# Patient Record
Sex: Female | Born: 1970 | Race: Black or African American | Hispanic: No | Marital: Single | Smoking: Former smoker
Health system: Southern US, Community
[De-identification: ages and names within clinical notes are randomized; demographics above are authoritative.]

## PROBLEM LIST (undated history)

## (undated) ENCOUNTER — Ambulatory Visit (HOSPITAL_COMMUNITY): Admission: EM | Payer: Medicaid Other | Source: Home / Self Care

## (undated) ENCOUNTER — Ambulatory Visit (HOSPITAL_COMMUNITY): Payer: Self-pay

## (undated) DIAGNOSIS — F32A Depression, unspecified: Secondary | ICD-10-CM

## (undated) DIAGNOSIS — F209 Schizophrenia, unspecified: Secondary | ICD-10-CM

## (undated) DIAGNOSIS — F419 Anxiety disorder, unspecified: Secondary | ICD-10-CM

## (undated) DIAGNOSIS — E119 Type 2 diabetes mellitus without complications: Secondary | ICD-10-CM

## (undated) DIAGNOSIS — J45909 Unspecified asthma, uncomplicated: Secondary | ICD-10-CM

## (undated) DIAGNOSIS — F329 Major depressive disorder, single episode, unspecified: Secondary | ICD-10-CM

## (undated) DIAGNOSIS — E7800 Pure hypercholesterolemia, unspecified: Secondary | ICD-10-CM

## (undated) DIAGNOSIS — K469 Unspecified abdominal hernia without obstruction or gangrene: Secondary | ICD-10-CM

---

## 2000-03-21 HISTORY — PX: TUBAL LIGATION: SHX77

## 2008-03-21 HISTORY — PX: FINGER FRACTURE SURGERY: SHX638

## 2013-03-18 ENCOUNTER — Encounter (HOSPITAL_COMMUNITY): Payer: Self-pay | Admitting: Emergency Medicine

## 2013-03-18 ENCOUNTER — Emergency Department (HOSPITAL_COMMUNITY): Payer: Medicaid Other

## 2013-03-18 ENCOUNTER — Emergency Department (HOSPITAL_COMMUNITY)
Admission: EM | Admit: 2013-03-18 | Discharge: 2013-03-18 | Disposition: A | Discharge status: Home / Self Care | Payer: Medicaid Other | Attending: Emergency Medicine | Admitting: Emergency Medicine

## 2013-03-18 DIAGNOSIS — J45901 Unspecified asthma with (acute) exacerbation: Secondary | ICD-10-CM | POA: Insufficient documentation

## 2013-03-18 DIAGNOSIS — IMO0001 Reserved for inherently not codable concepts without codable children: Secondary | ICD-10-CM | POA: Insufficient documentation

## 2013-03-18 DIAGNOSIS — Z8719 Personal history of other diseases of the digestive system: Secondary | ICD-10-CM | POA: Insufficient documentation

## 2013-03-18 DIAGNOSIS — J111 Influenza due to unidentified influenza virus with other respiratory manifestations: Secondary | ICD-10-CM | POA: Insufficient documentation

## 2013-03-18 DIAGNOSIS — F17200 Nicotine dependence, unspecified, uncomplicated: Secondary | ICD-10-CM | POA: Insufficient documentation

## 2013-03-18 DIAGNOSIS — R197 Diarrhea, unspecified: Secondary | ICD-10-CM | POA: Insufficient documentation

## 2013-03-18 DIAGNOSIS — Z79899 Other long term (current) drug therapy: Secondary | ICD-10-CM | POA: Insufficient documentation

## 2013-03-18 DIAGNOSIS — Z880 Allergy status to penicillin: Secondary | ICD-10-CM | POA: Insufficient documentation

## 2013-03-18 HISTORY — DX: Unspecified asthma, uncomplicated: J45.909

## 2013-03-18 HISTORY — DX: Unspecified abdominal hernia without obstruction or gangrene: K46.9

## 2013-03-18 MED ORDER — ALBUTEROL SULFATE (5 MG/ML) 0.5% IN NEBU
5.0000 mg | INHALATION_SOLUTION | Freq: Once | RESPIRATORY_TRACT | Status: AC
  Administered 2013-03-18: 5 mg via RESPIRATORY_TRACT
  Filled 2013-03-18: qty 1

## 2013-03-18 MED ORDER — KETOROLAC TROMETHAMINE 60 MG/2ML IM SOLN
60.0000 mg | Freq: Once | INTRAMUSCULAR | Status: AC
  Administered 2013-03-18: 60 mg via INTRAMUSCULAR
  Filled 2013-03-18: qty 2

## 2013-03-18 MED ORDER — ONDANSETRON 4 MG PO TBDP
8.0000 mg | ORAL_TABLET | Freq: Once | ORAL | Status: AC
  Administered 2013-03-18: 8 mg via ORAL
  Filled 2013-03-18: qty 2

## 2013-03-18 MED ORDER — IPRATROPIUM BROMIDE 0.02 % IN SOLN
0.5000 mg | Freq: Once | RESPIRATORY_TRACT | Status: AC
  Administered 2013-03-18: 0.5 mg via RESPIRATORY_TRACT
  Filled 2013-03-18: qty 2.5

## 2013-03-18 MED ORDER — NAPROXEN 500 MG PO TABS: 500.0000 mg | ORAL_TABLET | Freq: Two times a day (BID) | ORAL | Status: DC

## 2013-03-18 MED ORDER — ALBUTEROL SULFATE HFA 108 (90 BASE) MCG/ACT IN AERS
2.0000 | INHALATION_SPRAY | RESPIRATORY_TRACT | Status: DC | PRN
  Administered 2013-03-18: 2 via RESPIRATORY_TRACT
  Filled 2013-03-18: qty 6.7

## 2013-03-18 MED ORDER — BENZONATATE 100 MG PO CAPS: 100.0000 mg | ORAL_CAPSULE | Freq: Three times a day (TID) | ORAL | Status: DC

## 2013-03-18 NOTE — ED Notes (Signed)
Pt. Stated, I've had a fever with diarrhea since Christmas day

## 2013-03-18 NOTE — ED Provider Notes (Signed)
CSN: [id number]     Arrival date & time 03/18/13  1028 History   First MD Initiated Contact with Patient 03/18/13 1102     Chief Complaint  Patient presents with  . Abdominal Pain  . Diarrhea   (Consider location/radiation/quality/duration/timing/severity/associated sxs/prior Treatment) HPI Comments: Patient with history of asthma -- presents with complaints of myalgia, malaise, runny nose, cough, intermittent fevers starting 5 days ago. She continues to feel poorly and does not have any energy. She has been using her albuterol inhaler more frequently and is currently out. She's been using other over-the-counter cold and flu medication without much improvement. Patient states that she feels nauseous and vomits every time she eats something. She denies abdominal pain and diarrhea to me (conflicts with triage note). No urinary changes or dysuria. She has not had a flu shot this year. The onset of this condition was acute. The course is constant. Aggravating factors: none. Alleviating factors: none.    Patient is a 42 y.o. female presenting with abdominal pain and diarrhea. The history is provided by the patient.  Abdominal Pain Associated symptoms: chills, cough, fatigue, fever, nausea and vomiting   Associated symptoms: no diarrhea, no dysuria and no sore throat   Diarrhea Associated symptoms: chills, fever, myalgias and vomiting   Associated symptoms: no abdominal pain and no headaches     Past Medical History  Diagnosis Date  . Asthma   . Hernia    History reviewed. No pertinent past surgical history. No family history on file. History  Substance Use Topics  . Smoking status: Current Some Day Smoker  . Smokeless tobacco: Not on file  . Alcohol Use: Yes   OB History   Grav Para Term Preterm Abortions TAB SAB Ect Mult Living                 Review of Systems  Constitutional: Positive for fever, chills and fatigue.  HENT: Positive for congestion and rhinorrhea. Negative for  ear pain, sinus pressure and sore throat.   Eyes: Negative for redness.  Respiratory: Positive for cough and wheezing.   Cardiovascular: Negative for leg swelling.  Gastrointestinal: Positive for nausea and vomiting. Negative for abdominal pain and diarrhea.  Genitourinary: Negative for dysuria and decreased urine volume.  Musculoskeletal: Positive for myalgias. Negative for neck stiffness.  Skin: Negative for rash.  Neurological: Negative for headaches.  Hematological: Negative for adenopathy.    Allergies  Penicillins  Home Medications   Current Outpatient Rx  Name  Route  Sig  Dispense  Refill  . Phenylephrine-DM-GG-APAP (TYLENOL COLD/FLU SEVERE) 5-10-200-325 MG TABS   Oral   Take 2 tablets by mouth every 6 (six) hours as needed (for cold symptoms).         . QUEtiapine (SEROQUEL) 300 MG tablet   Oral   Take 300 mg by mouth 2 (two) times daily.          BP 105/62  Pulse 89  Temp(Src) 98.2 F (36.8 C) (Oral)  Resp 16  Wt 154 lb 3.2 oz (69.945 kg)  SpO2 98%  LMP 03/01/2013 Physical Exam  Nursing note and vitals reviewed. Constitutional: She appears well-developed and well-nourished.  HENT:  Head: Normocephalic and atraumatic.  Right Ear: Tympanic membrane, external ear and ear canal normal.  Left Ear: Tympanic membrane, external ear and ear canal normal.  Nose: Mucosal edema present. No rhinorrhea.  Mouth/Throat: Uvula is midline, oropharynx is clear and moist and mucous membranes are normal. Mucous membranes are not dry. No  oral lesions. No trismus in the jaw. No uvula swelling. No oropharyngeal exudate, posterior oropharyngeal edema, posterior oropharyngeal erythema or tonsillar abscesses.  Eyes: Conjunctivae are normal. Right eye exhibits no discharge. Left eye exhibits no discharge.  Neck: Normal range of motion. Neck supple.  Cardiovascular: Normal rate, regular rhythm and normal heart sounds.   Pulmonary/Chest: Effort normal and breath sounds normal. No  respiratory distress. She has no wheezes. She has no rales.  Abdominal: Soft. There is no tenderness.  Lymphadenopathy:    She has no cervical adenopathy.  Neurological: She is alert.  Skin: Skin is warm and dry.  Psychiatric: She has a normal mood and affect.    ED Course  Procedures (including critical care time) Labs Review Labs Reviewed - No data to display Imaging Review Dg Chest 2 View  03/18/2013   CLINICAL DATA:  Cough, fever, history of asthma  EXAM: CHEST  2 VIEW  COMPARISON:  None.  FINDINGS: Cardiomediastinal silhouette is unremarkable. No acute infiltrate or pleural effusion. No pulmonary edema. Mild degenerative changes thoracic spine.  IMPRESSION: No active cardiopulmonary disease.   Electronically Signed   By: Natasha Mead M.D.   On: 03/18/2013 12:08    EKG Interpretation   None      11:36 AM Patient seen and examined. Work-up initiated. Medications ordered. She is well appearing. Will check CXR given history of asthma. Will give breathing treatment and oral fluids. Pt d/w Dr. Fayrene Fearing.   Vital signs reviewed and are as follows: Filed Vitals:   03/18/13 1051  BP: 105/62  Pulse: 89  Temp: 98.2 F (36.8 C)  Resp: 16   1:06 PM Chest x-ray reviewed. Patient reexamined. Unchanged, lungs clear. Patient informed of all results.   Will d/c with albuterol inhaler, tessalon, naproxen. Patient counseled on use of albuterol HFA.  Told to use 1-2 puffs q 4 hours as needed for SOB.  Patient discharged to home. Encouraged to rest and drink plenty of fluids.  Patient told to return to ED or see their primary doctor if their symptoms worsen, high fever not controlled with tylenol, persistent vomiting, they feel they are dehydrated, or if they have any other concerns.  Patient verbalized understanding and agreed with plan.     MDM   1. Influenza-like illness    Patient with symptoms consistent with influenza. CXR neg, no PNA. Vitals are stable. Pt well-appearing, non-toxic. No  signs of dehydration, tolerating PO's.  Lungs are clear.  Supportive therapy indicated with return if symptoms worsen.  Patient counseled.     Renne Crigler, PA-C 03/18/13 1309

## 2013-03-20 NOTE — ED Provider Notes (Signed)
Medical screening examination/treatment/procedure(s) were performed by non-physician practitioner and as supervising physician I was immediately available for consultation/collaboration.  EKG Interpretation   None         Rolland Porter, MD 03/20/13 [phone]

## 2013-03-28 ENCOUNTER — Other Ambulatory Visit: Payer: Self-pay | Admitting: Nurse Practitioner

## 2013-03-28 DIAGNOSIS — Z1231 Encounter for screening mammogram for malignant neoplasm of breast: Secondary | ICD-10-CM

## 2013-04-02 ENCOUNTER — Ambulatory Visit: Payer: Medicaid Other

## 2013-04-03 ENCOUNTER — Ambulatory Visit
Admission: RE | Admit: 2013-04-03 | Discharge: 2013-04-03 | Disposition: A | Discharge status: Home / Self Care | Payer: Medicaid Other | Source: Ambulatory Visit | Attending: Nurse Practitioner | Admitting: Nurse Practitioner

## 2013-04-03 DIAGNOSIS — Z1231 Encounter for screening mammogram for malignant neoplasm of breast: Secondary | ICD-10-CM

## 2013-06-19 ENCOUNTER — Encounter (HOSPITAL_COMMUNITY): Payer: Self-pay | Admitting: Emergency Medicine

## 2013-06-19 ENCOUNTER — Inpatient Hospital Stay (HOSPITAL_COMMUNITY)
Admission: EM | Admit: 2013-06-19 | Discharge: 2013-06-22 | DRG: 638 | Disposition: A | Discharge status: Home / Self Care | Payer: Medicaid Other | Attending: Internal Medicine | Admitting: Internal Medicine

## 2013-06-19 DIAGNOSIS — F17200 Nicotine dependence, unspecified, uncomplicated: Secondary | ICD-10-CM | POA: Diagnosis present

## 2013-06-19 DIAGNOSIS — E119 Type 2 diabetes mellitus without complications: Secondary | ICD-10-CM | POA: Diagnosis present

## 2013-06-19 DIAGNOSIS — N390 Urinary tract infection, site not specified: Secondary | ICD-10-CM | POA: Diagnosis present

## 2013-06-19 DIAGNOSIS — E876 Hypokalemia: Secondary | ICD-10-CM | POA: Diagnosis present

## 2013-06-19 DIAGNOSIS — E1110 Type 2 diabetes mellitus with ketoacidosis without coma: Secondary | ICD-10-CM | POA: Diagnosis present

## 2013-06-19 DIAGNOSIS — E1010 Type 1 diabetes mellitus with ketoacidosis without coma: Principal | ICD-10-CM | POA: Diagnosis present

## 2013-06-19 DIAGNOSIS — F209 Schizophrenia, unspecified: Secondary | ICD-10-CM | POA: Diagnosis present

## 2013-06-19 DIAGNOSIS — R634 Abnormal weight loss: Secondary | ICD-10-CM | POA: Diagnosis present

## 2013-06-19 DIAGNOSIS — Z833 Family history of diabetes mellitus: Secondary | ICD-10-CM

## 2013-06-19 DIAGNOSIS — E785 Hyperlipidemia, unspecified: Secondary | ICD-10-CM | POA: Diagnosis present

## 2013-06-19 HISTORY — DX: Depression, unspecified: F32.A

## 2013-06-19 HISTORY — DX: Schizophrenia, unspecified: F20.9

## 2013-06-19 HISTORY — DX: Major depressive disorder, single episode, unspecified: F32.9

## 2013-06-19 HISTORY — DX: Pure hypercholesterolemia, unspecified: E78.00

## 2013-06-19 HISTORY — DX: Anxiety disorder, unspecified: F41.9

## 2013-06-19 HISTORY — DX: Type 2 diabetes mellitus without complications: E11.9

## 2013-06-19 LAB — CBC WITH DIFFERENTIAL/PLATELET
Basophils Absolute: 0 10*3/uL (ref 0.0–0.1)
Basophils Relative: 0 % (ref 0–1)
Eosinophils Absolute: 0.1 10*3/uL (ref 0.0–0.7)
Eosinophils Relative: 2 % (ref 0–5)
HEMATOCRIT: 37.6 % (ref 36.0–46.0)
Hemoglobin: 13.2 g/dL (ref 12.0–15.0)
LYMPHS ABS: 2.9 10*3/uL (ref 0.7–4.0)
Lymphocytes Relative: 48 % — ABNORMAL HIGH (ref 12–46)
MCH: 31.3 pg (ref 26.0–34.0)
MCHC: 35.1 g/dL (ref 30.0–36.0)
MCV: 89.1 fL (ref 78.0–100.0)
Monocytes Absolute: 0.3 10*3/uL (ref 0.1–1.0)
Monocytes Relative: 6 % (ref 3–12)
NEUTROS PCT: 44 % (ref 43–77)
Neutro Abs: 2.7 10*3/uL (ref 1.7–7.7)
Platelets: 221 10*3/uL (ref 150–400)
RBC: 4.22 MIL/uL (ref 3.87–5.11)
RDW: 12.4 % (ref 11.5–15.5)
WBC: 6.1 10*3/uL (ref 4.0–10.5)

## 2013-06-19 LAB — COMPREHENSIVE METABOLIC PANEL
ALK PHOS: 102 U/L (ref 39–117)
ALT: 12 U/L (ref 0–35)
AST: 21 U/L (ref 0–37)
Albumin: 4.2 g/dL (ref 3.5–5.2)
BILIRUBIN TOTAL: 0.4 mg/dL (ref 0.3–1.2)
BUN: 16 mg/dL (ref 6–23)
CO2: 20 mEq/L (ref 19–32)
Calcium: 9.8 mg/dL (ref 8.4–10.5)
Chloride: 87 mEq/L — ABNORMAL LOW (ref 96–112)
Creatinine, Ser: 0.88 mg/dL (ref 0.50–1.10)
GFR calc non Af Amer: 81 mL/min — ABNORMAL LOW (ref >90)
GLUCOSE: 695 mg/dL — AB (ref 70–99)
POTASSIUM: 5 meq/L (ref 3.7–5.3)
Sodium: 126 mEq/L — ABNORMAL LOW (ref 137–147)
TOTAL PROTEIN: 7.6 g/dL (ref 6.0–8.3)

## 2013-06-19 LAB — BASIC METABOLIC PANEL
BUN: 11 mg/dL (ref 6–23)
CALCIUM: 8.5 mg/dL (ref 8.4–10.5)
CO2: 21 meq/L (ref 19–32)
CREATININE: 0.75 mg/dL (ref 0.50–1.10)
Chloride: 106 mEq/L (ref 96–112)
GFR calc Af Amer: >90 mL/min (ref >90)
GFR calc non Af Amer: >90 mL/min (ref >90)
Glucose, Bld: 157 mg/dL — ABNORMAL HIGH (ref 70–99)
Potassium: 3.9 mEq/L (ref 3.7–5.3)
Sodium: 142 mEq/L (ref 137–147)

## 2013-06-19 LAB — URINALYSIS, ROUTINE W REFLEX MICROSCOPIC
Bilirubin Urine: NEGATIVE
Glucose, UA: >1000 mg/dL — AB
KETONES UR: 40 mg/dL — AB
Leukocytes, UA: NEGATIVE
NITRITE: NEGATIVE
Protein, ur: NEGATIVE mg/dL
SPECIFIC GRAVITY, URINE: 1.034 — AB (ref 1.005–1.030)
UROBILINOGEN UA: 0.2 mg/dL (ref 0.0–1.0)
pH: 5 (ref 5.0–8.0)

## 2013-06-19 LAB — URINE MICROSCOPIC-ADD ON

## 2013-06-19 LAB — CBG MONITORING, ED
GLUCOSE-CAPILLARY: 345 mg/dL — AB (ref 70–99)
Glucose-Capillary: >600 mg/dL (ref 70–99)
Glucose-Capillary: 530 mg/dL — ABNORMAL HIGH (ref 70–99)
Glucose-Capillary: 400 mg/dL — ABNORMAL HIGH (ref 70–99)

## 2013-06-19 LAB — GLUCOSE, CAPILLARY
Glucose-Capillary: 227 mg/dL — ABNORMAL HIGH (ref 70–99)
Glucose-Capillary: 145 mg/dL — ABNORMAL HIGH (ref 70–99)

## 2013-06-19 MED ORDER — POTASSIUM CHLORIDE 10 MEQ/100ML IV SOLN
10.0000 meq | INTRAVENOUS | Status: AC
  Administered 2013-06-19 – 2013-06-20 (×2): 10 meq via INTRAVENOUS
  Filled 2013-06-19 (×2): qty 100

## 2013-06-19 MED ORDER — QUETIAPINE FUMARATE 400 MG PO TABS
400.0000 mg | ORAL_TABLET | Freq: Two times a day (BID) | ORAL | Status: DC
  Administered 2013-06-19 – 2013-06-22 (×6): 400 mg via ORAL
  Filled 2013-06-19 (×7): qty 1

## 2013-06-19 MED ORDER — SODIUM CHLORIDE 0.9 % IV SOLN
INTRAVENOUS | Status: DC
  Administered 2013-06-19: 4.7 [IU]/h via INTRAVENOUS
  Filled 2013-06-19: qty 1

## 2013-06-19 MED ORDER — ATORVASTATIN CALCIUM 20 MG PO TABS
20.0000 mg | ORAL_TABLET | Freq: Every day | ORAL | Status: DC
  Administered 2013-06-19 – 2013-06-22 (×4): 20 mg via ORAL
  Filled 2013-06-19 (×4): qty 1

## 2013-06-19 MED ORDER — DEXTROSE-NACL 5-0.45 % IV SOLN
INTRAVENOUS | Status: DC
  Administered 2013-06-19: 22:00:00 via INTRAVENOUS

## 2013-06-19 MED ORDER — SODIUM CHLORIDE 0.9 % IV SOLN
INTRAVENOUS | Status: DC
  Administered 2013-06-19: 1.7 [IU]/h via INTRAVENOUS
  Filled 2013-06-19: qty 1

## 2013-06-19 MED ORDER — SODIUM CHLORIDE 0.9 % IV SOLN
1000.0000 mL | Freq: Once | INTRAVENOUS | Status: AC
  Administered 2013-06-19: 1000 mL via INTRAVENOUS

## 2013-06-19 MED ORDER — DEXTROSE 50 % IV SOLN: 25.0000 mL | INTRAVENOUS | Status: DC | PRN

## 2013-06-19 MED ORDER — CIPROFLOXACIN IN D5W 400 MG/200ML IV SOLN
400.0000 mg | Freq: Two times a day (BID) | INTRAVENOUS | Status: DC
  Administered 2013-06-20 – 2013-06-21 (×4): 400 mg via INTRAVENOUS
  Filled 2013-06-19 (×5): qty 200

## 2013-06-19 MED ORDER — ENOXAPARIN SODIUM 40 MG/0.4ML ~~LOC~~ SOLN
40.0000 mg | SUBCUTANEOUS | Status: DC
  Administered 2013-06-19 – 2013-06-21 (×3): 40 mg via SUBCUTANEOUS
  Filled 2013-06-19 (×4): qty 0.4

## 2013-06-19 MED ORDER — SODIUM CHLORIDE 0.9 % IV SOLN: 1000.0000 mL | Freq: Once | INTRAVENOUS | Status: DC

## 2013-06-19 MED ORDER — SODIUM CHLORIDE 0.9 % IV SOLN: 1000.0000 mL | INTRAVENOUS | Status: DC

## 2013-06-19 MED ORDER — SODIUM CHLORIDE 0.9 % IV SOLN: INTRAVENOUS | Status: DC

## 2013-06-19 NOTE — ED Notes (Addendum)
Pt in via EMS to triage from her PCP office, went there due to frequent urination and excessive thirst, states they found ketones in her urine and her CBG was 538 in the office, pt with no known history of diabetes, sent here for further evaluation , pt alert and oriented, ambulatory without distress

## 2013-06-19 NOTE — ED Provider Notes (Signed)
CSN: [id number]     Arrival date & time 06/19/13  1746 History   First MD Initiated Contact with Patient 06/19/13 1841     Chief Complaint  Patient presents with  . Hyperglycemia     (Consider location/radiation/quality/duration/timing/severity/associated sxs/prior Treatment) HPI  Patient reports for the past 3 weeks she has had polyuria, polydipsia, with dryness of her skin and dry tongue. She states she has body aches. She denies chest pain, shortness of breath, nausea, vomiting, or diarrhea. She's had some mild diffuse abdominal pain. She states she's having nocturia x7 night. She went to her doctor's office today for urinary complaints and that showed she had large glucose in her urine. She had also lost weight from 167 pounds to 159 pounds today at her doctors office. She was sent to the ED for further treatment. Patient denies having diabetes during pregnancy or in the past. She states her mother died from diabetes, and her father, aunt, and 2 cousins also have diabetes.  [facility]  Past Medical History  Diagnosis Date  . Asthma   . Hernia    History reviewed. No pertinent past surgical history. History reviewed. No pertinent family history. History  Substance Use Topics  . Smoking status: Current Some Day Smoker  . Smokeless tobacco: Not on file  . Alcohol Use: Yes   Patient smokes 5 cigarettes a month Patient states she has stopped drinking Patient on disability for schizophrenia  OB History   Grav Para Term Preterm Abortions TAB SAB Ect Mult Living                 Review of Systems  All other systems reviewed and are negative.      Allergies  Penicillins  Home Medications   Current Outpatient Rx  Name  Route  Sig  Dispense  Refill  . atorvastatin (LIPITOR) 20 MG tablet   Oral   Take 20 mg by mouth daily.         . QUEtiapine (SEROQUEL) 400 MG tablet   Oral   Take 400 mg by mouth 2 (two) times daily.          BP 104/71  Pulse 77   Temp(Src) 97.8 F (36.6 C) (Oral)  Resp 18  Ht 5\' 6"  (1.676 m)  Wt 159 lb (72.122 kg)  BMI 25.68 kg/m2  SpO2 100% Physical Exam  Nursing note and vitals reviewed. Constitutional: She is oriented to person, place, and time. She appears well-developed and well-nourished.  Non-toxic appearance. She does not appear ill. No distress.  HENT:  Head: Normocephalic and atraumatic.  Right Ear: External ear normal.  Left Ear: External ear normal.  Nose: Nose normal. No mucosal edema or rhinorrhea.  Mouth/Throat: Mucous membranes are normal. No dental abscesses or uvula swelling.  Dry tongue  Eyes: Conjunctivae and EOM are normal. Pupils are equal, round, and reactive to light.  Neck: Normal range of motion and full passive range of motion without pain. Neck supple.  Cardiovascular: Normal rate, regular rhythm and normal heart sounds.  Exam reveals no gallop and no friction rub.   No murmur heard. Pulmonary/Chest: Effort normal and breath sounds normal. No respiratory distress. She has no wheezes. She has no rhonchi. She has no rales. She exhibits no tenderness and no crepitus.  Abdominal: Soft. Normal appearance and bowel sounds are normal. She exhibits no distension. There is no tenderness. There is no rebound and no guarding.  Musculoskeletal: Normal range of motion. She exhibits no  edema and no tenderness.  Moves all extremities well.   Neurological: She is alert and oriented to person, place, and time. She has normal strength. No cranial nerve deficit.  Skin: Skin is warm, dry and intact. No rash noted. No erythema. No pallor.  Psychiatric: She has a normal mood and affect. Her speech is normal and behavior is normal. Her mood appears not anxious.    ED Course  Procedures (including critical care time)  Medications  0.9 %  sodium chloride infusion (not administered)    Followed by  0.9 %  sodium chloride infusion (1,000 mLs Intravenous New Bag/Given 06/19/13 1911)    Followed by  0.9 %   sodium chloride infusion (not administered)  insulin regular (NOVOLIN R,HUMULIN R) 1 Units/mL in sodium chloride 0.9 % 100 mL infusion (4.7 Units/hr Intravenous New Bag/Given 06/19/13 1925)   Pt started on an insulin drip.  She is agreeable to be admitted  19:55 Dr Allena KatzPatel, admit to tele, team 10   Labs Review Results for orders placed during the hospital encounter of 06/19/13  CBC WITH DIFFERENTIAL      Result Value Ref Range   WBC 6.1  4.0 - 10.5 K/uL   RBC 4.22  3.87 - 5.11 MIL/uL   Hemoglobin 13.2  12.0 - 15.0 g/dL   HCT 46.937.6  62.936.0 - 52.846.0 %   MCV 89.1  78.0 - 100.0 fL   MCH 31.3  26.0 - 34.0 pg   MCHC 35.1  30.0 - 36.0 g/dL   RDW 41.312.4  24.411.5 - 01.015.5 %   Platelets 221  150 - 400 K/uL   Neutrophils Relative % 44  43 - 77 %   Neutro Abs 2.7  1.7 - 7.7 K/uL   Lymphocytes Relative 48 (*) 12 - 46 %   Lymphs Abs 2.9  0.7 - 4.0 K/uL   Monocytes Relative 6  3 - 12 %   Monocytes Absolute 0.3  0.1 - 1.0 K/uL   Eosinophils Relative 2  0 - 5 %   Eosinophils Absolute 0.1  0.0 - 0.7 K/uL   Basophils Relative 0  0 - 1 %   Basophils Absolute 0.0  0.0 - 0.1 K/uL  COMPREHENSIVE METABOLIC PANEL      Result Value Ref Range   Sodium 126 (*) 137 - 147 mEq/L   Potassium 5.0  3.7 - 5.3 mEq/L   Chloride 87 (*) 96 - 112 mEq/L   CO2 20  19 - 32 mEq/L   Glucose, Bld 695 (*) 70 - 99 mg/dL   BUN 16  6 - 23 mg/dL   Creatinine, Ser 2.720.88  0.50 - 1.10 mg/dL   Calcium 9.8  8.4 - 53.610.5 mg/dL   Total Protein 7.6  6.0 - 8.3 g/dL   Albumin 4.2  3.5 - 5.2 g/dL   AST 21  0 - 37 U/L   ALT 12  0 - 35 U/L   Alkaline Phosphatase 102  39 - 117 U/L   Total Bilirubin 0.4  0.3 - 1.2 mg/dL   GFR calc non Af Amer 81 (*) >90 mL/min   GFR calc Af Amer >90  >90 mL/min  URINALYSIS, ROUTINE W REFLEX MICROSCOPIC      Result Value Ref Range   Color, Urine YELLOW  YELLOW   APPearance CLOUDY (*) CLEAR   Specific Gravity, Urine 1.034 (*) 1.005 - 1.030   pH 5.0  5.0 - 8.0   Glucose, UA >1000 (*) NEGATIVE mg/dL  Hgb urine  dipstick TRACE (*) NEGATIVE   Bilirubin Urine NEGATIVE  NEGATIVE   Ketones, ur 40 (*) NEGATIVE mg/dL   Protein, ur NEGATIVE  NEGATIVE mg/dL   Urobilinogen, UA 0.2  0.0 - 1.0 mg/dL   Nitrite NEGATIVE  NEGATIVE   Leukocytes, UA NEGATIVE  NEGATIVE  URINE MICROSCOPIC-ADD ON      Result Value Ref Range   Squamous Epithelial / LPF RARE  RARE   WBC, UA 11-20  <3 WBC/hpf   RBC / HPF 0-2  <3 RBC/hpf   Bacteria, UA FEW (*) RARE  CBG MONITORING, ED      Result Value Ref Range   Glucose-Capillary >600 (*) 70 - 99 mg/dL  CBG MONITORING, ED      Result Value Ref Range   Glucose-Capillary 530 (*) 70 - 99 mg/dL   Laboratory interpretation all normal except hyperglycemia with anion gap of 19    Imaging Review No results found.   EKG Interpretation None      MDM   Final diagnoses:  DKA (diabetic ketoacidoses)  Diabetes mellitus, new onset     Plan admission   CRITICAL CARE Performed by: Devoria Albe L Total critical care time: 34 min Critical care time was exclusive of separately billable procedures and treating other patients. Critical care was necessary to treat or prevent imminent or life-threatening deterioration. Critical care was time spent personally by me on the following activities: development of treatment plan with patient and/or surrogate as well as nursing, discussions with consultants, evaluation of patient's response to treatment, examination of patient, obtaining history from patient or surrogate, ordering and performing treatments and interventions, ordering and review of laboratory studies, ordering and review of radiographic studies, pulse oximetry and re-evaluation of patient's condition.    Ward Givens, MD 06/19/13 2000

## 2013-06-19 NOTE — ED Notes (Signed)
Report attempted x 1

## 2013-06-19 NOTE — H&P (Signed)
[facility] History and Physical  Patient: Meghan Barrett  ONG:[id number]  DOB: 25-Mar-1970  DOS: the patient was seen and examined on   06/19/2013 PCP: Default, Provider, MD  Chief Complaint: Polyuria and muscle cramps  HPI: Meghan Barrett is a 43 y.o. female with Past medical history of schizophrenia. Patient presented with complaints of polyuria that is present since last 3 weeks initially she also complains of some burning urination which has improved. Since last one week she started having complaints of muscle cramps both in her upper and lower extremity. Her symptoms were progressively worsening and her pain was getting worse and therefore she went to see her doctor. They did urine analysis and checked her sugar and it was elevated therefore sent her here. Patient denies any prior history of diabetes, she does mention about significant family history of diabetes she also has type 1 diabetes in her first cousin. She denies any nausea or vomiting abdominal pain fever or chills diarrhea or constipation. She has not made any significant change in her medication she's not taking any over-the-counter medications denies any alcohol or drug abuse.  she initially thought that this is seroquel causing her muscle cramps therefore she stopped taking it. She mentions she was recently few months ago started on Lipitor as her cholesterol was in 400s.  The patient is coming from home. And at her baseline Independent for most of her  ADL.  Review of Systems: as mentioned in the history of present illness.  A Comprehensive review of the other systems is negative.  Past Medical History  Diagnosis Date  . Asthma   . Hernia    History reviewed. No pertinent past surgical history. Social History:  reports that she has been smoking.  She does not have any smokeless tobacco history on file. She reports that she drinks alcohol. She reports that she does not use illicit drugs.  Allergies  Allergen  Reactions  . Penicillins Anaphylaxis    History reviewed. No pertinent family history.  Prior to Admission medications   Medication Sig Start Date End Date Taking? Authorizing Provider  atorvastatin (LIPITOR) 20 MG tablet Take 20 mg by mouth daily.   Yes Historical Provider, MD  QUEtiapine (SEROQUEL) 400 MG tablet Take 400 mg by mouth 2 (two) times daily.   Yes Historical Provider, MD    Physical Exam: Filed Vitals:   06/19/13 1945 06/19/13 1953 06/19/13 2000 06/19/13 2130  BP: 104/71  110/75 120/72  Pulse:  77 77 92  Temp:    98 F (36.7 C)  TempSrc:    Oral  Resp:    18  Height:    5\' 6"  (1.676 m)  Weight:    68.765 kg (151 lb 9.6 oz)  SpO2:  100% 100% 100%    General: Alert, Awake and Oriented to Time, Place and Person. Appear in mild distress Eyes: PERRL ENT: Oral Mucosa clear moist. Neck: no JVD Cardiovascular: S1 and S2 Present, no Murmur, Peripheral Pulses Present Respiratory: Bilateral Air entry equal and Decreased, Clear to Auscultation,  no Crackles,no wheezes Abdomen: Bowel Sound Present, Soft and Non tender Skin: no Rash Extremities: no Pedal edema, no calf tenderness Neurologic: Grossly Unremarkable.  Labs on Admission:  CBC:  Recent Labs Lab 06/19/13 1752  WBC 6.1  NEUTROABS 2.7  HGB 13.2  HCT 37.6  MCV 89.1  PLT 221    CMP     Component Value Date/Time   NA 126* 06/19/2013 1752   K 5.0 06/19/2013 1752  CL 87* 06/19/2013 1752   CO2 20 06/19/2013 1752   GLUCOSE 695* 06/19/2013 1752   BUN 16 06/19/2013 1752   CREATININE 0.88 06/19/2013 1752   CALCIUM 9.8 06/19/2013 1752   PROT 7.6 06/19/2013 1752   ALBUMIN 4.2 06/19/2013 1752   AST 21 06/19/2013 1752   ALT 12 06/19/2013 1752   ALKPHOS 102 06/19/2013 1752   BILITOT 0.4 06/19/2013 1752   GFRNONAA 81* 06/19/2013 1752   GFRAA >90 06/19/2013 1752    No results found for this basename: LIPASE, AMYLASE,  in the last 168 hours No results found for this basename: AMMONIA,  in the last 168 hours  No results found for  this basename: CKTOTAL, CKMB, CKMBINDEX, TROPONINI,  in the last 168 hours BNP (last 3 results) No results found for this basename: PROBNP,  in the last 8760 hours  Radiological Exams on Admission: No results found.   Assessment/Plan Principal Problem:   DKA (diabetic ketoacidoses) Active Problems:   Dyslipidemia   Schizophrenia   UTI (urinary tract infection)   Diabetes mellitus   Family history of diabetes mellitus type I   1. DKA (diabetic ketoacidoses) The patient presents with hyperglycemia, polyuria, burning urination, ketones in her urine. She has low bicarbonate of 18 and anion gap of 28 after correcting her sodium her glucose. Thus she meets the criteria for DKA With this she is hemodynamically stable. I would admit her to telemetry for DKA. Patient will be started on insulin drip with glucose stabilizer. Continue aggressive hydration and monitor BMP every 2 hours for anion gap. Correcting for relative hypokalemia. She may require consultation with diabetic educator for postdischarge planning for diabetes care. Recheck hemoglobin A1c and anti-gad 65.  2. possible UTI Treat her with Cipro  3. Schizophrenia Continue Seroquel.  4. Dyslipidemia Continue Lipitor.  DVT Prophylaxis: subcutaneous Heparin Nutrition: N.p.o. except meds  Code Status: Full  Disposition: Admitted to inpatient in telemetry unit.  Author: Lynden OxfordPranav Crucita Lacorte, MD [facility] Pager: [phone] 06/19/2013, 9:58 PM    If 7PM-7AM, please contact night-coverage [email or web address] Password [email or web address]

## 2013-06-19 NOTE — Progress Notes (Signed)
Meghan BaleFatima Barrett [id number] Admitted to 6E11 06/19/2013 9:34 PM Attending Provider: Lynden OxfordPranav Patel, MD   History:  obtained from the patient.  Pt orientation to unit, room and routine. Information packet given to patient and safety video watched.  Admission INP armband ID verified with patient and in place. Side rails in place, fall risk assessment complete with patient verbalizing understanding of risks associated with falls. Pt verbalizes an understanding of how to use the call bell and to call for help before getting out of bed.   Will cont to monitor and assist as needed.  Gilman Schmidtembrina, [name] J, RN 06/19/2013 9:34 PM

## 2013-06-20 DIAGNOSIS — N390 Urinary tract infection, site not specified: Secondary | ICD-10-CM

## 2013-06-20 DIAGNOSIS — E119 Type 2 diabetes mellitus without complications: Secondary | ICD-10-CM

## 2013-06-20 DIAGNOSIS — E1110 Type 2 diabetes mellitus with ketoacidosis without coma: Secondary | ICD-10-CM

## 2013-06-20 DIAGNOSIS — F209 Schizophrenia, unspecified: Secondary | ICD-10-CM

## 2013-06-20 LAB — GLUCOSE, CAPILLARY
GLUCOSE-CAPILLARY: 190 mg/dL — AB (ref 70–99)
GLUCOSE-CAPILLARY: 182 mg/dL — AB (ref 70–99)
GLUCOSE-CAPILLARY: 159 mg/dL — AB (ref 70–99)
GLUCOSE-CAPILLARY: 137 mg/dL — AB (ref 70–99)
GLUCOSE-CAPILLARY: 214 mg/dL — AB (ref 70–99)
GLUCOSE-CAPILLARY: 217 mg/dL — AB (ref 70–99)
GLUCOSE-CAPILLARY: 185 mg/dL — AB (ref 70–99)
GLUCOSE-CAPILLARY: 114 mg/dL — AB (ref 70–99)
GLUCOSE-CAPILLARY: 258 mg/dL — AB (ref 70–99)
Glucose-Capillary: 165 mg/dL — ABNORMAL HIGH (ref 70–99)
Glucose-Capillary: 126 mg/dL — ABNORMAL HIGH (ref 70–99)
Glucose-Capillary: 135 mg/dL — ABNORMAL HIGH (ref 70–99)
Glucose-Capillary: 136 mg/dL — ABNORMAL HIGH (ref 70–99)
Glucose-Capillary: 196 mg/dL — ABNORMAL HIGH (ref 70–99)
Glucose-Capillary: 147 mg/dL — ABNORMAL HIGH (ref 70–99)
Glucose-Capillary: 174 mg/dL — ABNORMAL HIGH (ref 70–99)
Glucose-Capillary: 124 mg/dL — ABNORMAL HIGH (ref 70–99)
Glucose-Capillary: 138 mg/dL — ABNORMAL HIGH (ref 70–99)
Glucose-Capillary: 202 mg/dL — ABNORMAL HIGH (ref 70–99)
Glucose-Capillary: 83 mg/dL (ref 70–99)
Glucose-Capillary: 107 mg/dL — ABNORMAL HIGH (ref 70–99)

## 2013-06-20 LAB — BASIC METABOLIC PANEL
BUN: 10 mg/dL (ref 6–23)
BUN: 7 mg/dL (ref 6–23)
BUN: 6 mg/dL (ref 6–23)
BUN: 5 mg/dL — ABNORMAL LOW (ref 6–23)
CALCIUM: 8.8 mg/dL (ref 8.4–10.5)
CALCIUM: 9 mg/dL (ref 8.4–10.5)
CHLORIDE: 106 meq/L (ref 96–112)
CO2: 18 meq/L — AB (ref 19–32)
CO2: 22 mEq/L (ref 19–32)
CO2: 18 meq/L — AB (ref 19–32)
CO2: 22 meq/L (ref 19–32)
CREATININE: 0.66 mg/dL (ref 0.50–1.10)
Calcium: 8.5 mg/dL (ref 8.4–10.5)
Calcium: 8.3 mg/dL — ABNORMAL LOW (ref 8.4–10.5)
Chloride: 104 mEq/L (ref 96–112)
Chloride: 108 mEq/L (ref 96–112)
Chloride: 108 mEq/L (ref 96–112)
Creatinine, Ser: 0.7 mg/dL (ref 0.50–1.10)
Creatinine, Ser: 0.69 mg/dL (ref 0.50–1.10)
Creatinine, Ser: 0.69 mg/dL (ref 0.50–1.10)
GFR calc Af Amer: >90 mL/min (ref >90)
GFR calc Af Amer: >90 mL/min (ref >90)
GFR calc Af Amer: >90 mL/min (ref >90)
GFR calc Af Amer: >90 mL/min (ref >90)
GFR calc non Af Amer: >90 mL/min (ref >90)
GFR calc non Af Amer: >90 mL/min (ref >90)
GLUCOSE: 190 mg/dL — AB (ref 70–99)
GLUCOSE: 129 mg/dL — AB (ref 70–99)
Glucose, Bld: 188 mg/dL — ABNORMAL HIGH (ref 70–99)
Glucose, Bld: 132 mg/dL — ABNORMAL HIGH (ref 70–99)
POTASSIUM: 3.6 meq/L — AB (ref 3.7–5.3)
POTASSIUM: 3.6 meq/L — AB (ref 3.7–5.3)
Potassium: 3.6 mEq/L — ABNORMAL LOW (ref 3.7–5.3)
Potassium: 4.2 mEq/L (ref 3.7–5.3)
SODIUM: 140 meq/L (ref 137–147)
SODIUM: 143 meq/L (ref 137–147)
SODIUM: 139 meq/L (ref 137–147)
SODIUM: 142 meq/L (ref 137–147)

## 2013-06-20 LAB — HEMOGLOBIN A1C
Hgb A1c MFr Bld: 12.6 % — ABNORMAL HIGH (ref <5.7)
MEAN PLASMA GLUCOSE: 315 mg/dL — AB (ref <117)

## 2013-06-20 MED ORDER — INSULIN GLARGINE 100 UNIT/ML ~~LOC~~ SOLN
15.0000 [IU] | Freq: Every day | SUBCUTANEOUS | Status: DC
  Administered 2013-06-20 – 2013-06-21 (×2): 15 [IU] via SUBCUTANEOUS
  Filled 2013-06-20 (×4): qty 0.15

## 2013-06-20 MED ORDER — LIVING WELL WITH DIABETES BOOK
Freq: Once | Status: DC
  Filled 2013-06-20: qty 1

## 2013-06-20 MED ORDER — POTASSIUM CHLORIDE 10 MEQ/100ML IV SOLN
10.0000 meq | INTRAVENOUS | Status: AC
  Administered 2013-06-20 (×3): 10 meq via INTRAVENOUS
  Filled 2013-06-20 (×3): qty 100

## 2013-06-20 MED ORDER — SODIUM CHLORIDE 0.9 % IV SOLN
INTRAVENOUS | Status: DC
  Administered 2013-06-20 – 2013-06-21 (×2): via INTRAVENOUS
  Administered 2013-06-22: 100 mL/h via INTRAVENOUS

## 2013-06-20 MED ORDER — INSULIN ASPART 100 UNIT/ML ~~LOC~~ SOLN
0.0000 [IU] | SUBCUTANEOUS | Status: DC
  Administered 2013-06-20: 5 [IU] via SUBCUTANEOUS
  Administered 2013-06-21: 1 [IU] via SUBCUTANEOUS
  Administered 2013-06-21: 2 [IU] via SUBCUTANEOUS
  Administered 2013-06-21: 7 [IU] via SUBCUTANEOUS
  Administered 2013-06-21: 3 [IU] via SUBCUTANEOUS
  Administered 2013-06-21 – 2013-06-22 (×2): 5 [IU] via SUBCUTANEOUS
  Administered 2013-06-22: 1 [IU] via SUBCUTANEOUS
  Administered 2013-06-22: 9 [IU] via SUBCUTANEOUS

## 2013-06-20 NOTE — Progress Notes (Signed)
Spoke with patient.  States that her mother died of diabetes at age 166.  She has a cousin that has Type 2 diabetes.  Had been having pain in right foot, along with frequent urination and thirst that sent her to the doctor.  Has never been told that she had elevated blood sugars.  Will have staff RNs have patient watch DM videos, give patient DM [place] notes, teach patient insulin administration and how to check CBGs.  Will have dietician see patient and case management to check on patient's health insurance and make sure that she has a PCP to follow her after discharge.  Will need outpatient DM education.  Patient states that she has [employer] insurance.  Will continue to follow while in hospital.  Smith MinceKendra Solash Tullo RN BSN CDE

## 2013-06-20 NOTE — Progress Notes (Signed)
Utilization review completed. Gracious Renken, RN, BSN. 

## 2013-06-20 NOTE — Progress Notes (Signed)
Inpatient Diabetes Program Recommendations  AACE/ADA: New Consensus Statement on Inpatient Glycemic Control (2013)  Target Ranges:  Prepandial:   less than 140 mg/dL      Peak postprandial:   less than 180 mg/dL (1-2 hours)      Critically ill patients:  140 - 180 mg/dL   Reason for Visit: Diabetes Coordinator consult  Diabetes history: none Outpatient Diabetes medications: none Current orders for Inpatient glycemic control: on IV insulin drip   Note: Patient admitted with DKA.  CBG on admission was 695 mg/dl. Started on IV insulin drip glucostabilizer.  CO2 currently is 22.  Gap is now 11. Recommend giving patient Lantus 15 units daily and Novolog SENSITIVE correction scale TID & HS when transitioning off drip.  Be sure that Lantus is given 2 hours before before stopping drip and giving Novolog as soon as drip is stopped.  Plan to talk with patient later today.  Will continue to follow while in hospital.  Smith MinceKendra Kade Demicco RN BSN CDE

## 2013-06-20 NOTE — Progress Notes (Addendum)
[facility] PROGRESS NOTE  Mancel BaleFatima Isaacs ZOX:[id number] DOB: February 23, 1971 DOA: 06/19/2013 PCP: Default, Provider, MD  Assessment/Plan: 1.DKA/ New onset DM -Continue IV insulin via glucostabilizer till AG closes and meets BG criteria as per protocol -DM teaching per DM coordinator -her A1C is 12.6, she will need to stay on lantus/insulin on d/c 2. possible UTI  Obtain culture, continue Cipro  3. Schizophrenia  Continue Seroquel.  4. Dyslipidemia  Continue Lipitor.   Code Status: full Family Communication: none at bedside  Disposition Plan: to home when medically ready   Consultants:  none  Procedures:  none  Antibiotics:  none   HPI/Subjective: She denies any c/o  Objective: Filed Vitals:   06/20/13 1813  BP: 90/62  Pulse: 75  Temp: 97.8 F (36.6 C)  Resp: 18    Intake/Output Summary (Last 24 hours) at 06/20/13 1852 Last data filed at 06/20/13 1800  Gross per 24 hour  Intake   1600 ml  Output      0 ml  Net   1600 ml   Filed Weights   06/19/13 1749 06/19/13 2130  Weight: 72.122 kg (159 lb) 68.765 kg (151 lb 9.6 oz)    Exam:  General: alert & oriented x 3 In NAD Cardiovascular: RRR, nl S1 s2 Respiratory: CTAB Abdomen: soft +BS NT/ND, no masses palpable Extremities: No cyanosis and no edema     Data Reviewed: Basic Metabolic Panel:  Recent Labs Lab 06/19/13 2245 06/20/13 0124 06/20/13 0700 06/20/13 1400 06/20/13 1600  NA 142 140 143 139 142  K 3.9 3.6* 3.6* 4.2 3.6*  CL 106 104 108 106 108  CO2 21 18* 22 18* 22  GLUCOSE 157* 190* 129* 188* 132*  BUN 11 10 7 6  5*  CREATININE 0.75 0.70 0.66 0.69 0.69  CALCIUM 8.5 8.5 8.3* 8.8 9.0   Liver Function Tests:  Recent Labs Lab 06/19/13 1752  AST 21  ALT 12  ALKPHOS 102  BILITOT 0.4  PROT 7.6  ALBUMIN 4.2   No results found for this basename: LIPASE, AMYLASE,  in the last 168 hours No results found for this basename: AMMONIA,  in the last 168 hours CBC:  Recent Labs Lab  06/19/13 1752  WBC 6.1  NEUTROABS 2.7  HGB 13.2  HCT 37.6  MCV 89.1  PLT 221   Cardiac Enzymes: No results found for this basename: CKTOTAL, CKMB, CKMBINDEX, TROPONINI,  in the last 168 hours BNP (last 3 results) No results found for this basename: PROBNP,  in the last 8760 hours CBG:  Recent Labs Lab 06/20/13 1443 06/20/13 1547 06/20/13 1655 06/20/13 1759 06/20/13 1846  GLUCAP 185* 138* 124* 83 107*    No results found for this or any previous visit (from the past 240 hour(s)).   Studies: No results found.  Scheduled Meds: . atorvastatin  20 mg Oral Daily  . ciprofloxacin  400 mg Intravenous Q12H  . enoxaparin (LOVENOX) injection  40 mg Subcutaneous Q24H  . living well with diabetes book   Does not apply Once  . QUEtiapine  400 mg Oral BID   Continuous Infusions: . sodium chloride    . dextrose 5 % and 0.45% NaCl 100 mL/hr at 06/19/13 2142  . insulin (NOVOLIN-R) infusion 0.5 Units/hr (06/20/13 1850)    Principal Problem:   DKA (diabetic ketoacidoses) Active Problems:   Dyslipidemia   Schizophrenia   UTI (urinary tract infection)   Diabetes mellitus   Family history of diabetes mellitus type I    Time  spent: 35    [facility] C  [facility] Pager [phone]. If 7PM-7AM, please contact night-coverage at [email or web address], password [facility] 06/20/2013, 6:52 PM  LOS: 1 day

## 2013-06-21 LAB — GLUCOSE, CAPILLARY
GLUCOSE-CAPILLARY: 334 mg/dL — AB (ref 70–99)
Glucose-Capillary: 155 mg/dL — ABNORMAL HIGH (ref 70–99)
Glucose-Capillary: 136 mg/dL — ABNORMAL HIGH (ref 70–99)
Glucose-Capillary: 239 mg/dL — ABNORMAL HIGH (ref 70–99)
Glucose-Capillary: 402 mg/dL — ABNORMAL HIGH (ref 70–99)
Glucose-Capillary: 369 mg/dL — ABNORMAL HIGH (ref 70–99)
Glucose-Capillary: 272 mg/dL — ABNORMAL HIGH (ref 70–99)

## 2013-06-21 LAB — BASIC METABOLIC PANEL
BUN: 7 mg/dL (ref 6–23)
CHLORIDE: 106 meq/L (ref 96–112)
CO2: 23 meq/L (ref 19–32)
Calcium: 8.7 mg/dL (ref 8.4–10.5)
Creatinine, Ser: 0.99 mg/dL (ref 0.50–1.10)
GFR calc non Af Amer: 70 mL/min — ABNORMAL LOW (ref >90)
GFR, EST AFRICAN AMERICAN: 82 mL/min — AB (ref >90)
Glucose, Bld: 138 mg/dL — ABNORMAL HIGH (ref 70–99)
Potassium: 4 mEq/L (ref 3.7–5.3)
Sodium: 140 mEq/L (ref 137–147)

## 2013-06-21 MED ORDER — INSULIN ASPART 100 UNIT/ML ~~LOC~~ SOLN
0.0000 [IU] | SUBCUTANEOUS | Status: DC
Start: 2013-06-21 — End: 2013-06-21

## 2013-06-21 MED ORDER — CIPROFLOXACIN HCL 500 MG PO TABS
500.0000 mg | ORAL_TABLET | Freq: Two times a day (BID) | ORAL | Status: DC
  Administered 2013-06-21 – 2013-06-22 (×2): 500 mg via ORAL
  Filled 2013-06-21 (×4): qty 1

## 2013-06-21 MED ORDER — BD GETTING STARTED TAKE HOME KIT: 3/10ML X 30G SYRINGES
1.0000 | Freq: Once | Status: AC
  Administered 2013-06-21: 1
  Filled 2013-06-21: qty 1

## 2013-06-21 MED ORDER — INSULIN ASPART 100 UNIT/ML ~~LOC~~ SOLN
15.0000 [IU] | Freq: Once | SUBCUTANEOUS | Status: AC
  Administered 2013-06-21: 15 [IU] via SUBCUTANEOUS

## 2013-06-21 MED ORDER — INSULIN PEN STARTER KIT
1.0000 | Freq: Once | Status: DC
  Filled 2013-06-21: qty 1

## 2013-06-21 NOTE — Progress Notes (Signed)
Worked with patient on the insulin pen instruction.  Patient was able to return demonstration fairly well.  Needs written instructions to follow.  BD insulin syringe starter kit ordered from pharmacy because they did not have insulin pen starter kits.  This kit includes booklet with instructions. Her cousin was present during the demonstration and was very helpful.  Can watch video #511 on insulin pen, when network is working.  Harvel Ricks RN BSN CDE

## 2013-06-21 NOTE — Progress Notes (Signed)
Insulin starter kit ordered from pharmacy.  Will include book on giving insulin for both syringes and insulin pen.  Pharmacy out of insulin pen starter kits.  Will inform nurse of order. Patient can watch #511 video on insulin pen if able to get it on TV network.   Harvel Ricks RN BSN CDE

## 2013-06-21 NOTE — Progress Notes (Signed)
[facility] PROGRESS NOTE  Ashanti Ratti ZOX:[id number] DOB: 28-Feb-1971 DOA: 06/19/2013 PCP: Default, Provider, MD  Assessment/Plan: 1.DKA/ New onset DM -Continue IV insulin via glucostabilizer till [employer] closes and meets BG criteria as per protocol -DM teaching per DM coordinator on 4/2 -her A1C is 12.6, she will need to stay on lantus/insulin on d/c -Nursing staff to continue DM teaching/insulin self administration 2. possible UTI  Urine culture ordered but no specimen sent so far, continue Cipro  3. Schizophrenia  Continue Seroquel.  4. Dyslipidemia  Continue Lipitor.   Code Status: full Family Communication: none at bedside  Disposition Plan: to home when medically ready   Consultants:  none  Procedures:  none  Antibiotics:  none   HPI/Subjective: She reports still having some difficulty with insulin self administration a nurse agrees.-  Objective: Filed Vitals:   06/21/13 1132  BP: 93/61  Pulse: 74  Temp: 98.2 F (36.8 C)  Resp: 19    Intake/Output Summary (Last 24 hours) at 06/21/13 1441 Last data filed at 06/21/13 0600  Gross per 24 hour  Intake 1391.67 ml  Output      0 ml  Net 1391.67 ml   Filed Weights   06/19/13 1749 06/19/13 2130  Weight: 72.122 kg (159 lb) 68.765 kg (151 lb 9.6 oz)    Exam:  General: alert & oriented x 3 In NAD Cardiovascular: RRR, nl S1 s2 Respiratory: CTAB Abdomen: soft +BS NT/ND, no masses palpable Extremities: No cyanosis and no edema     Data Reviewed: Basic Metabolic Panel:  Recent Labs Lab 06/20/13 0124 06/20/13 0700 06/20/13 1400 06/20/13 1600 06/21/13 0543  NA 140 143 139 142 140  K 3.6* 3.6* 4.2 3.6* 4.0  CL 104 108 106 108 106  CO2 18* 22 18* 22 23  GLUCOSE 190* 129* 188* 132* 138*  BUN 10 7 6  5* 7  CREATININE 0.70 0.66 0.69 0.69 0.99  CALCIUM 8.5 8.3* 8.8 9.0 8.7   Liver Function Tests:  Recent Labs Lab 06/19/13 1752  AST 21  ALT 12  ALKPHOS 102  BILITOT 0.4  PROT 7.6  ALBUMIN  4.2   No results found for this basename: LIPASE, AMYLASE,  in the last 168 hours No results found for this basename: AMMONIA,  in the last 168 hours CBC:  Recent Labs Lab 06/19/13 1752  WBC 6.1  NEUTROABS 2.7  HGB 13.2  HCT 37.6  MCV 89.1  PLT 221   Cardiac Enzymes: No results found for this basename: CKTOTAL, CKMB, CKMBINDEX, TROPONINI,  in the last 168 hours BNP (last 3 results) No results found for this basename: PROBNP,  in the last 8760 hours CBG:  Recent Labs Lab 06/20/13 2109 06/20/13 2341 06/21/13 0315 06/21/13 0742 06/21/13 1137  GLUCAP 258* 334* 155* 136* 239*    No results found for this or any previous visit (from the past 240 hour(s)).   Studies: No results found.  Scheduled Meds: . atorvastatin  20 mg Oral Daily  . ciprofloxacin  400 mg Intravenous Q12H  . enoxaparin (LOVENOX) injection  40 mg Subcutaneous Q24H  . insulin aspart  0-9 Units Subcutaneous 6 times per day  . insulin glargine  15 Units Subcutaneous QHS  . living well with diabetes book   Does not apply Once  . QUEtiapine  400 mg Oral BID   Continuous Infusions: . sodium chloride 100 mL/hr at 06/21/13 1428  . insulin (NOVOLIN-R) infusion Stopped (06/20/13 2005)    Principal Problem:   DKA (diabetic  ketoacidoses) Active Problems:   Dyslipidemia   Schizophrenia   UTI (urinary tract infection)   Diabetes mellitus   Family history of diabetes mellitus type I    Time spent: 6525    [facility] SouthVIYUOH,Keigan Tafoya C  [facility] Pager [phone]. If 7PM-7AM, please contact night-coverage at [email or web address], password [email or web address] 06/21/2013, 2:41 PM  LOS: 2 days

## 2013-06-21 NOTE — Plan of Care (Signed)
Problem: Food- and Nutrition-Related Knowledge Deficit (NB-1.1) Goal: Nutrition education Formal process to instruct or train a patient/client in a skill or to impart knowledge to help patients/clients voluntarily manage or modify food choices and eating behavior to maintain or improve health. Outcome: Completed/Met Date Met:  06/21/13  RD consulted for nutrition education regarding diabetes.     Lab Results  Component Value Date    HGBA1C 12.6* 06/19/2013    RD provided "Carbohydrate Counting for People with Diabetes" handout from the Academy of Nutrition and Dietetics. Discussed different food groups and their effects on blood sugar, emphasizing carbohydrate-containing foods. Provided list of carbohydrates and recommended serving sizes of common foods.  Discussed importance of controlled and consistent carbohydrate intake throughout the day. Provided examples of ways to balance meals/snacks and encouraged intake of high-fiber, whole grain complex carbohydrates. Teach back method used.  Expect good compliance.  Body mass index is 24.48 kg/(m^2). Pt meets criteria for normal body weight based on current BMI.  Current diet order is Carb modified, patient is consuming approximately 100% of meals at this time. Labs and medications reviewed. No further nutrition interventions warranted at this time. RD contact information provided. If additional nutrition issues arise, please re-consult RD.  Terrace Arabia RD, LDN

## 2013-06-21 NOTE — Progress Notes (Signed)
   CARE MANAGEMENT NOTE 06/21/2013  Patient:  Barrett,Meghan   Account Number:  [id number]  Date Initiated:  06/21/2013  Documentation initiated by:  Darlyne RussianOLAND,Claus Silvestro  Subjective/Objective Assessment:   admitted with hyperglycemia, newly diagnosed diabetic     Action/Plan:   medication assistance, MD follow up   Anticipated DC Date:     Anticipated DC Plan:        DC Planning Services  CM consult  [facility]      Choice offered to / List presented to:             Status of service:  In process, will continue to follow Medicare Important Message given?   (If response is "NO", the following Medicare IM given date fields will be blank) Date Medicare IM given:   Date Additional Medicare IM given:    Discharge Disposition:    Per UR Regulation:    If discussed at Long Length of Stay Meetings, dates discussed:    Comments:  06/20/2013  1230 Darlyne RussianAnnette Tafari [place] RN, [place] [phone] [facility] Clydie BraunKaren called with appointment MD appointment:  08/14/2013 @ 10:30 am  and diabetic review with Pharm @ 11:15 am  06/21/2013  [address]30 Meghan Barrett HoughtonRN, [place]CCM [phone]  [facility], left message requesting MD appointment and diabetic classes post discharge email sent with above request  Medicaid formulary reviewed for diabetic medications

## 2013-06-21 NOTE — Progress Notes (Signed)
Attempted several times today to get education network to show pt DM videos, site not connecting. Night RN given report to try again tonight to see if site is working again and have pt watch all videos. Pt has info for videos at bedside.

## 2013-06-21 NOTE — Progress Notes (Signed)
Met with patient, she has MD follow up at the [facility]) on [address]. She plans to continue going to clinic, does not need [place].

## 2013-06-22 LAB — GLUCOSE, CAPILLARY
GLUCOSE-CAPILLARY: 291 mg/dL — AB (ref 70–99)
Glucose-Capillary: 107 mg/dL — ABNORMAL HIGH (ref 70–99)
Glucose-Capillary: 131 mg/dL — ABNORMAL HIGH (ref 70–99)
Glucose-Capillary: 375 mg/dL — ABNORMAL HIGH (ref 70–99)

## 2013-06-22 MED ORDER — INSULIN PEN NEEDLE 32G X 4 MM MISC: Status: DC

## 2013-06-22 MED ORDER — FREESTYLE SYSTEM KIT: PACK | Status: DC

## 2013-06-22 MED ORDER — INSULIN LISPRO 100 UNIT/ML (KWIKPEN): PEN_INJECTOR | SUBCUTANEOUS | Status: DC

## 2013-06-22 MED ORDER — INSULIN GLARGINE 100 UNIT/ML ~~LOC~~ SOLN: 18.0000 [IU] | Freq: Every day | SUBCUTANEOUS | Status: DC

## 2013-06-22 MED ORDER — CIPROFLOXACIN HCL 500 MG PO TABS: 500.0000 mg | ORAL_TABLET | Freq: Two times a day (BID) | ORAL | Status: DC

## 2013-06-22 MED ORDER — LIVING WELL WITH DIABETES BOOK: 1.0000 | Freq: Once | Status: DC

## 2013-06-22 MED ORDER — FREESTYLE LANCETS MISC: Status: DC

## 2013-06-22 MED ORDER — INSULIN GLARGINE 100 UNIT/ML SOLOSTAR PEN: 18.0000 [IU] | PEN_INJECTOR | Freq: Every day | SUBCUTANEOUS | Status: DC

## 2013-06-22 NOTE — Discharge Summary (Addendum)
Physician Discharge Summary  Meghan Barrett MRN:[id number] DOB: 12/27/1970 DOA: 06/19/2013  PCP: Default, Provider, MD  Admit date: 06/19/2013 Discharge date: 06/22/2013  Time spent: >30 minutes  Recommendations for Outpatient Follow-up:  Follow-up Information   Please follow up. ([facility] next week, call for appointment upon discharge. Keep log of blood sugars and take to this followup appointment)        Discharge Diagnoses:  Principal Problem:   DKA (diabetic ketoacidoses) Active Problems:   Dyslipidemia   Schizophrenia   UTI (urinary tract infection)   Diabetes mellitus   Family history of diabetes mellitus type I   Discharge Condition: Improved/stable  Diet recommendation: Modified carbohydrate diet  Filed Weights   06/19/13 1749 06/19/13 2130  Weight: 72.122 kg (159 lb) 68.765 kg (151 lb 9.6 oz)    History of present illness:  Meghan Barrett is a 42 y.o. female with Past medical history of schizophrenia.  Patient presented with complaints of polyuria that is present since last 3 weeks initially she also complains of some burning urination which has improved. Since last one week she started having complaints of muscle cramps both in her upper and lower extremity. Her symptoms were progressively worsening and her pain was getting worse and therefore she went to see her doctor.  They did urine analysis and checked her sugar and it was elevated therefore sent her here.  Patient denies any prior history of diabetes, she does mention about significant family history of diabetes she also has type 1 diabetes in her first cousin.  She denies any nausea or vomiting abdominal pain fever or chills diarrhea or constipation.  She has not made any significant change in her medication she's not taking any over-the-counter medications denies any alcohol or drug abuse.  she initially thought that this is seroquel causing her muscle cramps therefore she stopped taking it.  She mentions she  was recently few months ago started on Lipitor as her cholesterol was in 400s.  The patient is coming from home. And at her baseline Independent for most of her ADL. She was admitted for further evaluation and management.     Hospital Course:  1.DKA/ New onset DM  -As discussed above, upon admission patient was placed on IV insulin via glucostabilizer till AG closed and she met BG criteria as per protocol. She was then transitioned to Lantus with sliding scale coverage -DM teaching was done in the hospital per DM coordinator, patient is also to be set up for outpatient diabetes teaching. -her A1C is 12.6, she will need to stay on lantus/insulin on d/c  -Nursing staff did DM teaching/insulin self administration and patient is not proficient with giving herself insulin injections -She is medically ready for discharge at this time and she is to followup with her PCP at the [facility], she has been instructed to take a log of her blood sugars with her to followup appointments  2. possible UTI  Urine culture ordered but no specimen sent so far she was treated with Cipro and be discharged on oral Cipro to complete the antibiotic course 3. Schizophrenia  Continue Seroquel.  4. Dyslipidemia  Continue Lipitor.   Procedures:  None  Consultations:  None  Discharge Exam: Filed Vitals:   06/22/13 0937  BP: 96/70  Pulse: 88  Temp: 98.2 F (36.8 C)  Resp: 18   Exam:  General: alert & oriented x 3 In NAD  Cardiovascular: RRR, nl S1 s2  Respiratory: CTAB  Abdomen: soft +BS NT/ND, no   masses palpable  Extremities: No cyanosis and no edema     Discharge Instructions You were cared for by a hospitalist during your hospital stay. If you have any questions about your discharge medications or the care you received while you were in the hospital after you are discharged, you can call the unit and asked to speak with the hospitalist on call if the hospitalist that took care of you is not  available. Once you are discharged, your primary care physician will handle any further medical issues. Please note that NO REFILLS for any discharge medications will be authorized once you are discharged, as it is imperative that you return to your primary care physician (or establish a relationship with a primary care physician if you do not have one) for your aftercare needs so that they can reassess your need for medications and monitor your lab values.  Discharge Orders   Future Orders Complete By Expires   Diet Carb Modified  As directed    Discharge instructions  As directed    Comments:     Sliding scale insulin directions insulin aspart (novoLOG)   Subcutaneous, 3 times daily with meals,  Correction coverage: Moderate  CBG < 70: implement low blood sugar protocol as directed CBG 70 - 120: 0 units  CBG 121 - 150: 2 units  CBG 151 - 200: 3 units  CBG 201 - 250: 5 units  CBG 251 - 300: 8 units  CBG 301 - 350: 11 units  CBG 351 - 400: 15 units  CBG > 400: call your MD/PCP   Increase activity slowly  As directed    PR BLOOD GLUCOSE TEST STRIPS  As directed        Medication List         atorvastatin 20 MG tablet  Commonly known as:  LIPITOR  Take 20 mg by mouth daily.     ciprofloxacin 500 MG tablet  Commonly known as:  CIPRO  Take 1 tablet (500 mg total) by mouth 2 (two) times daily.     freestyle lancets  Use as instructed     glucose monitoring kit monitoring kit  Glucometer to check blood sugars before meals and at bedtime as directed     Insulin Glargine 100 UNIT/ML Solostar Pen  Commonly known as:  LANTUS  Inject 18 Units into the skin daily at 10 pm.     insulin lispro 100 UNIT/ML KiwkPen  Commonly known as:  HUMALOG  - Subcutaneous, 3 times daily with meals as directed     Insulin Pen Needle 32G X 4 MM Misc  Commonly known as:  INSUPEN PEN NEEDLES  For use with Lantus and Humalog insulin Pens as directed     living well with diabetes book Misc  1  each by Does not apply route once.     QUEtiapine 400 MG tablet  Commonly known as:  SEROQUEL  Take 400 mg by mouth 2 (two) times daily.       Allergies  Allergen Reactions  . Penicillins Anaphylaxis      The results of significant diagnostics from this hospitalization (including imaging, microbiology, ancillary and laboratory) are listed below for reference.    Significant Diagnostic Studies: No results found.  Microbiology: No results found for this or any previous visit (from the past 240 hour(s)).   Labs: Basic Metabolic Panel:  Recent Labs Lab 06/20/13 0124 06/20/13 0700 06/20/13 1400 06/20/13 1600 06/21/13 0543  NA 140 143 139 142 140  K 3.6* 3.6* 4.2 3.6* 4.0  CL 104 108 106 108 106  CO2 18* 22 18* 22 23  GLUCOSE 190* 129* 188* 132* 138*  BUN _0 5* 7  CREATININE 0.70 0.66 0.69 0.69 0.99  CALCIUM 8.5 8.3* 8.8 9.0 8.7   Liver Function Tests:  Recent Labs Lab 06/19/13 1752  AST 21  ALT 12  ALKPHOS 102  BILITOT 0.4  PROT 7.6  ALBUMIN 4.2   No results found for this basename: LIPASE, AMYLASE,  in the last 168 hours No results found for this basename: AMMONIA,  in the last 168 hours CBC:  Recent Labs Lab 06/19/13 1752  WBC 6.1  NEUTROABS 2.7  HGB 13.2  HCT 37.6  MCV 89.1  PLT 221   Cardiac Enzymes: No results found for this basename: CKTOTAL, CKMB, CKMBINDEX, TROPONINI,  in the last 168 hours BNP: BNP (last 3 results) No results found for this basename: PROBNP,  in the last 8760 hours CBG:  Recent Labs Lab 06/21/13 1938 06/21/13 2242 06/22/13 0331 06/22/13 0726 06/22/13 1121  GLUCAP 369* 272* 107* 131* 375*       Signed:  Leron Stoffers C  [facility] 06/22/2013, 1:38 PM

## 2013-06-23 LAB — GLUTAMIC ACID DECARBOXYLASE AUTO ABS: Glutamic Acid Decarb Ab: 5.7 U/mL — ABNORMAL HIGH (ref <=1.0)

## 2013-08-14 ENCOUNTER — Inpatient Hospital Stay: Payer: Medicaid Other | Admitting: Internal Medicine

## 2013-08-14 ENCOUNTER — Ambulatory Visit: Payer: Medicaid Other | Admitting: Pharmacist

## 2013-10-29 ENCOUNTER — Other Ambulatory Visit: Payer: Self-pay | Admitting: Internal Medicine

## 2015-08-06 HISTORY — PX: APPENDECTOMY: SHX54

## 2015-08-16 ENCOUNTER — Inpatient Hospital Stay (HOSPITAL_COMMUNITY)
Admission: EM | Admit: 2015-08-16 | Discharge: 2015-08-20 | DRG: 390 | Disposition: A | Discharge status: Home / Self Care | Payer: Medicaid Other | Attending: Internal Medicine | Admitting: Internal Medicine

## 2015-08-16 ENCOUNTER — Encounter (HOSPITAL_COMMUNITY): Payer: Self-pay

## 2015-08-16 ENCOUNTER — Emergency Department (HOSPITAL_COMMUNITY): Payer: Medicaid Other

## 2015-08-16 DIAGNOSIS — E785 Hyperlipidemia, unspecified: Secondary | ICD-10-CM | POA: Diagnosis present

## 2015-08-16 DIAGNOSIS — F329 Major depressive disorder, single episode, unspecified: Secondary | ICD-10-CM | POA: Diagnosis present

## 2015-08-16 DIAGNOSIS — F419 Anxiety disorder, unspecified: Secondary | ICD-10-CM | POA: Diagnosis present

## 2015-08-16 DIAGNOSIS — K565 Intestinal adhesions [bands] with obstruction (postprocedural) (postinfection): Principal | ICD-10-CM | POA: Diagnosis present

## 2015-08-16 DIAGNOSIS — J45909 Unspecified asthma, uncomplicated: Secondary | ICD-10-CM | POA: Diagnosis present

## 2015-08-16 DIAGNOSIS — Z880 Allergy status to penicillin: Secondary | ICD-10-CM

## 2015-08-16 DIAGNOSIS — Z794 Long term (current) use of insulin: Secondary | ICD-10-CM

## 2015-08-16 DIAGNOSIS — E11649 Type 2 diabetes mellitus with hypoglycemia without coma: Secondary | ICD-10-CM | POA: Diagnosis present

## 2015-08-16 DIAGNOSIS — F209 Schizophrenia, unspecified: Secondary | ICD-10-CM | POA: Diagnosis present

## 2015-08-16 DIAGNOSIS — Z79899 Other long term (current) drug therapy: Secondary | ICD-10-CM

## 2015-08-16 DIAGNOSIS — F17210 Nicotine dependence, cigarettes, uncomplicated: Secondary | ICD-10-CM | POA: Diagnosis present

## 2015-08-16 DIAGNOSIS — E119 Type 2 diabetes mellitus without complications: Secondary | ICD-10-CM

## 2015-08-16 DIAGNOSIS — K56609 Unspecified intestinal obstruction, unspecified as to partial versus complete obstruction: Secondary | ICD-10-CM

## 2015-08-16 LAB — COMPREHENSIVE METABOLIC PANEL
ALBUMIN: 4.1 g/dL (ref 3.5–5.0)
ALT: 13 U/L — ABNORMAL LOW (ref 14–54)
ANION GAP: 11 (ref 5–15)
AST: 18 U/L (ref 15–41)
Alkaline Phosphatase: 45 U/L (ref 38–126)
BUN: 14 mg/dL (ref 6–20)
CO2: 27 mmol/L (ref 22–32)
Calcium: 9.8 mg/dL (ref 8.9–10.3)
Chloride: 98 mmol/L — ABNORMAL LOW (ref 101–111)
Creatinine, Ser: 1.14 mg/dL — ABNORMAL HIGH (ref 0.44–1.00)
GFR calc non Af Amer: 58 mL/min — ABNORMAL LOW (ref >60)
GLUCOSE: 93 mg/dL (ref 65–99)
POTASSIUM: 3.9 mmol/L (ref 3.5–5.1)
SODIUM: 136 mmol/L (ref 135–145)
Total Bilirubin: 1 mg/dL (ref 0.3–1.2)
Total Protein: 7.6 g/dL (ref 6.5–8.1)

## 2015-08-16 LAB — URINALYSIS, ROUTINE W REFLEX MICROSCOPIC
Glucose, UA: NEGATIVE mg/dL
NITRITE: NEGATIVE
PH: 5.5 (ref 5.0–8.0)
Protein, ur: 30 mg/dL — AB
SPECIFIC GRAVITY, URINE: 1.034 — AB (ref 1.005–1.030)

## 2015-08-16 LAB — CBC
HEMATOCRIT: 37.8 % (ref 36.0–46.0)
HEMOGLOBIN: 12.3 g/dL (ref 12.0–15.0)
MCH: 30.2 pg (ref 26.0–34.0)
MCHC: 32.5 g/dL (ref 30.0–36.0)
MCV: 92.9 fL (ref 78.0–100.0)
Platelets: 375 10*3/uL (ref 150–400)
RBC: 4.07 MIL/uL (ref 3.87–5.11)
RDW: 12.7 % (ref 11.5–15.5)
WBC: 7.1 10*3/uL (ref 4.0–10.5)

## 2015-08-16 LAB — I-STAT BETA HCG BLOOD, ED (MC, WL, AP ONLY)

## 2015-08-16 LAB — URINE MICROSCOPIC-ADD ON

## 2015-08-16 LAB — LIPASE, BLOOD: LIPASE: 26 U/L (ref 11–51)

## 2015-08-16 MED ORDER — SODIUM CHLORIDE 0.9 % IV BOLUS (SEPSIS)
1000.0000 mL | Freq: Once | INTRAVENOUS | Status: AC
  Administered 2015-08-16: 1000 mL via INTRAVENOUS

## 2015-08-16 MED ORDER — ONDANSETRON HCL 4 MG/2ML IJ SOLN
4.0000 mg | Freq: Once | INTRAMUSCULAR | Status: AC
  Administered 2015-08-16: 4 mg via INTRAVENOUS
  Filled 2015-08-16: qty 2

## 2015-08-16 MED ORDER — ONDANSETRON 4 MG PO TBDP
ORAL_TABLET | ORAL | Status: DC
Start: 2015-08-16 — End: 2015-08-17
  Filled 2015-08-16: qty 1

## 2015-08-16 MED ORDER — ONDANSETRON 4 MG PO TBDP
4.0000 mg | ORAL_TABLET | Freq: Once | ORAL | Status: AC | PRN
  Administered 2015-08-16: 4 mg via ORAL

## 2015-08-16 MED ORDER — IOPAMIDOL (ISOVUE-300) INJECTION 61%
INTRAVENOUS | Status: AC
  Administered 2015-08-16: 100 mL
  Filled 2015-08-16: qty 100

## 2015-08-16 MED ORDER — MORPHINE SULFATE (PF) 4 MG/ML IV SOLN
4.0000 mg | Freq: Once | INTRAVENOUS | Status: AC
Start: 2015-08-16 — End: 2015-08-16
  Administered 2015-08-16: 4 mg via INTRAVENOUS
  Filled 2015-08-16: qty 1

## 2015-08-16 MED ORDER — MORPHINE SULFATE (PF) 4 MG/ML IV SOLN
4.0000 mg | Freq: Once | INTRAVENOUS | Status: AC
  Administered 2015-08-16: 4 mg via INTRAVENOUS
  Filled 2015-08-16: qty 1

## 2015-08-16 MED ORDER — ONDANSETRON 4 MG PO TBDP: 4.0000 mg | ORAL_TABLET | Freq: Once | ORAL | Status: DC | PRN

## 2015-08-16 MED ORDER — ONDANSETRON 4 MG PO TBDP
ORAL_TABLET | ORAL | Status: AC
  Filled 2015-08-16: qty 1

## 2015-08-16 NOTE — ED Notes (Signed)
(  [phone] Dawn  Call if cousin if needed for anything.

## 2015-08-16 NOTE — ED Notes (Signed)
[place] from [place] 2 [place] ago visiting friends.  Pt had appendectomy 08-06-15.  Pt drank alcohol 2 nights ago and since then has had abd pain, vomiting x 2  Last vomit 1:30pm.

## 2015-08-16 NOTE — Consult Note (Addendum)
Reason for Consult:pSBO Referring Physician: Dr Michel Bickers is an 45 y.o. female.  HPI: 45 yo female s/p lap converted to open appy at [facility] in [place] last Thursday who was recuperating well until she developed abd pain, multiple episodes of n/v yesterday. Reports she had a cocktail or 2 and after that she got sick. Last BM was Sat am, loose. No flatus since then. Subjective fever;chills. States she was told it was too stuck and had to have open surgery. Visiting town looking for house to move back into.   Past Medical History  Diagnosis Date  . Asthma   . Hernia   . High cholesterol   . Diabetes mellitus ([place]) dx'd 06/19/2013  . Anxiety   . Depression   . Schizophrenia [facility])     Past Surgical History  Procedure Laterality Date  . Cesarean section  1991; 1999; 2002  . Finger fracture surgery Left 2010  . Tubal ligation  2002  . Appendectomy  08-06-15    History reviewed. No pertinent family history.  Social History:  reports that she has been smoking Cigarettes.  She has smoked for the past 25 years. She has never used smokeless tobacco. She reports that she drinks about 0.6 oz of alcohol per week. She reports that she uses illicit drugs ("Crack" cocaine).  Allergies:  Allergies  Allergen Reactions  . Penicillins Anaphylaxis    Medications: I have reviewed the patient's current medications.  Results for orders placed or performed during the hospital encounter of 08/16/15 (from the past 48 hour(s))  Lipase, blood     Status: None   Collection Time: 08/16/15  3:39 PM  Result Value Ref Range   Lipase 26 11 - 51 U/L  Comprehensive metabolic panel     Status: Abnormal   Collection Time: 08/16/15  3:39 PM  Result Value Ref Range   Sodium 136 135 - 145 mmol/L   Potassium 3.9 3.5 - 5.1 mmol/L   Chloride 98 (L) 101 - 111 mmol/L   CO2 27 22 - 32 mmol/L   Glucose, Bld 93 65 - 99 mg/dL   BUN 14 6 - 20 mg/dL   Creatinine, Ser 1.14 (H) 0.44 - 1.00 mg/dL   Calcium 9.8 8.9 - 10.3 mg/dL   Total Protein 7.6 6.5 - 8.1 g/dL   Albumin 4.1 3.5 - 5.0 g/dL   AST 18 15 - 41 U/L   ALT 13 (L) 14 - 54 U/L   Alkaline Phosphatase 45 38 - 126 U/L   Total Bilirubin 1.0 0.3 - 1.2 mg/dL   GFR calc non Af Amer 58 (L) >60 mL/min   GFR calc Af Amer >60 >60 mL/min    Comment: (NOTE) The eGFR has been calculated using the CKD EPI equation. This calculation has not been validated in all clinical situations. eGFR's persistently <60 mL/min signify possible Chronic Kidney Disease.    Anion gap 11 5 - 15  CBC     Status: None   Collection Time: 08/16/15  3:39 PM  Result Value Ref Range   WBC 7.1 4.0 - 10.5 K/uL   RBC 4.07 3.87 - 5.11 MIL/uL   Hemoglobin 12.3 12.0 - 15.0 g/dL   HCT 37.8 36.0 - 46.0 %   MCV 92.9 78.0 - 100.0 fL   MCH 30.2 26.0 - 34.0 pg   MCHC 32.5 30.0 - 36.0 g/dL   RDW 12.7 11.5 - 15.5 %   Platelets 375 150 - 400 K/uL  Urinalysis, Routine  w reflex microscopic     Status: Abnormal   Collection Time: 08/16/15  3:55 PM  Result Value Ref Range   Color, Urine AMBER (A) YELLOW    Comment: BIOCHEMICALS MAY BE AFFECTED BY COLOR   APPearance CLOUDY (A) CLEAR   Specific Gravity, Urine 1.034 (H) 1.005 - 1.030   pH 5.5 5.0 - 8.0   Glucose, UA NEGATIVE NEGATIVE mg/dL   Hgb urine dipstick MODERATE (A) NEGATIVE   Bilirubin Urine SMALL (A) NEGATIVE   Ketones, ur >80 (A) NEGATIVE mg/dL   Protein, ur 30 (A) NEGATIVE mg/dL   Nitrite NEGATIVE NEGATIVE   Leukocytes, UA TRACE (A) NEGATIVE  Urine microscopic-add on     Status: Abnormal   Collection Time: 08/16/15  3:55 PM  Result Value Ref Range   Squamous Epithelial / LPF 6-30 (A) NONE SEEN   WBC, UA 0-5 0 - 5 WBC/hpf   RBC / HPF 0-5 0 - 5 RBC/hpf   Bacteria, UA FEW (A) NONE SEEN   Urine-Other MUCOUS PRESENT   I-Stat beta hCG blood, ED     Status: None   Collection Time: 08/16/15  4:13 PM  Result Value Ref Range   I-stat hCG, quantitative <5.0 <5 mIU/mL   Comment 3            Comment:   GEST. AGE       CONC.  (mIU/mL)   <=1 WEEK        5 - 50     2 WEEKS       50 - 500     3 WEEKS       100 - 10,000     4 WEEKS     1,000 - 30,000        FEMALE AND NON-PREGNANT FEMALE:     LESS THAN 5 mIU/mL     Ct Abdomen Pelvis W Contrast  08/16/2015  CLINICAL DATA:  Acute onset of generalized abdominal pain and vomiting. Status post recent appendectomy. Initial encounter. EXAM: CT ABDOMEN AND PELVIS WITH CONTRAST TECHNIQUE: Multidetector CT imaging of the abdomen and pelvis was performed using the standard protocol following bolus administration of intravenous contrast. CONTRAST:  139m ISOVUE-300 IOPAMIDOL (ISOVUE-300) INJECTION 61% COMPARISON:  None. FINDINGS: The visualized lung bases are clear. There is dilatation of small-bowel loops up to 4.2 cm in maximal diameter, with focal decompression at the mid lower abdomen along the mid to distal ileum. Trace associated free fluid is seen. This most likely reflects small-bowel obstruction secondary to an underlying adhesion. The liver and spleen are unremarkable in appearance. The gallbladder is within normal limits. The pancreas and adrenal glands are unremarkable. The kidneys are unremarkable in appearance. There is no evidence of hydronephrosis. No renal or ureteral stones are seen. No perinephric stranding is appreciated. The stomach is within normal limits. No acute vascular abnormalities are seen. The appendix is not definitely characterized; there is no evidence for appendicitis. Mild diverticulosis is noted along the ascending colon. The colon is largely decompressed and is grossly unremarkable in appearance. The bladder is mildly distended and grossly unremarkable. The uterus is unremarkable in appearance. The ovaries are relatively symmetric. No suspicious adnexal masses are seen. A small amount of free fluid is seen within the pelvis. No inguinal lymphadenopathy is seen. No acute osseous abnormalities are identified. IMPRESSION: 1. Dilatation of  small-bowel loops to 4.2 cm in maximal diameter, with focal decompression at the mid lower abdomen along the mid to distal ileum. Trace associated free  fluid seen. Small amount of free fluid tracks into the pelvis. This most likely reflects small bowel obstruction secondary to an underlying adhesion. 2. Mild diverticulosis along the ascending colon, without evidence of diverticulitis. These results were called by telephone at the time of interpretation on 08/16/2015 at 9:31 pm to Dr. Gareth Morgan, who verbally acknowledged these results. Electronically Signed   By: Garald Balding M.D.   On: 08/16/2015 21:33    Review of Systems  Constitutional: Positive for fever and chills.  Gastrointestinal: Positive for nausea, vomiting and abdominal pain.  Genitourinary: Negative for dysuria and urgency.  All other systems reviewed and are negative.  Blood pressure 96/63, pulse 64, temperature 98.3 F (36.8 C), temperature source Oral, resp. rate 16, height '5\' 6"'$  (1.676 m), weight 55.929 kg (123 lb 4.8 oz), last menstrual period 08/09/2015, SpO2 100 %. Physical Exam  Vitals reviewed. Constitutional: She is oriented to person, place, and time. She appears well-developed and well-nourished. No distress.  HENT:  Head: Normocephalic and atraumatic.  Right Ear: External ear normal.  Left Ear: External ear normal.  Eyes: Conjunctivae are normal. No scleral icterus.  Neck: Normal range of motion. Neck supple. No tracheal deviation present. No thyromegaly present.  Cardiovascular: Normal rate and normal heart sounds.   Respiratory: Effort normal and breath sounds normal. No stridor. No respiratory distress. She has no wheezes.  GI: Soft. There is no rebound and no guarding.    Soft, mild distension; hypoBS, mild TTP. No cellulitis. No hernia  Musculoskeletal: She exhibits no edema or tenderness.  Lymphadenopathy:    She has no cervical adenopathy.  Neurological: She is alert and oriented to person, place,  and time. She exhibits normal muscle tone.  Skin: Skin is warm and dry. No rash noted. She is not diaphoretic. No erythema. No pallor.  Psychiatric: She has a normal mood and affect. Her behavior is normal. Judgment and thought content normal.    Assessment/Plan: S/p open appendectomy at Outside hospital  pSBO Polysubstance use/abuse DM2 Schizophrenia  No chart available in careeverywhere No sign of abscess on ct. No wbc. No fever. Believe can start mgmt of this without surgery - no fever, no tachycardia, no bowel wall thickening. No wbc, no tachycardia NPO, bowel rest Place NG tube to LIWS Repeat plain film in AM Check UDS Admit to medical service.   Updated sister per pt's request via phone.   Greer Pickerel M 08/16/2015, 10:33 PM

## 2015-08-16 NOTE — ED Provider Notes (Signed)
CSN: [id number]     Arrival date & time 08/16/15  1522 History   First MD Initiated Contact with Patient 08/16/15 1728     Chief Complaint  Patient presents with  . Abdominal Pain  . Emesis     (Consider location/radiation/quality/duration/timing/severity/associated sxs/prior Treatment) HPI   Appendix taken out in [facility] 5/18.  Saturday began throwing up, abdominal pain 10/10, lower abdominal pain. can't keep anything down, hot flashes.  Slight fevers, feeling cold/hot, chills.  Emesis 10 times since yesterday.  Difficult to urinate.  Small amt of diarrhea yesterday.  Not passing flatus.    Past Medical History  Diagnosis Date  . Asthma   . Hernia   . High cholesterol   . Diabetes mellitus ([facility]) dx'd 06/19/2013  . Anxiety   . Depression   . Schizophrenia [facility])    Past Surgical History  Procedure Laterality Date  . Cesarean section  1991; 1999; 2002  . Finger fracture surgery Left 2010  . Tubal ligation  2002  . Appendectomy  08-06-15   History reviewed. No pertinent family history. Social History  Substance Use Topics  . Smoking status: Current Some Day Smoker -- 25 years    Types: Cigarettes  . Smokeless tobacco: Never Used     Comment: 06/19/2013 "smoke ~ 5 cigarettes/month"  . Alcohol Use: 0.6 oz/week    1 Cans of beer per week   OB History    No data available     Review of Systems  Constitutional: Positive for fever (subjective) and chills.  HENT: Negative for sore throat.   Eyes: Negative for visual disturbance.  Respiratory: Negative for cough and shortness of breath.   Cardiovascular: Negative for chest pain.  Gastrointestinal: Positive for nausea, vomiting, abdominal pain, diarrhea and abdominal distention.  Genitourinary: Positive for vaginal bleeding (end of menses now) and difficulty urinating. Negative for dysuria and vaginal discharge.  Musculoskeletal: Negative for back pain and neck pain.  Skin: Negative for rash.  Neurological: Positive for  light-headedness. Negative for syncope and headaches.      Allergies  Penicillins  Home Medications   Prior to Admission medications   Medication Sig Start Date End Date Taking? Authorizing Provider  acetaminophen (TYLENOL) 500 MG tablet Take 500 mg by mouth every 6 (six) hours as needed for moderate pain.    Yes Historical Provider, MD  atorvastatin (LIPITOR) 20 MG tablet Take 20 mg by mouth daily.   Yes Historical Provider, MD  ciprofloxacin (CIPRO) 500 MG tablet Take 1,000 mg by mouth 3 (three) times daily.   Yes Historical Provider, MD  Insulin Glargine (LANTUS) 100 UNIT/ML Solostar Pen Inject 18 Units into the skin daily at 10 pm. 06/22/13  Yes Adeline C Viyuoh, MD  insulin lispro (HUMALOG) 100 UNIT/ML KiwkPen Subcutaneous, 3 times daily with meals  Correction coverage: Moderate CBG < 70: implement low blood sugar protocol as directed CBG 70 - 120: 0 units  CBG 121 - 150: 2 units  CBG 151 - 200: 3 units  CBG 201 - 250: 5 units  CBG 251 - 300: 8 units  CBG 301 - 350: 11 units  CBG 351 - 400: 15 units  CBG > 400: call your MD/PCP as directed 06/22/13  Yes Adeline C Viyuoh, MD  metroNIDAZOLE (FLAGYL) 500 MG tablet Take 500 mg by mouth 2 (two) times daily.   Yes Historical Provider, MD  QUEtiapine (SEROQUEL) 400 MG tablet Take 400 mg by mouth 2 (two) times daily.   Yes Historical Provider,  MD  ciprofloxacin (CIPRO) 500 MG tablet Take 1 tablet (500 mg total) by mouth 2 (two) times daily. 06/22/13   Sheila Oats, MD  glucose monitoring kit (FREESTYLE) monitoring kit Glucometer to check blood sugars before meals and at bedtime as directed 06/22/13   Sheila Oats, MD  Insulin Pen Needle (INSUPEN PEN NEEDLES) 32G X 4 MM MISC For use with Lantus and Humalog insulin Pens as directed 06/22/13   Sheila Oats, MD  Lancets (FREESTYLE) lancets Use as instructed 06/22/13   Sheila Oats, MD  living well with diabetes book MISC 1 each by Does not apply route once. 06/22/13   Sheila Oats, MD    BP 90/45 mmHg  Pulse 57  Temp(Src) 98.9 F (37.2 C) (Oral)  Resp 18  Ht '5\' 6"'$  (1.676 m)  Wt 133 lb 13.1 oz (60.7 kg)  BMI 21.61 kg/m2  SpO2 96%  LMP 08/09/2015 Physical Exam  Constitutional: She is oriented to person, place, and time. She appears well-developed and well-nourished. No distress.  HENT:  Head: Normocephalic and atraumatic.  Eyes: Conjunctivae and EOM are normal.  Neck: Normal range of motion.  Cardiovascular: Normal rate, regular rhythm, normal heart sounds and intact distal pulses.  Exam reveals no gallop and no friction rub.   No murmur heard. Pulmonary/Chest: Effort normal and breath sounds normal. No respiratory distress. She has no wheezes. She has no rales.  Abdominal: Soft. She exhibits distension. There is tenderness (diffuse, worse RLQ, suprapubic.). There is no guarding.  Incision clean, dry intact   Musculoskeletal: She exhibits no edema or tenderness.  Neurological: She is alert and oriented to person, place, and time.  Skin: Skin is warm and dry. No rash noted. She is not diaphoretic. No erythema.  Nursing note and vitals reviewed.   ED Course  Procedures (including critical care time) Labs Review Labs Reviewed  COMPREHENSIVE METABOLIC PANEL - Abnormal; Notable for the following:    Chloride 98 (*)    Creatinine, Ser 1.14 (*)    ALT 13 (*)    GFR calc non Af Amer 58 (*)    All other components within normal limits  URINALYSIS, ROUTINE W REFLEX MICROSCOPIC (NOT AT [facility]) - Abnormal; Notable for the following:    Color, Urine AMBER (*)    APPearance CLOUDY (*)    Specific Gravity, Urine 1.034 (*)    Hgb urine dipstick MODERATE (*)    Bilirubin Urine SMALL (*)    Ketones, ur >80 (*)    Protein, ur 30 (*)    Leukocytes, UA TRACE (*)    All other components within normal limits  URINE MICROSCOPIC-ADD ON - Abnormal; Notable for the following:    Squamous Epithelial / LPF 6-30 (*)    Bacteria, UA FEW (*)    All other components within normal  limits  COMPREHENSIVE METABOLIC PANEL - Abnormal; Notable for the following:    Creatinine, Ser 1.07 (*)    Calcium 8.4 (*)    Total Protein 5.8 (*)    Albumin 3.0 (*)    AST 14 (*)    ALT 11 (*)    Alkaline Phosphatase 33 (*)    All other components within normal limits  CBC - Abnormal; Notable for the following:    RBC 3.33 (*)    Hemoglobin 10.1 (*)    HCT 31.7 (*)    All other components within normal limits  LIPASE, BLOOD  CBC  GLUCOSE, CAPILLARY  MAGNESIUM  GLUCOSE, CAPILLARY  I-STAT BETA HCG BLOOD, ED ([facility], [facility], [facility] ONLY)    Imaging Review Ct Abdomen Pelvis W Contrast  08/16/2015  CLINICAL DATA:  Acute onset of generalized abdominal pain and vomiting. Status post recent appendectomy. Initial encounter. EXAM: CT ABDOMEN AND PELVIS WITH CONTRAST TECHNIQUE: Multidetector CT imaging of the abdomen and pelvis was performed using the standard protocol following bolus administration of intravenous contrast. CONTRAST:  152m ISOVUE-300 IOPAMIDOL (ISOVUE-300) INJECTION 61% COMPARISON:  None. FINDINGS: The visualized lung bases are clear. There is dilatation of small-bowel loops up to 4.2 cm in maximal diameter, with focal decompression at the mid lower abdomen along the mid to distal ileum. Trace associated free fluid is seen. This most likely reflects small-bowel obstruction secondary to an underlying adhesion. The liver and spleen are unremarkable in appearance. The gallbladder is within normal limits. The pancreas and adrenal glands are unremarkable. The kidneys are unremarkable in appearance. There is no evidence of hydronephrosis. No renal or ureteral stones are seen. No perinephric stranding is appreciated. The stomach is within normal limits. No acute vascular abnormalities are seen. The appendix is not definitely characterized; there is no evidence for appendicitis. Mild diverticulosis is noted along the ascending colon. The colon is largely decompressed and is grossly unremarkable in  appearance. The bladder is mildly distended and grossly unremarkable. The uterus is unremarkable in appearance. The ovaries are relatively symmetric. No suspicious adnexal masses are seen. A small amount of free fluid is seen within the pelvis. No inguinal lymphadenopathy is seen. No acute osseous abnormalities are identified. IMPRESSION: 1. Dilatation of small-bowel loops to 4.2 cm in maximal diameter, with focal decompression at the mid lower abdomen along the mid to distal ileum. Trace associated free fluid seen. Small amount of free fluid tracks into the pelvis. This most likely reflects small bowel obstruction secondary to an underlying adhesion. 2. Mild diverticulosis along the ascending colon, without evidence of diverticulitis. These results were called by telephone at the time of interpretation on 08/16/2015 at 9:31 pm to Dr. EGareth Morgan who verbally acknowledged these results. Electronically Signed   By: JGarald BaldingM.D.   On: 08/16/2015 21:33   Dg Abd 2 Views  08/17/2015  CLINICAL DATA:  Follow-up small bowel obstruction EXAM: ABDOMEN - 2 VIEW COMPARISON:  08/16/2015 CT abdomen/pelvis FINDINGS: Enteric tube terminates in the distal stomach. There is persistent mild-to-moderate small bowel dilatation in the left and central abdomen measuring up to 3.8 cm diameter, with associated small bowel fluid levels, not definitely changed. Minimal gas in the colon. No evidence of pneumatosis or pneumoperitoneum. Excreted contrast is seen in the bladder. Clear lung bases. IMPRESSION: No appreciable change in distal small bowel obstruction. Electronically Signed   By: JIlona SorrelM.D.   On: 08/17/2015 08:52   I have personally reviewed and evaluated these images and lab results as part of my medical decision-making.   EKG Interpretation None      MDM   Final diagnoses:  SBO (small bowel obstruction) ([facility])  Small bowel obstruction ([facility]   89yofemale with history of DM, asthma, schizophrenia,  hypercholesterolemia[facility] concern for abdominal pain, nausea and vomiting.  CT abdomen shows SBO. Surgery consulted, NG tube placed and pt admitted to hospitalist service.     EGareth Morgan MD 08/17/15 1214

## 2015-08-17 ENCOUNTER — Observation Stay (HOSPITAL_COMMUNITY): Payer: Medicaid Other

## 2015-08-17 ENCOUNTER — Inpatient Hospital Stay (HOSPITAL_COMMUNITY): Payer: Medicaid Other

## 2015-08-17 DIAGNOSIS — K5669 Other intestinal obstruction: Secondary | ICD-10-CM | POA: Diagnosis not present

## 2015-08-17 DIAGNOSIS — E785 Hyperlipidemia, unspecified: Secondary | ICD-10-CM

## 2015-08-17 DIAGNOSIS — K565 Intestinal adhesions [bands] with obstruction (postprocedural) (postinfection): Secondary | ICD-10-CM | POA: Diagnosis present

## 2015-08-17 DIAGNOSIS — J45909 Unspecified asthma, uncomplicated: Secondary | ICD-10-CM | POA: Diagnosis present

## 2015-08-17 DIAGNOSIS — Z880 Allergy status to penicillin: Secondary | ICD-10-CM | POA: Diagnosis not present

## 2015-08-17 DIAGNOSIS — F329 Major depressive disorder, single episode, unspecified: Secondary | ICD-10-CM | POA: Diagnosis present

## 2015-08-17 DIAGNOSIS — F17210 Nicotine dependence, cigarettes, uncomplicated: Secondary | ICD-10-CM | POA: Diagnosis present

## 2015-08-17 DIAGNOSIS — E11649 Type 2 diabetes mellitus with hypoglycemia without coma: Secondary | ICD-10-CM | POA: Diagnosis present

## 2015-08-17 DIAGNOSIS — F419 Anxiety disorder, unspecified: Secondary | ICD-10-CM | POA: Diagnosis present

## 2015-08-17 DIAGNOSIS — F209 Schizophrenia, unspecified: Secondary | ICD-10-CM | POA: Diagnosis present

## 2015-08-17 DIAGNOSIS — Z79899 Other long term (current) drug therapy: Secondary | ICD-10-CM | POA: Diagnosis not present

## 2015-08-17 DIAGNOSIS — E118 Type 2 diabetes mellitus with unspecified complications: Secondary | ICD-10-CM | POA: Diagnosis not present

## 2015-08-17 DIAGNOSIS — K56609 Unspecified intestinal obstruction, unspecified as to partial versus complete obstruction: Secondary | ICD-10-CM | POA: Diagnosis present

## 2015-08-17 DIAGNOSIS — R112 Nausea with vomiting, unspecified: Secondary | ICD-10-CM | POA: Diagnosis present

## 2015-08-17 DIAGNOSIS — Z794 Long term (current) use of insulin: Secondary | ICD-10-CM | POA: Diagnosis not present

## 2015-08-17 LAB — GLUCOSE, CAPILLARY
GLUCOSE-CAPILLARY: 91 mg/dL (ref 65–99)
GLUCOSE-CAPILLARY: 68 mg/dL (ref 65–99)
Glucose-Capillary: 98 mg/dL (ref 65–99)
Glucose-Capillary: 92 mg/dL (ref 65–99)
Glucose-Capillary: 112 mg/dL — ABNORMAL HIGH (ref 65–99)

## 2015-08-17 LAB — COMPREHENSIVE METABOLIC PANEL
ALK PHOS: 33 U/L — AB (ref 38–126)
ALT: 11 U/L — AB (ref 14–54)
AST: 14 U/L — ABNORMAL LOW (ref 15–41)
Albumin: 3 g/dL — ABNORMAL LOW (ref 3.5–5.0)
Anion gap: 5 (ref 5–15)
BUN: 13 mg/dL (ref 6–20)
CALCIUM: 8.4 mg/dL — AB (ref 8.9–10.3)
CO2: 29 mmol/L (ref 22–32)
CREATININE: 1.07 mg/dL — AB (ref 0.44–1.00)
Chloride: 107 mmol/L (ref 101–111)
Glucose, Bld: 82 mg/dL (ref 65–99)
Potassium: 3.8 mmol/L (ref 3.5–5.1)
Sodium: 141 mmol/L (ref 135–145)
TOTAL PROTEIN: 5.8 g/dL — AB (ref 6.5–8.1)
Total Bilirubin: 0.5 mg/dL (ref 0.3–1.2)

## 2015-08-17 LAB — CBC
HCT: 31.7 % — ABNORMAL LOW (ref 36.0–46.0)
Hemoglobin: 10.1 g/dL — ABNORMAL LOW (ref 12.0–15.0)
MCH: 30.3 pg (ref 26.0–34.0)
MCHC: 31.9 g/dL (ref 30.0–36.0)
MCV: 95.2 fL (ref 78.0–100.0)
PLATELETS: 319 10*3/uL (ref 150–400)
RBC: 3.33 MIL/uL — AB (ref 3.87–5.11)
RDW: 12.9 % (ref 11.5–15.5)
WBC: 7.9 10*3/uL (ref 4.0–10.5)

## 2015-08-17 LAB — MAGNESIUM: Magnesium: 1.8 mg/dL (ref 1.7–2.4)

## 2015-08-17 MED ORDER — DEXTROSE-NACL 5-0.45 % IV SOLN: INTRAVENOUS | Status: DC

## 2015-08-17 MED ORDER — HYDROMORPHONE HCL 1 MG/ML IJ SOLN
0.5000 mg | INTRAMUSCULAR | Status: DC | PRN
  Administered 2015-08-17 (×2): 1 mg via INTRAVENOUS
  Administered 2015-08-17: 0.5 mg via INTRAVENOUS
  Administered 2015-08-18 – 2015-08-20 (×17): 1 mg via INTRAVENOUS
  Filled 2015-08-17 (×20): qty 1

## 2015-08-17 MED ORDER — SODIUM CHLORIDE 0.9 % IV SOLN
INTRAVENOUS | Status: DC
  Administered 2015-08-17: 03:00:00 via INTRAVENOUS

## 2015-08-17 MED ORDER — SODIUM CHLORIDE 0.9 % IV BOLUS (SEPSIS)
500.0000 mL | Freq: Once | INTRAVENOUS | Status: AC
  Administered 2015-08-17: 500 mL via INTRAVENOUS

## 2015-08-17 MED ORDER — ONDANSETRON HCL 4 MG/2ML IJ SOLN: 4.0000 mg | Freq: Four times a day (QID) | INTRAMUSCULAR | Status: DC | PRN

## 2015-08-17 MED ORDER — INSULIN ASPART 100 UNIT/ML ~~LOC~~ SOLN: 0.0000 [IU] | SUBCUTANEOUS | Status: DC

## 2015-08-17 MED ORDER — ALBUTEROL SULFATE (2.5 MG/3ML) 0.083% IN NEBU: 2.5000 mg | INHALATION_SOLUTION | RESPIRATORY_TRACT | Status: DC | PRN

## 2015-08-17 MED ORDER — MORPHINE SULFATE (PF) 4 MG/ML IV SOLN
4.0000 mg | Freq: Once | INTRAVENOUS | Status: AC
  Administered 2015-08-17: 4 mg via INTRAVENOUS
  Filled 2015-08-17: qty 1

## 2015-08-17 MED ORDER — DEXTROSE 50 % IV SOLN
1.0000 | Freq: Once | INTRAVENOUS | Status: AC
  Administered 2015-08-17: 50 mL via INTRAVENOUS

## 2015-08-17 MED ORDER — DEXTROSE-NACL 5-0.45 % IV SOLN
INTRAVENOUS | Status: DC
  Administered 2015-08-17 – 2015-08-20 (×8): via INTRAVENOUS

## 2015-08-17 MED ORDER — DIATRIZOATE MEGLUMINE & SODIUM 66-10 % PO SOLN
90.0000 mL | Freq: Once | ORAL | Status: AC
  Administered 2015-08-17: 90 mL via NASOGASTRIC
  Filled 2015-08-17: qty 90

## 2015-08-17 MED ORDER — DEXTROSE 50 % IV SOLN
INTRAVENOUS | Status: AC
  Administered 2015-08-17: 09:00:00
  Filled 2015-08-17: qty 50

## 2015-08-17 MED ORDER — ONDANSETRON HCL 4 MG PO TABS: 4.0000 mg | ORAL_TABLET | Freq: Four times a day (QID) | ORAL | Status: DC | PRN

## 2015-08-17 NOTE — Progress Notes (Signed)
Patient ID: Meghan Barrett, female   DOB: 1971/02/13, 44 y.o.   MRN: [id number]     [facility]      [address] [place], [place] [address]    Phone: [phone] FAX: [phone]     Subjective: No flatus.  AXR unchanged.  Minimal ngt output, flushed and functioning.   Objective:  Vital signs:  Filed Vitals:   08/17/15 0200 08/17/15 0231 08/17/15 0324 08/17/15 0512  BP:  94/43 102/73 90/45  Pulse:  58  57  Temp:  98.1 F (36.7 C)  98.9 F (37.2 C)  TempSrc:  Oral  Oral  Resp:  20  18  Height: '5\' 6"'$  (1.676 m) '5\' 6"'$  (1.676 m)    Weight: 55.792 kg (123 lb) 60.7 kg (133 lb 13.1 oz)    SpO2:  97%  96%    Last BM Date: 08/15/15  Intake/Output   Yesterday:  05/28 0701 - 05/29 0700 In: 1000 [I.V.:1000] Out: 300 [Emesis/NG output:150] This shift:  Total I/O In: -  Out: 150 [Emesis/NG output:150]   Physical Exam: General: Pt awake/alert/oriented x4 in no acute distress  Abdomen: Soft.  Nondistended. Non tender.  rlq steri strips in place.  No erythema.  No evidence of peritonitis.  No incarcerated hernias.    Problem List:   Principal Problem:   Small bowel obstruction ([place]) Active Problems:   Schizophrenia ([place])   Diabetes mellitus ([place])   Hyperlipidemia    Results:   Labs: Results for orders placed or performed during the hospital encounter of 08/16/15 (from the past 48 hour(s))  Lipase, blood     Status: None   Collection Time: 08/16/15  3:39 PM  Result Value Ref Range   Lipase 26 11 - 51 U/L  Comprehensive metabolic panel     Status: Abnormal   Collection Time: 08/16/15  3:39 PM  Result Value Ref Range   Sodium 136 135 - 145 mmol/L   Potassium 3.9 3.5 - 5.1 mmol/L   Chloride 98 (L) 101 - 111 mmol/L   CO2 27 22 - 32 mmol/L   Glucose, Bld 93 65 - 99 mg/dL   BUN 14 6 - 20 mg/dL   Creatinine, Ser 1.14 (H) 0.44 - 1.00 mg/dL   Calcium 9.8 8.9 - 10.3 mg/dL   Total Protein 7.6 6.5 - 8.1 g/dL   Albumin  4.1 3.5 - 5.0 g/dL   AST 18 15 - 41 U/L   ALT 13 (L) 14 - 54 U/L   Alkaline Phosphatase 45 38 - 126 U/L   Total Bilirubin 1.0 0.3 - 1.2 mg/dL   GFR calc non Af Amer 58 (L) >60 mL/min   GFR calc Af Amer >60 >60 mL/min    Comment: (NOTE) The eGFR has been calculated using the CKD EPI equation. This calculation has not been validated in all clinical situations. eGFR's persistently <60 mL/min signify possible Chronic Kidney Disease.    Anion gap 11 5 - 15  CBC     Status: None   Collection Time: 08/16/15  3:39 PM  Result Value Ref Range   WBC 7.1 4.0 - 10.5 K/uL   RBC 4.07 3.87 - 5.11 MIL/uL   Hemoglobin 12.3 12.0 - 15.0 g/dL   HCT 37.8 36.0 - 46.0 %   MCV 92.9 78.0 - 100.0 fL   MCH 30.2 26.0 - 34.0 pg   MCHC 32.5 30.0 - 36.0 g/dL   RDW 12.7 11.5 - 15.5 %  Platelets 375 150 - 400 K/uL  Urinalysis, Routine w reflex microscopic     Status: Abnormal   Collection Time: 08/16/15  3:55 PM  Result Value Ref Range   Color, Urine AMBER (A) YELLOW    Comment: BIOCHEMICALS MAY BE AFFECTED BY COLOR   APPearance CLOUDY (A) CLEAR   Specific Gravity, Urine 1.034 (H) 1.005 - 1.030   pH 5.5 5.0 - 8.0   Glucose, UA NEGATIVE NEGATIVE mg/dL   Hgb urine dipstick MODERATE (A) NEGATIVE   Bilirubin Urine SMALL (A) NEGATIVE   Ketones, ur >80 (A) NEGATIVE mg/dL   Protein, ur 30 (A) NEGATIVE mg/dL   Nitrite NEGATIVE NEGATIVE   Leukocytes, UA TRACE (A) NEGATIVE  Urine microscopic-add on     Status: Abnormal   Collection Time: 08/16/15  3:55 PM  Result Value Ref Range   Squamous Epithelial / LPF 6-30 (A) NONE SEEN   WBC, UA 0-5 0 - 5 WBC/hpf   RBC / HPF 0-5 0 - 5 RBC/hpf   Bacteria, UA FEW (A) NONE SEEN   Urine-Other MUCOUS PRESENT   I-Stat beta hCG blood, ED     Status: None   Collection Time: 08/16/15  4:13 PM  Result Value Ref Range   I-stat hCG, quantitative <5.0 <5 mIU/mL   Comment 3            Comment:   GEST. AGE      CONC.  (mIU/mL)   <=1 WEEK        5 - 50     2 WEEKS       50 -  500     3 WEEKS       100 - 10,000     4 WEEKS     1,000 - 30,000        FEMALE AND NON-PREGNANT FEMALE:     LESS THAN 5 mIU/mL   Glucose, capillary     Status: None   Collection Time: 08/17/15  2:45 AM  Result Value Ref Range   Glucose-Capillary 91 65 - 99 mg/dL  Comprehensive metabolic panel     Status: Abnormal   Collection Time: 08/17/15  4:36 AM  Result Value Ref Range   Sodium 141 135 - 145 mmol/L   Potassium 3.8 3.5 - 5.1 mmol/L   Chloride 107 101 - 111 mmol/L   CO2 29 22 - 32 mmol/L   Glucose, Bld 82 65 - 99 mg/dL   BUN 13 6 - 20 mg/dL   Creatinine, Ser 1.07 (H) 0.44 - 1.00 mg/dL   Calcium 8.4 (L) 8.9 - 10.3 mg/dL   Total Protein 5.8 (L) 6.5 - 8.1 g/dL   Albumin 3.0 (L) 3.5 - 5.0 g/dL   AST 14 (L) 15 - 41 U/L   ALT 11 (L) 14 - 54 U/L   Alkaline Phosphatase 33 (L) 38 - 126 U/L   Total Bilirubin 0.5 0.3 - 1.2 mg/dL   GFR calc non Af Amer >60 >60 mL/min   GFR calc Af Amer >60 >60 mL/min    Comment: (NOTE) The eGFR has been calculated using the CKD EPI equation. This calculation has not been validated in all clinical situations. eGFR's persistently <60 mL/min signify possible Chronic Kidney Disease.    Anion gap 5 5 - 15  CBC     Status: Abnormal   Collection Time: 08/17/15  4:36 AM  Result Value Ref Range   WBC 7.9 4.0 - 10.5 K/uL   RBC 3.33 (L) 3.87 -  5.11 MIL/uL   Hemoglobin 10.1 (L) 12.0 - 15.0 g/dL   HCT 31.7 (L) 36.0 - 46.0 %   MCV 95.2 78.0 - 100.0 fL   MCH 30.3 26.0 - 34.0 pg   MCHC 31.9 30.0 - 36.0 g/dL   RDW 12.9 11.5 - 15.5 %   Platelets 319 150 - 400 K/uL  Magnesium     Status: None   Collection Time: 08/17/15  6:02 AM  Result Value Ref Range   Magnesium 1.8 1.7 - 2.4 mg/dL  Glucose, capillary     Status: None   Collection Time: 08/17/15  8:13 AM  Result Value Ref Range   Glucose-Capillary 68 65 - 99 mg/dL    Imaging / Studies: Ct Abdomen Pelvis W Contrast  08/16/2015  CLINICAL DATA:  Acute onset of generalized abdominal pain and vomiting.  Status post recent appendectomy. Initial encounter. EXAM: CT ABDOMEN AND PELVIS WITH CONTRAST TECHNIQUE: Multidetector CT imaging of the abdomen and pelvis was performed using the standard protocol following bolus administration of intravenous contrast. CONTRAST:  173m ISOVUE-300 IOPAMIDOL (ISOVUE-300) INJECTION 61% COMPARISON:  None. FINDINGS: The visualized lung bases are clear. There is dilatation of small-bowel loops up to 4.2 cm in maximal diameter, with focal decompression at the mid lower abdomen along the mid to distal ileum. Trace associated free fluid is seen. This most likely reflects small-bowel obstruction secondary to an underlying adhesion. The liver and spleen are unremarkable in appearance. The gallbladder is within normal limits. The pancreas and adrenal glands are unremarkable. The kidneys are unremarkable in appearance. There is no evidence of hydronephrosis. No renal or ureteral stones are seen. No perinephric stranding is appreciated. The stomach is within normal limits. No acute vascular abnormalities are seen. The appendix is not definitely characterized; there is no evidence for appendicitis. Mild diverticulosis is noted along the ascending colon. The colon is largely decompressed and is grossly unremarkable in appearance. The bladder is mildly distended and grossly unremarkable. The uterus is unremarkable in appearance. The ovaries are relatively symmetric. No suspicious adnexal masses are seen. A small amount of free fluid is seen within the pelvis. No inguinal lymphadenopathy is seen. No acute osseous abnormalities are identified. IMPRESSION: 1. Dilatation of small-bowel loops to 4.2 cm in maximal diameter, with focal decompression at the mid lower abdomen along the mid to distal ileum. Trace associated free fluid seen. Small amount of free fluid tracks into the pelvis. This most likely reflects small bowel obstruction secondary to an underlying adhesion. 2. Mild diverticulosis along the  ascending colon, without evidence of diverticulitis. These results were called by telephone at the time of interpretation on 08/16/2015 at 9:31 pm to Dr. EGareth Morgan who verbally acknowledged these results. Electronically Signed   By: JGarald BaldingM.D.   On: 08/16/2015 21:33   Dg Abd 2 Views  08/17/2015  CLINICAL DATA:  Follow-up small bowel obstruction EXAM: ABDOMEN - 2 VIEW COMPARISON:  08/16/2015 CT abdomen/pelvis FINDINGS: Enteric tube terminates in the distal stomach. There is persistent mild-to-moderate small bowel dilatation in the left and central abdomen measuring up to 3.8 cm diameter, with associated small bowel fluid levels, not definitely changed. Minimal gas in the colon. No evidence of pneumatosis or pneumoperitoneum. Excreted contrast is seen in the bladder. Clear lung bases. IMPRESSION: No appreciable change in distal small bowel obstruction. Electronically Signed   By: JIlona SorrelM.D.   On: 08/17/2015 08:52    Medications / Allergies:  Scheduled Meds: . dextrose      .  diatrizoate meglumine-sodium  90 mL Per NG tube Once   Continuous Infusions: . dextrose 5 % and 0.45% NaCl 100 mL/hr at 08/17/15 0932   PRN Meds:.albuterol, HYDROmorphone (DILAUDID) injection, ondansetron **OR** ondansetron (ZOFRAN) IV  Antibiotics: Anti-infectives    None        Assessment/Plan S/o open appendectomy 5/17 in [place] SBO-AXR unchanged, no flatus.  Start SBO protocol.  Continue NGT to LIWS.  Mobilize.  Hydrate    [place], ANP-BC [facility]    08/17/2015 10:24 AM

## 2015-08-17 NOTE — H&P (Signed)
History and Physical    Meghan Barrett HBZ:[id number] DOB: 1970/04/29 DOA: 08/16/2015  Referring MD/NP/PA: Dr. Billy Fischer PCP: Default, Provider, MD  Patient coming from: Home  Chief Complaint: Abdominal pain nausea and vomiting  HPI: Meghan Barrett is a 45 y.o. female with medical history significant of diabetes mellitus type 2, anxiety, depression, schzophrenia, HLD, and asthma; who presents with complaints of abdominal pain with nausea and vomiting for the last 2 days ago (5/29). Patient is from [place] and was down here visiting during the [place] holiday. She recently had been diagnosed with appendicitis and underwent a open appendectomy on 5/18. She reports she was feeling well and had been moving her bowels every other day until 4 days ago (5/25). Normally patient can drink coffee maker bowels move, but this stopped working. 2 days ago she had one "runny stool with hard pebbles" that morning.  Thereafter, she noted sharp stabbing generalized(worse in lower) abdominal pain that were 10 out of 10 on the pain scale. Associated symptoms included hot/cold sweats, nausea and vomiting unable to keep any food or liquids down. Estimates that she vomited at least 10 times. She denies having any redness or irritation around the previous surgery site. Denies having any shortness of breath, bleeding, or dysuria.  ED Course:  Upon admission to the emergency department patient was noted be afebrile with vital signs relatively within normal limits. CT scan of the abdomen showed dilation of small bowel loops to 4.2 cm with focal decompression at the mid lower abdomen along the mid to distal ileum. There is mild diverticulosis along the ascending colon but no acute diverticulitis. General surgery was consulted and recommended NG tube to suction and for hospitalist admission.  Review of Systems: As per HPI otherwise 10 point review of systems negative.   Past Medical History  Diagnosis Date  . Asthma    . Hernia   . High cholesterol   . Diabetes mellitus ([place]) dx'd 06/19/2013  . Anxiety   . Depression   . Schizophrenia [facility])     Past Surgical History  Procedure Laterality Date  . Cesarean section  1991; 1999; 2002  . Finger fracture surgery Left 2010  . Tubal ligation  2002  . Appendectomy  08-06-15     reports that she has been smoking Cigarettes.  She has smoked for the past 25 years. She has never used smokeless tobacco. She reports that she drinks about 0.6 oz of alcohol per week. She reports that she uses illicit drugs ("Crack" cocaine).  Allergies  Allergen Reactions  . Penicillins Anaphylaxis    History reviewed. No pertinent family history.  Prior to Admission medications   Medication Sig Start Date End Date Taking? Authorizing Provider  acetaminophen (TYLENOL) 500 MG tablet Take 500 mg by mouth every 6 (six) hours as needed for moderate pain.    Yes Historical Provider, MD  atorvastatin (LIPITOR) 20 MG tablet Take 20 mg by mouth daily.   Yes Historical Provider, MD  ciprofloxacin (CIPRO) 500 MG tablet Take 1,000 mg by mouth 3 (three) times daily.   Yes Historical Provider, MD  Insulin Glargine (LANTUS) 100 UNIT/ML Solostar Pen Inject 18 Units into the skin daily at 10 pm. 06/22/13  Yes Adeline C Viyuoh, MD  insulin lispro (HUMALOG) 100 UNIT/ML KiwkPen Subcutaneous, 3 times daily with meals  Correction coverage: Moderate CBG < 70: implement low blood sugar protocol as directed CBG 70 - 120: 0 units  CBG 121 - 150: 2 units  CBG  151 - 200: 3 units  CBG 201 - 250: 5 units  CBG 251 - 300: 8 units  CBG 301 - 350: 11 units  CBG 351 - 400: 15 units  CBG > 400: call your MD/PCP as directed 06/22/13  Yes Adeline C Viyuoh, MD  metroNIDAZOLE (FLAGYL) 500 MG tablet Take 500 mg by mouth 2 (two) times daily.   Yes Historical Provider, MD  QUEtiapine (SEROQUEL) 400 MG tablet Take 400 mg by mouth 2 (two) times daily.   Yes Historical Provider, MD  ciprofloxacin (CIPRO) 500 MG tablet  Take 1 tablet (500 mg total) by mouth 2 (two) times daily. 06/22/13   Sheila Oats, MD  glucose monitoring kit (FREESTYLE) monitoring kit Glucometer to check blood sugars before meals and at bedtime as directed 06/22/13   Sheila Oats, MD  Insulin Pen Needle (INSUPEN PEN NEEDLES) 32G X 4 MM MISC For use with Lantus and Humalog insulin Pens as directed 06/22/13   Sheila Oats, MD  Lancets (FREESTYLE) lancets Use as instructed 06/22/13   Sheila Oats, MD  living well with diabetes book MISC 1 each by Does not apply route once. 06/22/13   Sheila Oats, MD    Physical Exam: Filed Vitals:   08/17/15 0000 08/17/15 0030 08/17/15 0100 08/17/15 0130  BP: 102/83 103/57 111/64 103/56  Pulse: 78 68 70 64  Temp:      TempSrc:      Resp:   16   Height:      Weight:      SpO2: 100% 100% 100% 96%      Constitutional: NAD, calm, comfortable Filed Vitals:   08/17/15 0000 08/17/15 0030 08/17/15 0100 08/17/15 0130  BP: 102/83 103/57 111/64 103/56  Pulse: 78 68 70 64  Temp:      TempSrc:      Resp:   16   Height:      Weight:      SpO2: 100% 100% 100% 96%   Eyes: PERRL, lids and conjunctivae normal ENMT: Mucous membranes are moist. Posterior pharynx clear of any exudate or lesions.Normal dentition.  Neck: normal, supple, no masses, no thyromegaly Respiratory: clear to auscultation bilaterally, no wheezing, no crackles. Normal respiratory effort. No accessory muscle use.  Cardiovascular: Regular rate and rhythm, no murmurs / rubs / gallops. No extremity edema. 2+ pedal pulses. No carotid bruits.  Abdomen: Mild lower abdominal tenderness, no masses palpated. No hepatosplenomegaly. Bowel sounds hypoactive Musculoskeletal: no clubbing / cyanosis. No joint deformity upper and lower extremities. Good ROM, no contractures. Normal muscle tone.  Skin: no rashes, lesions, ulcers. No indurationAppendectomy surgical site appears to be healing well Neurologic: CN 2-12 grossly intact. Sensation  intact, DTR normal. Strength 5/5 in all 4.  Psychiatric: Normal judgment and insight. Alert and oriented x 3. Normal mood.     Labs on Admission: I have personally reviewed following labs and imaging studies  CBC:  Recent Labs Lab 08/16/15 1539  WBC 7.1  HGB 12.3  HCT 37.8  MCV 92.9  PLT 009   Basic Metabolic Panel:  Recent Labs Lab 08/16/15 1539  NA 136  K 3.9  CL 98*  CO2 27  GLUCOSE 93  BUN 14  CREATININE 1.14*  CALCIUM 9.8   GFR: Estimated Creatinine Clearance: 56.7 mL/min (by C-G formula based on Cr of 1.14). Liver Function Tests:  Recent Labs Lab 08/16/15 1539  AST 18  ALT 13*  ALKPHOS 45  BILITOT 1.0  PROT 7.6  ALBUMIN 4.1    Recent Labs Lab 08/16/15 1539  LIPASE 26   No results for input(s): AMMONIA in the last 168 hours. Coagulation Profile: No results for input(s): INR, PROTIME in the last 168 hours. Cardiac Enzymes: No results for input(s): CKTOTAL, CKMB, CKMBINDEX, TROPONINI in the last 168 hours. BNP (last 3 results) No results for input(s): PROBNP in the last 8760 hours. HbA1C: No results for input(s): HGBA1C in the last 72 hours. CBG: No results for input(s): GLUCAP in the last 168 hours. Lipid Profile: No results for input(s): CHOL, HDL, LDLCALC, TRIG, CHOLHDL, LDLDIRECT in the last 72 hours. Thyroid Function Tests: No results for input(s): TSH, T4TOTAL, FREET4, T3FREE, THYROIDAB in the last 72 hours. Anemia Panel: No results for input(s): VITAMINB12, FOLATE, FERRITIN, TIBC, IRON, RETICCTPCT in the last 72 hours. Urine analysis:    Component Value Date/Time   COLORURINE AMBER* 08/16/2015 1555   APPEARANCEUR CLOUDY* 08/16/2015 1555   LABSPEC 1.034* 08/16/2015 1555   PHURINE 5.5 08/16/2015 1555   GLUCOSEU NEGATIVE 08/16/2015 1555   HGBUR MODERATE* 08/16/2015 1555   BILIRUBINUR SMALL* 08/16/2015 1555   KETONESUR >80* 08/16/2015 1555   PROTEINUR 30* 08/16/2015 1555   UROBILINOGEN 0.2 06/19/2013 1922   NITRITE NEGATIVE  08/16/2015 1555   LEUKOCYTESUR TRACE* 08/16/2015 1555   Sepsis Labs: No results found for this or any previous visit (from the past 240 hour(s)).   Radiological Exams on Admission: Ct Abdomen Pelvis W Contrast  08/16/2015  CLINICAL DATA:  Acute onset of generalized abdominal pain and vomiting. Status post recent appendectomy. Initial encounter. EXAM: CT ABDOMEN AND PELVIS WITH CONTRAST TECHNIQUE: Multidetector CT imaging of the abdomen and pelvis was performed using the standard protocol following bolus administration of intravenous contrast. CONTRAST:  123m ISOVUE-300 IOPAMIDOL (ISOVUE-300) INJECTION 61% COMPARISON:  None. FINDINGS: The visualized lung bases are clear. There is dilatation of small-bowel loops up to 4.2 cm in maximal diameter, with focal decompression at the mid lower abdomen along the mid to distal ileum. Trace associated free fluid is seen. This most likely reflects small-bowel obstruction secondary to an underlying adhesion. The liver and spleen are unremarkable in appearance. The gallbladder is within normal limits. The pancreas and adrenal glands are unremarkable. The kidneys are unremarkable in appearance. There is no evidence of hydronephrosis. No renal or ureteral stones are seen. No perinephric stranding is appreciated. The stomach is within normal limits. No acute vascular abnormalities are seen. The appendix is not definitely characterized; there is no evidence for appendicitis. Mild diverticulosis is noted along the ascending colon. The colon is largely decompressed and is grossly unremarkable in appearance. The bladder is mildly distended and grossly unremarkable. The uterus is unremarkable in appearance. The ovaries are relatively symmetric. No suspicious adnexal masses are seen. A small amount of free fluid is seen within the pelvis. No inguinal lymphadenopathy is seen. No acute osseous abnormalities are identified. IMPRESSION: 1. Dilatation of small-bowel loops to 4.2 cm in  maximal diameter, with focal decompression at the mid lower abdomen along the mid to distal ileum. Trace associated free fluid seen. Small amount of free fluid tracks into the pelvis. This most likely reflects small bowel obstruction secondary to an underlying adhesion. 2. Mild diverticulosis along the ascending colon, without evidence of diverticulitis. These results were called by telephone at the time of interpretation on 08/16/2015 at 9:31 pm to Dr. EGareth Morgan who verbally acknowledged these results. Electronically Signed   By: JGarald BaldingM.D.   On: 08/16/2015 21:33  Assessment/Plan Small bowel obstruction: Acute. Patient with abdominal pain with nausea and vomiting. Recent appendicitis status post resection in [place] approximately 10 days ago. CT scan shows small bowel obstruction secondary to possible adhesion. - Patient to a MedSurg bed - NPO ok for ice chips - Continue NGT -  NS IV fluids at 123m/hr - Dilaudid prn - Surgery consulted and follow-up recommendations  Diabetes mellitus type 2 - held home insulin regimen - Hypoglycemic protocols - CBGs every 4 hours - Continue to monitor  Schizophrenia - Seroquel on hold   Hyperlipidemia - Atorvastatin on hold  Asthma history - Albuterol nebs prn  shortness of breath   DVT prophylaxis: SCD Code Status: Full Family Communication: None Disposition Plan:  Consults called: Surgery Admission status: Observation telemetry  RNorval MortonMD [facility] Pager [phone] If 7PM-7AM, please contact night-coverage [email or web address] Password [email or web address]  08/17/2015, 2:09 AM

## 2015-08-17 NOTE — ED Notes (Signed)
Attempted report 

## 2015-08-17 NOTE — Progress Notes (Signed)
Pt arrived to floor via stretcher. Pt was oriented to room & call bell system. VS have been taken. Pt in no apparent distress at this time. CCMD has been notified, Pt has been placed on tele & Safety video has been watched. 

## 2015-08-17 NOTE — Progress Notes (Signed)
Gastrografin given at 1230 patient tolerated well no complaints of pain of discomfort noted.

## 2015-08-17 NOTE — Progress Notes (Signed)
Patients CBG- 68.. 1 amp dextrose given per standing order.

## 2015-08-17 NOTE — Progress Notes (Signed)
Pt with bp running 90s/40s. Paged provider regarding bp and prn pain meds. Per provider, Katrinka BlazingSmith space pt's pain med to q4 and he may start her on some different iv fluids at an increased rate. Will await further orders and continue to monitor pt.

## 2015-08-17 NOTE — Progress Notes (Signed)
[facility]                                                                              Patient Demographics  Meghan Barrett, is a 45 y.o. female, DOB - 25-Jul-1970, ZOX:[id number]  Admit date - 08/16/2015   Admitting Physician Clydie Braun, MD  Outpatient Primary MD for the patient is Default, Provider, MD  Outpatient specialists:   LOS - 0  days    Chief Complaint  Patient presents with  . Abdominal Pain  . Emesis       Brief summary   Patient is a 45 year old female with DM type 2, anxiety, depression, schzophrenia, HLD, and asthma; presented with abdominal pain, nausea and vomiting for the last 2 days prior to admission. Patient is visiting from [place] for the [facility] holiday. She recently had been diagnosed with appendicitis and underwent a open appendectomy on 5/18. She reported she was feeling well and had been moving her bowels every other day until 4 days ago (5/25). Subsequently she noticed sharp stabbing pain in the lower abdomen with sweating, nausea, vomiting, intractable unable to hold anything down.  In ED CT of the abdomen showed dilation of small bowel loops to 4.2 cm with focal decompression at the mid lower abdomen along the mid to distal ileum. General surgery was consulted and recommended NG tube to suction.  Assessment & Plan    Principal Problem:   Small bowel obstruction (HCC)Presenting with intractable nausea and vomiting and abdominal pain in the setting of recent appendicitis status post resection in [place]  - CT scan showed small bowel obstruction possible addition - Currently no flatus or BM, continue NG to intermittent wall suction, IV fluid hydration, repeat x-rays - Out of bed, suppository - Gen. surgery consulted  Active Problems:   Schizophrenia (HCC) -  Seroquel currently on hold    Diabetes mellitus (HCC) - Currently hypoglycemic, hold insulin regimen, placed on IV fluid hydration with D5     Hyperlipidemia Atorvastatin on hold  Code Status: Full code  DVT Prophylaxis:   SCD's   Family Communication: Discussed in detail with the patient, all imaging results, lab results explained to the patient   Disposition Plan: When tolerating solid diet  Time Spent in minutes  25 minutes  Procedures:  None  Consultants:   Gen. surgery  Antimicrobials:   None   Medications  Scheduled Meds: . diatrizoate meglumine-sodium  90 mL Per NG tube Once   Continuous Infusions: . dextrose 5 % and 0.45% NaCl 100 mL/hr at 08/17/15 0932   PRN Meds:.albuterol, HYDROmorphone (DILAUDID) injection, ondansetron **OR** ondansetron (ZOFRAN) IV   Antibiotics   Anti-infectives    None        Subjective:   Meghan Barrett was seen and examined today. No BM or flatus. Denies any chest pain or shortness of breath. Patient denies  new weakness, numbess, tingling. No acute events overnight.  Abdominal pain currently controlled  Objective:   Filed Vitals:   08/17/15 0200 08/17/15 0231 08/17/15 0324 08/17/15 0512  BP:  94/43 102/73 90/45  Pulse:  58  57  Temp:  98.1  F (36.7 C)  98.9 F (37.2 C)  TempSrc:  Oral  Oral  Resp:  20  18  Height: 5\' 6"  (1.676 m) 5\' 6"  (1.676 m)    Weight: 55.792 kg (123 lb) 60.7 kg (133 lb 13.1 oz)    SpO2:  97%  96%    Intake/Output Summary (Last 24 hours) at 08/17/15 1045 Last data filed at 08/17/15 0747  Gross per 24 hour  Intake   1000 ml  Output    450 ml  Net    550 ml     Wt Readings from Last 3 Encounters:  08/17/15 60.7 kg (133 lb 13.1 oz)  06/19/13 68.765 kg (151 lb 9.6 oz)  03/18/13 69.945 kg (154 lb 3.2 oz)     Exam  General: Alert and oriented x 3, NAD  HEENT:  PERRLA, EOMI, Anicteric Sclera, mucous membranes moist.   Neck: Supple, no JVD, no masses  Cardiovascular: S1 S2 auscultated, no rubs, murmurs or gallops. Regular rate and rhythm.  Respiratory: Clear to auscultation bilaterally, no wheezing, rales or  rhonchi  Gastrointestinal: Soft, nontender, nondistended, Hypoactive bowel sounds, Right lower quadrant Steri-Strips  Ext: no cyanosis clubbing or edema  Neuro: AAOx3, Cr N's II- XII. Strength 5/5 upper and lower extremities bilaterally  Skin: No rashes  Psych: Normal affect and demeanor, alert and oriented x3    Data Reviewed:  I have personally reviewed following labs and imaging studies  Micro Results No results found for this or any previous visit (from the past 240 hour(s)).  Radiology Reports Ct Abdomen Pelvis W Contrast  08/16/2015  CLINICAL DATA:  Acute onset of generalized abdominal pain and vomiting. Status post recent appendectomy. Initial encounter. EXAM: CT ABDOMEN AND PELVIS WITH CONTRAST TECHNIQUE: Multidetector CT imaging of the abdomen and pelvis was performed using the standard protocol following bolus administration of intravenous contrast. CONTRAST:  ISOVUE-300 IOPAMIDOL (ISOVUE-300) INJECTION 61% COMPARISON:  None. FINDINGS: The visualized lung bases are clear. There is dilatation of small-bowel loops up to 4.2 cm in maximal diameter, with focal decompression at the mid lower abdomen along the mid to distal ileum. Trace associated free fluid is seen. This most likely reflects small-bowel obstruction secondary to an underlying adhesion. The liver and spleen are unremarkable in appearance. The gallbladder is within normal limits. The pancreas and adrenal glands are unremarkable. The kidneys are unremarkable in appearance. There is no evidence of hydronephrosis. No renal or ureteral stones are seen. No perinephric stranding is appreciated. The stomach is within normal limits. No acute vascular abnormalities are seen. The appendix is not definitely characterized; there is no evidence for appendicitis. Mild diverticulosis is noted along the ascending colon. The colon is largely decompressed and is grossly unremarkable in appearance. The bladder is mildly distended and  grossly unremarkable. The uterus is unremarkable in appearance. The ovaries are relatively symmetric. No suspicious adnexal masses are seen. A small amount of free fluid is seen within the pelvis. No inguinal lymphadenopathy is seen. No acute osseous abnormalities are identified. IMPRESSION: 1. Dilatation of small-bowel loops to 4.2 cm in maximal diameter, with focal decompression at the mid lower abdomen along the mid to distal ileum. Trace associated free fluid seen. Small amount of free fluid tracks into the pelvis. This most likely reflects small bowel obstruction secondary to an underlying adhesion. 2. Mild diverticulosis along the ascending colon, without evidence of diverticulitis. These results were called by telephone at the time of interpretation on 08/16/2015 at 9:31 pm to Dr. Denny Peon  [facility], who verbally acknowledged these results. Electronically Signed   By: Roanna RaiderJeffery  Chang M.D.   On: 08/16/2015 21:33   Dg Abd 2 Views  08/17/2015  CLINICAL DATA:  Follow-up small bowel obstruction EXAM: ABDOMEN - 2 VIEW COMPARISON:  08/16/2015 CT abdomen/pelvis FINDINGS: Enteric tube terminates in the distal stomach. There is persistent mild-to-moderate small bowel dilatation in the left and central abdomen measuring up to 3.8 cm diameter, with associated small bowel fluid levels, not definitely changed. Minimal gas in the colon. No evidence of pneumatosis or pneumoperitoneum. Excreted contrast is seen in the bladder. Clear lung bases. IMPRESSION: No appreciable change in distal small bowel obstruction. Electronically Signed   By: Delbert PhenixJason A Poff M.D.   On: 08/17/2015 08:52    Lab Data:  CBC:  Recent Labs Lab 08/16/15 1539 08/17/15 0436  WBC 7.1 7.9  HGB 12.3 10.1*  HCT 37.8 31.7*  MCV 92.9 95.2  PLT 375 319   Basic Metabolic Panel:  Recent Labs Lab 08/16/15 1539 08/17/15 0436 08/17/15 0602  NA 136 141  --   K 3.9 3.8  --   CL 98* 107  --   CO2 27 29  --   GLUCOSE 93 82  --   BUN 14 13  --    CREATININE 1.14* 1.07*  --   CALCIUM 9.8 8.4*  --   MG  --   --  1.8   GFR: Estimated Creatinine Clearance: 64.1 mL/min (by C-G formula based on Cr of 1.07). Liver Function Tests:  Recent Labs Lab 08/16/15 1539 08/17/15 0436  AST 18 14*  ALT 13* 11*  ALKPHOS 45 33*  BILITOT 1.0 0.5  PROT 7.6 5.8*  ALBUMIN 4.1 3.0*    Recent Labs Lab 08/16/15 1539  LIPASE 26   No results for input(s): AMMONIA in the last 168 hours. Coagulation Profile: No results for input(s): INR, PROTIME in the last 168 hours. Cardiac Enzymes: No results for input(s): CKTOTAL, CKMB, CKMBINDEX, TROPONINI in the last 168 hours. BNP (last 3 results) No results for input(s): PROBNP in the last 8760 hours. HbA1C: No results for input(s): HGBA1C in the last 72 hours. CBG:  Recent Labs Lab 08/17/15 0245 08/17/15 0813  GLUCAP 91 68   Lipid Profile: No results for input(s): CHOL, HDL, LDLCALC, TRIG, CHOLHDL, LDLDIRECT in the last 72 hours. Thyroid Function Tests: No results for input(s): TSH, T4TOTAL, FREET4, T3FREE, THYROIDAB in the last 72 hours. Anemia Panel: No results for input(s): VITAMINB12, FOLATE, FERRITIN, TIBC, IRON, RETICCTPCT in the last 72 hours. Urine analysis:    Component Value Date/Time   COLORURINE AMBER* 08/16/2015 1555   APPEARANCEUR CLOUDY* 08/16/2015 1555   LABSPEC 1.034* 08/16/2015 1555   PHURINE 5.5 08/16/2015 1555   GLUCOSEU NEGATIVE 08/16/2015 1555   HGBUR MODERATE* 08/16/2015 1555   BILIRUBINUR SMALL* 08/16/2015 1555   KETONESUR >80* 08/16/2015 1555   PROTEINUR 30* 08/16/2015 1555   UROBILINOGEN 0.2 06/19/2013 1922   NITRITE NEGATIVE 08/16/2015 1555   LEUKOCYTESUR TRACE* 08/16/2015 1555     Meghan Barrett M.D. [facility] 08/17/2015, 10:45 AM  Pager: [phone] Between 7am to 7pm - call Pager - [phone]  After 7pm go to [email or web address] - password [email or web address]  Call night coverage person covering after 7pm

## 2015-08-17 NOTE — Progress Notes (Signed)
Called to get report on pt. Nurse stated she would call me right back.

## 2015-08-18 ENCOUNTER — Inpatient Hospital Stay (HOSPITAL_COMMUNITY): Payer: Medicaid Other

## 2015-08-18 LAB — CBC
HEMATOCRIT: 30.9 % — AB (ref 36.0–46.0)
HEMOGLOBIN: 9.8 g/dL — AB (ref 12.0–15.0)
MCH: 30.2 pg (ref 26.0–34.0)
MCHC: 31.7 g/dL (ref 30.0–36.0)
MCV: 95.4 fL (ref 78.0–100.0)
Platelets: 294 10*3/uL (ref 150–400)
RBC: 3.24 MIL/uL — AB (ref 3.87–5.11)
RDW: 12.7 % (ref 11.5–15.5)
WBC: 6.9 10*3/uL (ref 4.0–10.5)

## 2015-08-18 LAB — BASIC METABOLIC PANEL
ANION GAP: 4 — AB (ref 5–15)
CHLORIDE: 106 mmol/L (ref 101–111)
CO2: 30 mmol/L (ref 22–32)
Calcium: 8.4 mg/dL — ABNORMAL LOW (ref 8.9–10.3)
Creatinine, Ser: 0.81 mg/dL (ref 0.44–1.00)
GFR calc Af Amer: >60 mL/min (ref >60)
GFR calc non Af Amer: >60 mL/min (ref >60)
Glucose, Bld: 121 mg/dL — ABNORMAL HIGH (ref 65–99)
POTASSIUM: 3.5 mmol/L (ref 3.5–5.1)
SODIUM: 140 mmol/L (ref 135–145)

## 2015-08-18 LAB — GLUCOSE, CAPILLARY
GLUCOSE-CAPILLARY: 116 mg/dL — AB (ref 65–99)
GLUCOSE-CAPILLARY: 101 mg/dL — AB (ref 65–99)
Glucose-Capillary: 103 mg/dL — ABNORMAL HIGH (ref 65–99)
Glucose-Capillary: 107 mg/dL — ABNORMAL HIGH (ref 65–99)
Glucose-Capillary: 122 mg/dL — ABNORMAL HIGH (ref 65–99)
Glucose-Capillary: 127 mg/dL — ABNORMAL HIGH (ref 65–99)

## 2015-08-18 MED ORDER — POLYETHYLENE GLYCOL 3350 17 G PO PACK
17.0000 g | PACK | Freq: Once | ORAL | Status: AC
  Administered 2015-08-18: 17 g via ORAL
  Filled 2015-08-18: qty 1

## 2015-08-18 MED ORDER — BISACODYL 10 MG RE SUPP
10.0000 mg | Freq: Once | RECTAL | Status: AC
  Administered 2015-08-18: 10 mg via RECTAL
  Filled 2015-08-18 (×2): qty 1

## 2015-08-18 MED ORDER — WHITE PETROLATUM GEL
Status: AC
  Administered 2015-08-18: 0.2
  Filled 2015-08-18: qty 1

## 2015-08-18 MED ORDER — SENNOSIDES-DOCUSATE SODIUM 8.6-50 MG PO TABS
2.0000 | ORAL_TABLET | Freq: Every day | ORAL | Status: DC
  Administered 2015-08-18: 2 via ORAL
  Filled 2015-08-18: qty 2

## 2015-08-18 NOTE — Care Management Note (Addendum)
Case Management Note  Patient Details  Name: Mancel BaleFatima Maffett MRN: [id number] Date of Birth: Jul 22, 1970  Subjective/Objective:             Admitted with SBO, [place]      Action/Plan: 08/20/2015 - discharge to home today  - clamp ngt and allow for clears, increase  mobilization   - CM to f/u with disposition needs  Expected Discharge Date:               Expected Discharge Plan:  Home/Self Care  In-House Referral:     Discharge planning Services  CM Consult  Post Acute Care Choice:    Choice offered to:     DME Arranged:    DME Agency:     HH Arranged:    HH Agency:     Status of Service:  In process, will continue to follow, COMPLETED 08/20/2015  Medicare Important Message Given:    Date Medicare IM Given:    Medicare IM give by:    Date Additional Medicare IM Given:    Additional Medicare Important Message give by:     If discussed at Long Length of Stay Meetings, dates discussed:    Additional Comments:  Kathryne SharperMonique Lemire (Sister) [phone], Morton PetersLakema Pizzini (Sister)  [phone]    Gae GallopCole, Orris Perin [place], ArizonaRN,BSN,CM [phone] 08/18/2015, 9:20 AM

## 2015-08-18 NOTE — Progress Notes (Signed)
Patient ID: Meghan Barrett, female   DOB: 1970-11-23, 45 y.o.   MRN: [id number]      [facility]      [address] [place], [place] [address]    Phone: [phone] FAX: [phone]     Subjective: Reports no flatus or BM.  Not getting oob.   Objective:  Vital signs:  Filed Vitals:   08/17/15 0324 08/17/15 0512 08/17/15 2235 08/18/15 0521  BP: 102/73 90/45 107/69 104/60  Pulse:  57 59 58  Temp:  98.9 F (37.2 C) 99.1 F (37.3 C) 98.4 F (36.9 C)  TempSrc:  Oral Oral   Resp:  _0 Height:      Weight:      SpO2:  96% 100% 95%    Last BM Date: 08/15/15  Intake/Output   Yesterday:  05/29 0701 - 05/30 0700 In: 1520 [P.O.:320; I.V.:1200] Out: 150 [Emesis/NG output:150] This shift:  Total I/O In: -  Out: 50 [Emesis/NG output:50]   Physical Exam: General: Pt awake/alert/oriented x4 in no acute distress  Abdomen: Soft. Nondistended. Non tender. rlq steri strips in place. No erythema. No evidence of peritonitis. No incarcerated hernias.   Problem List:   Principal Problem:   Small bowel obstruction (Meghan Barrett) Active Problems:   Schizophrenia (Meghan Barrett)   Diabetes mellitus (Meghan Barrett)   Hyperlipidemia   Meghan Barrett (small bowel obstruction) ([place])    Results:   Labs: Results for orders placed or performed during the hospital encounter of 08/16/15 (from the past 48 hour(s))  Lipase, blood     Status: None   Collection Time: 08/16/15  3:39 PM  Result Value Ref Range   Lipase 26 11 - 51 U/L  Comprehensive metabolic panel     Status: Abnormal   Collection Time: 08/16/15  3:39 PM  Result Value Ref Range   Sodium 136 135 - 145 mmol/L   Potassium 3.9 3.5 - 5.1 mmol/L   Chloride 98 (L) 101 - 111 mmol/L   CO2 27 22 - 32 mmol/L   Glucose, Bld 93 65 - 99 mg/dL   BUN 14 6 - 20 mg/dL   Creatinine, Ser 1.14 (H) 0.44 - 1.00 mg/dL   Calcium 9.8 8.9 - 10.3 mg/dL   Total Protein 7.6 6.5 - 8.1 g/dL   Albumin 4.1 3.5 - 5.0 g/dL   AST  18 15 - 41 U/L   ALT 13 (L) 14 - 54 U/L   Alkaline Phosphatase 45 38 - 126 U/L   Total Bilirubin 1.0 0.3 - 1.2 mg/dL   GFR calc non Af Amer 58 (L) >60 mL/min   GFR calc Af Amer >60 >60 mL/min    Comment: (NOTE) The eGFR has been calculated using the CKD EPI equation. This calculation has not been validated in all clinical situations. eGFR's persistently <60 mL/min signify possible Chronic Kidney Disease.    Anion gap 11 5 - 15  CBC     Status: None   Collection Time: 08/16/15  3:39 PM  Result Value Ref Range   WBC 7.1 4.0 - 10.5 K/uL   RBC 4.07 3.87 - 5.11 MIL/uL   Hemoglobin 12.3 12.0 - 15.0 g/dL   HCT 37.8 36.0 - 46.0 %   MCV 92.9 78.0 - 100.0 fL   MCH 30.2 26.0 - 34.0 pg   MCHC 32.5 30.0 - 36.0 g/dL   RDW 12.7 11.5 - 15.5 %   Platelets 375 150 - 400 K/uL  Urinalysis, Routine w  reflex microscopic     Status: Abnormal   Collection Time: 08/16/15  3:55 PM  Result Value Ref Range   Color, Urine AMBER (A) YELLOW    Comment: BIOCHEMICALS MAY BE AFFECTED BY COLOR   APPearance CLOUDY (A) CLEAR   Specific Gravity, Urine 1.034 (H) 1.005 - 1.030   pH 5.5 5.0 - 8.0   Glucose, UA NEGATIVE NEGATIVE mg/dL   Hgb urine dipstick MODERATE (A) NEGATIVE   Bilirubin Urine SMALL (A) NEGATIVE   Ketones, ur >80 (A) NEGATIVE mg/dL   Protein, ur 30 (A) NEGATIVE mg/dL   Nitrite NEGATIVE NEGATIVE   Leukocytes, UA TRACE (A) NEGATIVE  Urine microscopic-add on     Status: Abnormal   Collection Time: 08/16/15  3:55 PM  Result Value Ref Range   Squamous Epithelial / LPF 6-30 (A) NONE SEEN   WBC, UA 0-5 0 - 5 WBC/hpf   RBC / HPF 0-5 0 - 5 RBC/hpf   Bacteria, UA FEW (A) NONE SEEN   Urine-Other MUCOUS PRESENT   I-Stat beta hCG blood, ED     Status: None   Collection Time: 08/16/15  4:13 PM  Result Value Ref Range   I-stat hCG, quantitative <5.0 <5 mIU/mL   Comment 3            Comment:   GEST. AGE      CONC.  (mIU/mL)   <=1 WEEK        5 - 50     2 WEEKS       50 - 500     3 WEEKS       100 -  10,000     4 WEEKS     1,000 - 30,000        FEMALE AND NON-PREGNANT FEMALE:     LESS THAN 5 mIU/mL   Glucose, capillary     Status: None   Collection Time: 08/17/15  2:45 AM  Result Value Ref Range   Glucose-Capillary 91 65 - 99 mg/dL  Comprehensive metabolic panel     Status: Abnormal   Collection Time: 08/17/15  4:36 AM  Result Value Ref Range   Sodium 141 135 - 145 mmol/L   Potassium 3.8 3.5 - 5.1 mmol/L   Chloride 107 101 - 111 mmol/L   CO2 29 22 - 32 mmol/L   Glucose, Bld 82 65 - 99 mg/dL   BUN 13 6 - 20 mg/dL   Creatinine, Ser 1.07 (H) 0.44 - 1.00 mg/dL   Calcium 8.4 (L) 8.9 - 10.3 mg/dL   Total Protein 5.8 (L) 6.5 - 8.1 g/dL   Albumin 3.0 (L) 3.5 - 5.0 g/dL   AST 14 (L) 15 - 41 U/L   ALT 11 (L) 14 - 54 U/L   Alkaline Phosphatase 33 (L) 38 - 126 U/L   Total Bilirubin 0.5 0.3 - 1.2 mg/dL   GFR calc non Af Amer >60 >60 mL/min   GFR calc Af Amer >60 >60 mL/min    Comment: (NOTE) The eGFR has been calculated using the CKD EPI equation. This calculation has not been validated in all clinical situations. eGFR's persistently <60 mL/min signify possible Chronic Kidney Disease.    Anion gap 5 5 - 15  CBC     Status: Abnormal   Collection Time: 08/17/15  4:36 AM  Result Value Ref Range   WBC 7.9 4.0 - 10.5 K/uL   RBC 3.33 (L) 3.87 - 5.11 MIL/uL   Hemoglobin 10.1 (L) 12.0 -  15.0 g/dL   HCT 31.7 (L) 36.0 - 46.0 %   MCV 95.2 78.0 - 100.0 fL   MCH 30.3 26.0 - 34.0 pg   MCHC 31.9 30.0 - 36.0 g/dL   RDW 12.9 11.5 - 15.5 %   Platelets 319 150 - 400 K/uL  Magnesium     Status: None   Collection Time: 08/17/15  6:02 AM  Result Value Ref Range   Magnesium 1.8 1.7 - 2.4 mg/dL  Glucose, capillary     Status: None   Collection Time: 08/17/15  8:13 AM  Result Value Ref Range   Glucose-Capillary 68 65 - 99 mg/dL  Glucose, capillary     Status: None   Collection Time: 08/17/15 12:30 PM  Result Value Ref Range   Glucose-Capillary 98 65 - 99 mg/dL  Glucose, capillary     Status:  None   Collection Time: 08/17/15  3:47 PM  Result Value Ref Range   Glucose-Capillary 92 65 - 99 mg/dL  Glucose, capillary     Status: Abnormal   Collection Time: 08/17/15 10:00 PM  Result Value Ref Range   Glucose-Capillary 112 (H) 65 - 99 mg/dL  Glucose, capillary     Status: Abnormal   Collection Time: 08/18/15 12:00 AM  Result Value Ref Range   Glucose-Capillary 107 (H) 65 - 99 mg/dL  Glucose, capillary     Status: Abnormal   Collection Time: 08/18/15  3:49 AM  Result Value Ref Range   Glucose-Capillary 127 (H) 65 - 99 mg/dL  CBC     Status: Abnormal   Collection Time: 08/18/15  6:35 AM  Result Value Ref Range   WBC 6.9 4.0 - 10.5 K/uL   RBC 3.24 (L) 3.87 - 5.11 MIL/uL   Hemoglobin 9.8 (L) 12.0 - 15.0 g/dL   HCT 30.9 (L) 36.0 - 46.0 %   MCV 95.4 78.0 - 100.0 fL   MCH 30.2 26.0 - 34.0 pg   MCHC 31.7 30.0 - 36.0 g/dL   RDW 12.7 11.5 - 15.5 %   Platelets 294 150 - 400 K/uL  Glucose, capillary     Status: Abnormal   Collection Time: 08/18/15  7:46 AM  Result Value Ref Range   Glucose-Capillary 116 (H) 65 - 99 mg/dL    Imaging / Studies: Ct Abdomen Pelvis W Contrast  08/16/2015  CLINICAL DATA:  Acute onset of generalized abdominal pain and vomiting. Status post recent appendectomy. Initial encounter. EXAM: CT ABDOMEN AND PELVIS WITH CONTRAST TECHNIQUE: Multidetector CT imaging of the abdomen and pelvis was performed using the standard protocol following bolus administration of intravenous contrast. CONTRAST:  190m ISOVUE-300 IOPAMIDOL (ISOVUE-300) INJECTION 61% COMPARISON:  None. FINDINGS: The visualized lung bases are clear. There is dilatation of small-bowel loops up to 4.2 cm in maximal diameter, with focal decompression at the mid lower abdomen along the mid to distal ileum. Trace associated free fluid is seen. This most likely reflects small-bowel obstruction secondary to an underlying adhesion. The liver and spleen are unremarkable in appearance. The gallbladder is within  normal limits. The pancreas and adrenal glands are unremarkable. The kidneys are unremarkable in appearance. There is no evidence of hydronephrosis. No renal or ureteral stones are seen. No perinephric stranding is appreciated. The stomach is within normal limits. No acute vascular abnormalities are seen. The appendix is not definitely characterized; there is no evidence for appendicitis. Mild diverticulosis is noted along the ascending colon. The colon is largely decompressed and is grossly unremarkable in appearance. The bladder  is mildly distended and grossly unremarkable. The uterus is unremarkable in appearance. The ovaries are relatively symmetric. No suspicious adnexal masses are seen. A small amount of free fluid is seen within the pelvis. No inguinal lymphadenopathy is seen. No acute osseous abnormalities are identified. IMPRESSION: 1. Dilatation of small-bowel loops to 4.2 cm in maximal diameter, with focal decompression at the mid lower abdomen along the mid to distal ileum. Trace associated free fluid seen. Small amount of free fluid tracks into the pelvis. This most likely reflects small bowel obstruction secondary to an underlying adhesion. 2. Mild diverticulosis along the ascending colon, without evidence of diverticulitis. These results were called by telephone at the time of interpretation on 08/16/2015 at 9:31 pm to Dr. Gareth Morgan, who verbally acknowledged these results. Electronically Signed   By: Garald Balding M.D.   On: 08/16/2015 21:33   Dg Abd 2 Views  08/17/2015  CLINICAL DATA:  Follow-up small bowel obstruction EXAM: ABDOMEN - 2 VIEW COMPARISON:  08/16/2015 CT abdomen/pelvis FINDINGS: Enteric tube terminates in the distal stomach. There is persistent mild-to-moderate small bowel dilatation in the left and central abdomen measuring up to 3.8 cm diameter, with associated small bowel fluid levels, not definitely changed. Minimal gas in the colon. No evidence of pneumatosis or  pneumoperitoneum. Excreted contrast is seen in the bladder. Clear lung bases. IMPRESSION: No appreciable change in distal small bowel obstruction. Electronically Signed   By: Ilona Sorrel M.D.   On: 08/17/2015 08:52   Dg Abd Portable 1v-small Bowel Obstruction Protocol-initial, 8 Hr Delay  08/17/2015  CLINICAL DATA:  Small bowel protocol, 8 hour film. EXAM: PORTABLE ABDOMEN - 1 VIEW COMPARISON:  Radiograph earlier this day at 0828 hour FINDINGS: 8 hour film for small bowel obstruction protocol. There is enteric contrast in the stomach. Enteric contrast is seen in the ascending and descending colon. Persistent dilated small bowel loops in the left abdomen. No evidence of free air. IMPRESSION: Enteric contrast within the ascending and descending colon. Persistent small bowel dilatation in the central abdomen. Electronically Signed   By: Jeb Levering M.D.   On: 08/17/2015 21:07    Medications / Allergies:  Scheduled Meds: . bisacodyl  10 mg Rectal Once   Continuous Infusions: . dextrose 5 % and 0.45% NaCl 100 mL/hr at 08/18/15 0437   PRN Meds:.albuterol, HYDROmorphone (DILAUDID) injection, ondansetron **OR** ondansetron (ZOFRAN) IV  Antibiotics: Anti-infectives    None         Assessment/Plan S/o open appendectomy 5/17 in [place] Meghan Barrett-AXR shows contrast in the colon.  Will clamp ngt and allow for clears.  She needs to mobilize, did not get OOB at all yesterday.   Erby Pian, [facility] Surgery Pager [phone]) For consults and floor pages call [phone](7A-4:30P)  08/18/2015 8:45 AM

## 2015-08-18 NOTE — Progress Notes (Signed)
[facility]                                                                              Patient Demographics  Meghan Barrett, is a 45 y.o. female, DOB - February 02, 1971, ZOX:[id number]  Admit date - 08/16/2015   Admitting Physician Clydie Braunondell A Smith, MD  Outpatient Primary MD for the patient is Default, Provider, MD  Outpatient specialists:   LOS - 1  days    Chief Complaint  Patient presents with  . Abdominal Pain  . Emesis       Brief summary   Patient is a 45 year old female with DM type 2, anxiety, depression, schzophrenia, HLD, and asthma; presented with abdominal pain, nausea and vomiting for the last 2 days prior to admission. [place] [facility]. She recently had been diagnosed with appendicitis and underwent a open appendectomy on 5/18. She reported she was feeling well and had been moving her bowels every other day until 4 days ago (5/25). Subsequently she noticed sharp stabbing pain in the lower abdomen with sweating, nausea, vomiting, intractable unable to hold anything down.  In ED CT of the abdomen showed dilation of small bowel loops to 4.2 cm with focal decompression at the mid lower abdomen along the mid to distal ileum. General surgery was consulted and recommended NG tube to suction.  Assessment & Plan    Principal Problem:   Small bowel obstruction (HCC)Presenting with intractable nausea and vomiting and abdominal pain in the setting of recent appendicitis status post resection in [place]  - CT scan showed small bowel obstruction possible addition - Abdominal x-ray 5/29 showed enteric contrast in the ascending and descending colon, repeat today pending - Surgery following, plan to clamp NG today and allow for clears.  - Placed on suppository, needs to mobilize, out of bed and ambulate   Active Problems:   Schizophrenia (HCC) -  Seroquel currently on hold    Diabetes mellitus (HCC) - Currently  hypoglycemic, hold insulin regimen, placed on IV fluid hydration with D5    Hyperlipidemia Atorvastatin on hold  Code Status: Full code  DVT Prophylaxis:   SCD's   Family Communication: Discussed in detail with the patient, all imaging results, lab results explained to the patient   Disposition Plan: When tolerating solid diet  Time Spent in minutes  15 minutes  Procedures:  None  Consultants:   Gen. surgery  Antimicrobials:   None   Medications  Scheduled Meds: . bisacodyl  10 mg Rectal Once   Continuous Infusions: . dextrose 5 % and 0.45% NaCl 100 mL/hr at 08/18/15 0437   PRN Meds:.albuterol, HYDROmorphone (DILAUDID) injection, ondansetron **OR** ondansetron (ZOFRAN) IV   Antibiotics   Anti-infectives    None        Subjective:   Meghan BaleFatima Barrett was seen and examined today.States no BM or flatus. Has not gotten out of the bed, no fevers or chills. Denies any chest pain or shortness of breath. Patient denies  new weakness, numbess, tingling. No acute events overnight.   Objective:   Filed Vitals:   08/17/15 [id number] 08/17/15 [id number]  08/17/15 2235 08/18/15 0521  BP: 102/73 90/45 107/69 104/60  Pulse:  57 59 58  Temp:  98.9 F (37.2 C) 99.1 F (37.3 C) 98.4 F (36.9 C)  TempSrc:  Oral Oral   Resp:  18 18 20   Height:      Weight:      SpO2:  96% 100% 95%    Intake/Output Summary (Last 24 hours) at 08/18/15 1032 Last data filed at 08/18/15 0804  Gross per 24 hour  Intake   1400 ml  Output     50 ml  Net   1350 ml     Wt Readings from Last 3 Encounters:  08/17/15 60.7 kg (133 lb 13.1 oz)  06/19/13 68.765 kg (151 lb 9.6 oz)  03/18/13 69.945 kg (154 lb 3.2 oz)     Exam  General: Alert and oriented x 3, NAD  HEENT:     Neck:   Cardiovascular: S1 S2 auscultated, no rubs, murmurs or gallops. Regular rate and rhythm.  Respiratory: Clear to auscultation bilaterally, no wheezing, rales or rhonchi  Gastrointestinal: Soft, nontender,  nondistended, Hypoactive bowel sounds, Right lower quadrant Steri-Strips  Ext: no cyanosis clubbing or edema  Neuro: No new deficits  Skin: No rashes  Psych: Normal affect and demeanor, alert and oriented x3    Data Reviewed:  I have personally reviewed following labs and imaging studies  Micro Results No results found for this or any previous visit (from the past 240 hour(s)).  Radiology Reports Ct Abdomen Pelvis W Contrast  08/16/2015  CLINICAL DATA:  Acute onset of generalized abdominal pain and vomiting. Status post recent appendectomy. Initial encounter. EXAM: CT ABDOMEN AND PELVIS WITH CONTRAST TECHNIQUE: Multidetector CT imaging of the abdomen and pelvis was performed using the standard protocol following bolus administration of intravenous contrast. CONTRAST:  ISOVUE-300 IOPAMIDOL (ISOVUE-300) INJECTION 61% COMPARISON:  None. FINDINGS: The visualized lung bases are clear. There is dilatation of small-bowel loops up to 4.2 cm in maximal diameter, with focal decompression at the mid lower abdomen along the mid to distal ileum. Trace associated free fluid is seen. This most likely reflects small-bowel obstruction secondary to an underlying adhesion. The liver and spleen are unremarkable in appearance. The gallbladder is within normal limits. The pancreas and adrenal glands are unremarkable. The kidneys are unremarkable in appearance. There is no evidence of hydronephrosis. No renal or ureteral stones are seen. No perinephric stranding is appreciated. The stomach is within normal limits. No acute vascular abnormalities are seen. The appendix is not definitely characterized; there is no evidence for appendicitis. Mild diverticulosis is noted along the ascending colon. The colon is largely decompressed and is grossly unremarkable in appearance. The bladder is mildly distended and grossly unremarkable. The uterus is unremarkable in appearance. The ovaries are relatively symmetric. No  suspicious adnexal masses are seen. A small amount of free fluid is seen within the pelvis. No inguinal lymphadenopathy is seen. No acute osseous abnormalities are identified. IMPRESSION: 1. Dilatation of small-bowel loops to 4.2 cm in maximal diameter, with focal decompression at the mid lower abdomen along the mid to distal ileum. Trace associated free fluid seen. Small amount of free fluid tracks into the pelvis. This most likely reflects small bowel obstruction secondary to an underlying adhesion. 2. Mild diverticulosis along the ascending colon, without evidence of diverticulitis. These results were called by telephone at the time of interpretation on 08/16/2015 at 9:31 pm to Dr. Alvira Monday, who verbally acknowledged these results. Electronically Signed  By: Roanna Raider M.D.   On: 08/16/2015 21:33   Dg Abd 2 Views  08/17/2015  CLINICAL DATA:  Follow-up small bowel obstruction EXAM: ABDOMEN - 2 VIEW COMPARISON:  08/16/2015 CT abdomen/pelvis FINDINGS: Enteric tube terminates in the distal stomach. There is persistent mild-to-moderate small bowel dilatation in the left and central abdomen measuring up to 3.8 cm diameter, with associated small bowel fluid levels, not definitely changed. Minimal gas in the colon. No evidence of pneumatosis or pneumoperitoneum. Excreted contrast is seen in the bladder. Clear lung bases. IMPRESSION: No appreciable change in distal small bowel obstruction. Electronically Signed   By: Delbert Phenix M.D.   On: 08/17/2015 08:52   Dg Abd Portable 1v-small Bowel Obstruction Protocol-initial, 8 Hr Delay  08/17/2015  CLINICAL DATA:  Small bowel protocol, 8 hour film. EXAM: PORTABLE ABDOMEN - 1 VIEW COMPARISON:  Radiograph earlier this day at 0828 hour FINDINGS: 8 hour film for small bowel obstruction protocol. There is enteric contrast in the stomach. Enteric contrast is seen in the ascending and descending colon. Persistent dilated small bowel loops in the left abdomen. No  evidence of free air. IMPRESSION: Enteric contrast within the ascending and descending colon. Persistent small bowel dilatation in the central abdomen. Electronically Signed   By: Rubye Oaks M.D.   On: 08/17/2015 21:07    Lab Data:  CBC:  Recent Labs Lab 08/16/15 1539 08/17/15 0436 08/18/15 0635  WBC 7.1 7.9 6.9  HGB 12.3 10.1* 9.8*  HCT 37.8 31.7* 30.9*  MCV 92.9 95.2 95.4  PLT 375 319 294   Basic Metabolic Panel:  Recent Labs Lab 08/16/15 1539 08/17/15 0436 08/17/15 0602 08/18/15 0635  NA 136 141  --  140  K 3.9 3.8  --  3.5  CL 98* 107  --  106  CO2 27 29  --  30  GLUCOSE 93 82  --  121*  BUN 14 13  --  <5*  CREATININE 1.14* 1.07*  --  0.81  CALCIUM 9.8 8.4*  --  8.4*  MG  --   --  1.8  --    GFR: Estimated Creatinine Clearance: 84.7 mL/min (by C-G formula based on Cr of 0.81). Liver Function Tests:  Recent Labs Lab 08/16/15 1539 08/17/15 0436  AST 18 14*  ALT 13* 11*  ALKPHOS 45 33*  BILITOT 1.0 0.5  PROT 7.6 5.8*  ALBUMIN 4.1 3.0*    Recent Labs Lab 08/16/15 1539  LIPASE 26   No results for input(s): AMMONIA in the last 168 hours. Coagulation Profile: No results for input(s): INR, PROTIME in the last 168 hours. Cardiac Enzymes: No results for input(s): CKTOTAL, CKMB, CKMBINDEX, TROPONINI in the last 168 hours. BNP (last 3 results) No results for input(s): PROBNP in the last 8760 hours. HbA1C: No results for input(s): HGBA1C in the last 72 hours. CBG:  Recent Labs Lab 08/17/15 1547 08/17/15 2200 08/18/15 08/18/15 0349 08/18/15 0746  GLUCAP 92 112* 107* 127* 116*   Lipid Profile: No results for input(s): CHOL, HDL, LDLCALC, TRIG, CHOLHDL, LDLDIRECT in the last 72 hours. Thyroid Function Tests: No results for input(s): TSH, T4TOTAL, FREET4, T3FREE, THYROIDAB in the last 72 hours. Anemia Panel: No results for input(s): VITAMINB12, FOLATE, FERRITIN, TIBC, IRON, RETICCTPCT in the last 72 hours. Urine analysis:    Component Value  Date/Time   COLORURINE AMBER* 08/16/2015 1555   APPEARANCEUR CLOUDY* 08/16/2015 1555   LABSPEC 1.034* 08/16/2015 1555   PHURINE 5.5 08/16/2015 1555   GLUCOSEU NEGATIVE  08/16/2015 1555   HGBUR MODERATE* 08/16/2015 1555   BILIRUBINUR SMALL* 08/16/2015 1555   KETONESUR >80* 08/16/2015 1555   PROTEINUR 30* 08/16/2015 1555   UROBILINOGEN 0.2 06/19/2013 1922   NITRITE NEGATIVE 08/16/2015 1555   LEUKOCYTESUR TRACE* 08/16/2015 1555     Tobias Avitabile M.D. [facility] 08/18/2015, 10:32 AM  Pager: [phone] Between 7am to 7pm - call Pager - [phone]  After 7pm go to [email or web address] - password [email or web address]  Call night coverage person covering after 7pm

## 2015-08-19 LAB — CBC
HCT: 31.2 % — ABNORMAL LOW (ref 36.0–46.0)
Hemoglobin: 10.1 g/dL — ABNORMAL LOW (ref 12.0–15.0)
MCH: 30.3 pg (ref 26.0–34.0)
MCHC: 32.4 g/dL (ref 30.0–36.0)
MCV: 93.7 fL (ref 78.0–100.0)
PLATELETS: 306 10*3/uL (ref 150–400)
RBC: 3.33 MIL/uL — AB (ref 3.87–5.11)
RDW: 12.3 % (ref 11.5–15.5)
WBC: 6 10*3/uL (ref 4.0–10.5)

## 2015-08-19 LAB — GLUCOSE, CAPILLARY
GLUCOSE-CAPILLARY: 103 mg/dL — AB (ref 65–99)
GLUCOSE-CAPILLARY: 119 mg/dL — AB (ref 65–99)
GLUCOSE-CAPILLARY: 99 mg/dL (ref 65–99)
Glucose-Capillary: 113 mg/dL — ABNORMAL HIGH (ref 65–99)
Glucose-Capillary: 114 mg/dL — ABNORMAL HIGH (ref 65–99)
Glucose-Capillary: 85 mg/dL (ref 65–99)
Glucose-Capillary: 99 mg/dL (ref 65–99)

## 2015-08-19 LAB — BASIC METABOLIC PANEL
Anion gap: 6 (ref 5–15)
CALCIUM: 8.8 mg/dL — AB (ref 8.9–10.3)
CO2: 31 mmol/L (ref 22–32)
CREATININE: 0.89 mg/dL (ref 0.44–1.00)
Chloride: 101 mmol/L (ref 101–111)
Glucose, Bld: 115 mg/dL — ABNORMAL HIGH (ref 65–99)
Potassium: 3.2 mmol/L — ABNORMAL LOW (ref 3.5–5.1)
SODIUM: 138 mmol/L (ref 135–145)

## 2015-08-19 MED ORDER — POLYETHYLENE GLYCOL 3350 17 G PO PACK: 17.0000 g | PACK | Freq: Every day | ORAL | Status: DC | PRN

## 2015-08-19 MED ORDER — POLYETHYLENE GLYCOL 3350 17 G PO PACK
17.0000 g | PACK | Freq: Every day | ORAL | Status: DC
  Administered 2015-08-19: 17 g via ORAL
  Filled 2015-08-19: qty 1

## 2015-08-19 MED ORDER — BISACODYL 10 MG RE SUPP
10.0000 mg | Freq: Once | RECTAL | Status: AC
  Administered 2015-08-19: 10 mg via RECTAL
  Filled 2015-08-19: qty 1

## 2015-08-19 NOTE — Progress Notes (Signed)
Patient ID: Meghan Barrett, female   DOB: 04-06-1970, 45 y.o.   MRN: [id number]     [address] [place], [place] [address]    Phone: [phone] FAX: [phone]     Subjective: Runny bm this AM.  Tolerated clears.  No n/v or bloating.   Objective:  Vital signs:  Filed Vitals:   08/18/15 0521 08/18/15 1537 08/18/15 2110 08/19/15 0457  BP: 104/60 110/59 108/63 94/58  Pulse: 58 69 63 74  Temp: 98.4 F (36.9 C) 98.8 F (37.1 C) 99.2 F (37.3 C) 98.4 F (36.9 C)  TempSrc:  Oral    Resp: _0 Height:      Weight:      SpO2: 95% 97% 98% 92%    Last BM Date: 08/18/15  Intake/Output   Yesterday:  05/30 0701 - 05/31 0700 In: 1740 [P.O.:540; I.V.:1200] Out: 1600 [Urine:1550; Emesis/NG output:50] This shift:     Physical Exam: General: Pt awake/alert/oriented x4 in no acute distress  Abdomen: Soft. Nondistended. Non tender. rlq steri strips in place. No erythema. No evidence of peritonitis. No incarcerated hernias.   Problem List:   Principal Problem:   Small bowel obstruction (HCC) Active Problems:   Schizophrenia ([place])   Diabetes mellitus ([place])   Hyperlipidemia   SBO (small bowel obstruction) ([place])    Results:   Labs: Results for orders placed or performed during the hospital encounter of 08/16/15 (from the past 48 hour(s))  Glucose, capillary     Status: None   Collection Time: 08/17/15 12:30 PM  Result Value Ref Range   Glucose-Capillary 98 65 - 99 mg/dL  Glucose, capillary     Status: None   Collection Time: 08/17/15  3:47 PM  Result Value Ref Range   Glucose-Capillary 92 65 - 99 mg/dL  Glucose, capillary     Status: Abnormal   Collection Time: 08/17/15 10:00 PM  Result Value Ref Range   Glucose-Capillary 112 (H) 65 - 99 mg/dL  Glucose, capillary     Status: Abnormal   Collection Time: 08/18/15 12:00 AM  Result Value Ref Range   Glucose-Capillary 107 (H) 65 - 99 mg/dL   Glucose, capillary     Status: Abnormal   Collection Time: 08/18/15  3:49 AM  Result Value Ref Range   Glucose-Capillary 127 (H) 65 - 99 mg/dL  Basic metabolic panel     Status: Abnormal   Collection Time: 08/18/15  6:35 AM  Result Value Ref Range   Sodium 140 135 - 145 mmol/L   Potassium 3.5 3.5 - 5.1 mmol/L   Chloride 106 101 - 111 mmol/L   CO2 30 22 - 32 mmol/L   Glucose, Bld 121 (H) 65 - 99 mg/dL   BUN <5 (L) 6 - 20 mg/dL   Creatinine, Ser 0.81 0.44 - 1.00 mg/dL   Calcium 8.4 (L) 8.9 - 10.3 mg/dL   GFR calc non Af Amer >60 >60 mL/min   GFR calc Af Amer >60 >60 mL/min    Comment: (NOTE) The eGFR has been calculated using the CKD EPI equation. This calculation has not been validated in all clinical situations. eGFR's persistently <60 mL/min signify possible Chronic Kidney Disease.    Anion gap 4 (L) 5 - 15  CBC     Status: Abnormal   Collection Time: 08/18/15  6:35 AM  Result Value Ref Range   WBC 6.9 4.0 - 10.5 K/uL  RBC 3.24 (L) 3.87 - 5.11 MIL/uL   Hemoglobin 9.8 (L) 12.0 - 15.0 g/dL   HCT 30.9 (L) 36.0 - 46.0 %   MCV 95.4 78.0 - 100.0 fL   MCH 30.2 26.0 - 34.0 pg   MCHC 31.7 30.0 - 36.0 g/dL   RDW 12.7 11.5 - 15.5 %   Platelets 294 150 - 400 K/uL  Glucose, capillary     Status: Abnormal   Collection Time: 08/18/15  7:46 AM  Result Value Ref Range   Glucose-Capillary 116 (H) 65 - 99 mg/dL  Glucose, capillary     Status: Abnormal   Collection Time: 08/18/15 12:09 PM  Result Value Ref Range   Glucose-Capillary 101 (H) 65 - 99 mg/dL  Glucose, capillary     Status: Abnormal   Collection Time: 08/18/15  5:00 PM  Result Value Ref Range   Glucose-Capillary 122 (H) 65 - 99 mg/dL  Glucose, capillary     Status: Abnormal   Collection Time: 08/18/15  8:30 PM  Result Value Ref Range   Glucose-Capillary 103 (H) 65 - 99 mg/dL   Comment 1 Notify RN    Comment 2 Document in Chart   Glucose, capillary     Status: Abnormal   Collection Time: 08/19/15 12:04 AM  Result  Value Ref Range   Glucose-Capillary 119 (H) 65 - 99 mg/dL   Comment 1 Notify RN    Comment 2 Document in Chart   Glucose, capillary     Status: Abnormal   Collection Time: 08/19/15  4:39 AM  Result Value Ref Range   Glucose-Capillary 103 (H) 65 - 99 mg/dL  Basic metabolic panel     Status: Abnormal   Collection Time: 08/19/15  6:21 AM  Result Value Ref Range   Sodium 138 135 - 145 mmol/L   Potassium 3.2 (L) 3.5 - 5.1 mmol/L   Chloride 101 101 - 111 mmol/L   CO2 31 22 - 32 mmol/L   Glucose, Bld 115 (H) 65 - 99 mg/dL   BUN <5 (L) 6 - 20 mg/dL   Creatinine, Ser 0.89 0.44 - 1.00 mg/dL   Calcium 8.8 (L) 8.9 - 10.3 mg/dL   GFR calc non Af Amer >60 >60 mL/min   GFR calc Af Amer >60 >60 mL/min    Comment: (NOTE) The eGFR has been calculated using the CKD EPI equation. This calculation has not been validated in all clinical situations. eGFR's persistently <60 mL/min signify possible Chronic Kidney Disease.    Anion gap 6 5 - 15  CBC     Status: Abnormal   Collection Time: 08/19/15  6:21 AM  Result Value Ref Range   WBC 6.0 4.0 - 10.5 K/uL   RBC 3.33 (L) 3.87 - 5.11 MIL/uL   Hemoglobin 10.1 (L) 12.0 - 15.0 g/dL   HCT 31.2 (L) 36.0 - 46.0 %   MCV 93.7 78.0 - 100.0 fL   MCH 30.3 26.0 - 34.0 pg   MCHC 32.4 30.0 - 36.0 g/dL   RDW 12.3 11.5 - 15.5 %   Platelets 306 150 - 400 K/uL  Glucose, capillary     Status: Abnormal   Collection Time: 08/19/15  7:51 AM  Result Value Ref Range   Glucose-Capillary 114 (H) 65 - 99 mg/dL    Imaging / Studies: Dg Abd 2 Views  08/18/2015  CLINICAL DATA:  Small bowel obstruction. Abdominal pain, nausea and vomiting. EXAM: ABDOMEN - 2 VIEW COMPARISON:  08/17/2015, 08/16/2015 FINDINGS: Nasogastric tube continues to  extend to the region of the proximal duodenum. There is further transit of oral contrast into the colon. Stable mildly dilated central small bowel loops remain. No free air. IMPRESSION: Findings consistent with improving partial small bowel  obstruction. There is further transit of oral contrast into the colon. Electronically Signed   By: Aletta Edouard M.D.   On: 08/18/2015 10:37   Dg Abd Portable 1v-small Bowel Obstruction Protocol-initial, 8 Hr Delay  08/17/2015  CLINICAL DATA:  Small bowel protocol, 8 hour film. EXAM: PORTABLE ABDOMEN - 1 VIEW COMPARISON:  Radiograph earlier this day at 0828 hour FINDINGS: 8 hour film for small bowel obstruction protocol. There is enteric contrast in the stomach. Enteric contrast is seen in the ascending and descending colon. Persistent dilated small bowel loops in the left abdomen. No evidence of free air. IMPRESSION: Enteric contrast within the ascending and descending colon. Persistent small bowel dilatation in the central abdomen. Electronically Signed   By: Jeb Levering M.D.   On: 08/17/2015 21:07    Medications / Allergies:  Scheduled Meds: . polyethylene glycol  17 g Oral Daily  . senna-docusate  2 tablet Oral QHS   Continuous Infusions: . dextrose 5 % and 0.45% NaCl 100 mL/hr at 08/19/15 1019   PRN Meds:.albuterol, HYDROmorphone (DILAUDID) injection, ondansetron **OR** ondansetron (ZOFRAN) IV  Antibiotics: Anti-infectives    None        Assessment/Plan S/o open appendectomy 5/17 in [place]. advance to fulls, then as tolerated.  Continue with miralax/senna  Erby Pian, [facility] Surgery Pager [phone]) For consults and floor pages call [phone]([id number]:30P)  08/19/2015 10:44 AM

## 2015-08-19 NOTE — Progress Notes (Signed)
[facility]                                                                              Patient Demographics  Meghan Barrett, is a 45 y.o. female, DOB - 03/18/1971, ZOX:[id number]  Admit date - 08/16/2015   Admitting Physician Clydie Braun, MD  Outpatient Primary MD for the patient is Default, Provider, MD  Outpatient specialists:   LOS - 2  days    Chief Complaint  Patient presents with  . Abdominal Pain  . Emesis       Brief summary   Patient is a 45 year old female with DM type 2, anxiety, depression, schzophrenia, HLD, and asthma; presented with abdominal pain, nausea and vomiting for the last 2 days prior to admission. Patient is visiting from [place] for the [facility] Day holiday. She recently had been diagnosed with appendicitis and underwent a open appendectomy on 5/18. She reported she was feeling well and had been moving her bowels every other day until 4 days ago (5/25). Subsequently she noticed sharp stabbing pain in the lower abdomen with sweating, nausea, vomiting, intractable unable to hold anything down.  In ED CT of the abdomen showed dilation of small bowel loops to 4.2 cm with focal decompression at the mid lower abdomen along the mid to distal ileum. General surgery was consulted and recommended NG tube to suction.  Assessment & Plan    Principal Problem:   Small bowel obstruction (HCC)Presenting with intractable nausea and vomiting and abdominal pain in the setting of recent appendicitis status post resection in [place]  - CT scan showed small bowel obstruction possible addition -Abdominal x-ray on 5/30 showed improving partial SBO - Advance diet as tolerated per surgery - BM today  Active Problems:   Schizophrenia (HCC) -  Seroquel currently on hold    Diabetes mellitus (HCC) - Continue gentle hydration with D5 until tolerating at least full liquid diet  DVT prophylaxis   SCD's   Family Communication: Discussed in detail  with the patient, all imaging results, lab results explained to the patient   Disposition Plan: When tolerating solid diet  Time Spent in minutes  15 minutes  Procedures:  None  Consultants:   Gen. surgery  Antimicrobials:   None   Medications  Scheduled Meds: . polyethylene glycol  17 g Oral Daily  . senna-docusate  2 tablet Oral QHS   Continuous Infusions: . dextrose 5 % and 0.45% NaCl 100 mL/hr at 08/19/15 1053   PRN Meds:.albuterol, HYDROmorphone (DILAUDID) injection, ondansetron **OR** ondansetron (ZOFRAN) IV   Antibiotics   Anti-infectives    None        Subjective:   Meghan Barrett was seen and examined today. Pain is controlled, BM today. No fevers or chills. Denies any chest pain or shortness of breath. Patient denies  new weakness, numbess, tingling. No acute events overnight.   Objective:   Filed Vitals:   08/18/15 0521 08/18/15 1537 08/18/15 2110 08/19/15 0457  BP: 104/60 110/59 108/63 94/58  Pulse: 58 69 63 74  Temp: 98.4 F (36.9 C) 98.8 F (37.1 C) 99.2 F (37.3 C) 98.4 F (36.9  C)  TempSrc:  Oral    Resp: 20 16 18 18   Height:      Weight:      SpO2: 95% 97% 98% 92%    Intake/Output Summary (Last 24 hours) at 08/19/15 1310 Last data filed at 08/19/15 1201  Gross per 24 hour  Intake   1500 ml  Output   2350 ml  Net   -850 ml     Wt Readings from Last 3 Encounters:  08/17/15 60.7 kg (133 lb 13.1 oz)  06/19/13 68.765 kg (151 lb 9.6 oz)  03/18/13 69.945 kg (154 lb 3.2 oz)     Exam  General: Alert and oriented x 3, NAD  HEENT:     Neck:   Cardiovascular: S1 S2 Clear, RRR  Respiratory: CTAB  Gastrointestinal: Soft, nontender, ND, NBS, RLQ Steri-Strips  Ext: no cyanosis clubbing or edema  Neuro: No new deficits  Skin: No rashes  Psych: Normal affect and demeanor, alert and oriented x3    Data Reviewed:  I have personally reviewed following labs and imaging studies  Micro Results No results found for this or any  previous visit (from the past 240 hour(s)).  Radiology Reports Ct Abdomen Pelvis W Contrast  08/16/2015  CLINICAL DATA:  Acute onset of generalized abdominal pain and vomiting. Status post recent appendectomy. Initial encounter. EXAM: CT ABDOMEN AND PELVIS WITH CONTRAST TECHNIQUE: Multidetector CT imaging of the abdomen and pelvis was performed using the standard protocol following bolus administration of intravenous contrast. CONTRAST:  100mL ISOVUE-300 IOPAMIDOL (ISOVUE-300) INJECTION 61% COMPARISON:  None. FINDINGS: The visualized lung bases are clear. There is dilatation of small-bowel loops up to 4.2 cm in maximal diameter, with focal decompression at the mid lower abdomen along the mid to distal ileum. Trace associated free fluid is seen. This most likely reflects small-bowel obstruction secondary to an underlying adhesion. The liver and spleen are unremarkable in appearance. The gallbladder is within normal limits. The pancreas and adrenal glands are unremarkable. The kidneys are unremarkable in appearance. There is no evidence of hydronephrosis. No renal or ureteral stones are seen. No perinephric stranding is appreciated. The stomach is within normal limits. No acute vascular abnormalities are seen. The appendix is not definitely characterized; there is no evidence for appendicitis. Mild diverticulosis is noted along the ascending colon. The colon is largely decompressed and is grossly unremarkable in appearance. The bladder is mildly distended and grossly unremarkable. The uterus is unremarkable in appearance. The ovaries are relatively symmetric. No suspicious adnexal masses are seen. A small amount of free fluid is seen within the pelvis. No inguinal lymphadenopathy is seen. No acute osseous abnormalities are identified. IMPRESSION: 1. Dilatation of small-bowel loops to 4.2 cm in maximal diameter, with focal decompression at the mid lower abdomen along the mid to distal ileum. Trace associated free  fluid seen. Small amount of free fluid tracks into the pelvis. This most likely reflects small bowel obstruction secondary to an underlying adhesion. 2. Mild diverticulosis along the ascending colon, without evidence of diverticulitis. These results were called by telephone at the time of interpretation on 08/16/2015 at 9:31 pm to Dr. Alvira MondayERIN SCHLOSSMAN, who verbally acknowledged these results. Electronically Signed   By: Roanna RaiderJeffery  Chang M.D.   On: 08/16/2015 21:33   Dg Abd 2 Views  08/18/2015  CLINICAL DATA:  Small bowel obstruction. Abdominal pain, nausea and vomiting. EXAM: ABDOMEN - 2 VIEW COMPARISON:  08/17/2015, 08/16/2015 FINDINGS: Nasogastric tube continues to extend to the region of the proximal duodenum.  There is further transit of oral contrast into the colon. Stable mildly dilated central small bowel loops remain. No free air. IMPRESSION: Findings consistent with improving partial small bowel obstruction. There is further transit of oral contrast into the colon. Electronically Signed   By: Irish Lack M.D.   On: 08/18/2015 10:37   Dg Abd 2 Views  08/17/2015  CLINICAL DATA:  Follow-up small bowel obstruction EXAM: ABDOMEN - 2 VIEW COMPARISON:  08/16/2015 CT abdomen/pelvis FINDINGS: Enteric tube terminates in the distal stomach. There is persistent mild-to-moderate small bowel dilatation in the left and central abdomen measuring up to 3.8 cm diameter, with associated small bowel fluid levels, not definitely changed. Minimal gas in the colon. No evidence of pneumatosis or pneumoperitoneum. Excreted contrast is seen in the bladder. Clear lung bases. IMPRESSION: No appreciable change in distal small bowel obstruction. Electronically Signed   By: Delbert Meghan M.D.   On: 08/17/2015 08:52   Dg Abd Portable 1v-small Bowel Obstruction Protocol-initial, 8 Hr Delay  08/17/2015  CLINICAL DATA:  Small bowel protocol, 8 hour film. EXAM: PORTABLE ABDOMEN - 1 VIEW COMPARISON:  Radiograph earlier this day at 0828  hour FINDINGS: 8 hour film for small bowel obstruction protocol. There is enteric contrast in the stomach. Enteric contrast is seen in the ascending and descending colon. Persistent dilated small bowel loops in the left abdomen. No evidence of free air. IMPRESSION: Enteric contrast within the ascending and descending colon. Persistent small bowel dilatation in the central abdomen. Electronically Signed   By: Rubye Oaks M.D.   On: 08/17/2015 21:07    Lab Data:  CBC:  Recent Labs Lab 08/16/15 1539 08/17/15 0436 08/18/15 0635 08/19/15 0621  WBC 7.1 7.9 6.9 6.0  HGB 12.3 10.1* 9.8* 10.1*  HCT 37.8 31.7* 30.9* 31.2*  MCV 92.9 95.2 95.4 93.7  PLT 375 319 294 306   Basic Metabolic Panel:  Recent Labs Lab 08/16/15 1539 08/17/15 0436 08/17/15 0602 08/18/15 0635 08/19/15 0621  NA 136 141  --  140 138  K 3.9 3.8  --  3.5 3.2*  CL 98* 107  --  106 101  CO2 27 29  --  30 31  GLUCOSE 93 82  --  121* 115*  BUN 14 13  --  <5* <5*  CREATININE 1.14* 1.07*  --  0.81 0.89  CALCIUM 9.8 8.4*  --  8.4* 8.8*  MG  --   --  1.8  --   --    GFR: Estimated Creatinine Clearance: 77.1 mL/min (by C-G formula based on Cr of 0.89). Liver Function Tests:  Recent Labs Lab 08/16/15 1539 08/17/15 0436  AST 18 14*  ALT 13* 11*  ALKPHOS 45 33*  BILITOT 1.0 0.5  PROT 7.6 5.8*  ALBUMIN 4.1 3.0*    Recent Labs Lab 08/16/15 1539  LIPASE 26   No results for input(s): AMMONIA in the last 168 hours. Coagulation Profile: No results for input(s): INR, PROTIME in the last 168 hours. Cardiac Enzymes: No results for input(s): CKTOTAL, CKMB, CKMBINDEX, TROPONINI in the last 168 hours. BNP (last 3 results) No results for input(s): PROBNP in the last 8760 hours. HbA1C: No results for input(s): HGBA1C in the last 72 hours. CBG:  Recent Labs Lab 08/18/15 2030 08/19/15 0004 08/19/15 0439 08/19/15 0751 08/19/15 1135  GLUCAP 103* 119* 103* 114* 99   Lipid Profile: No results for input(s):  CHOL, HDL, LDLCALC, TRIG, CHOLHDL, LDLDIRECT in the last 72 hours. Thyroid Function Tests: No results  for input(s): TSH, T4TOTAL, FREET4, T3FREE, THYROIDAB in the last 72 hours. Anemia Panel: No results for input(s): VITAMINB12, FOLATE, FERRITIN, TIBC, IRON, RETICCTPCT in the last 72 hours. Urine analysis:    Component Value Date/Time   COLORURINE AMBER* 08/16/2015 1555   APPEARANCEUR CLOUDY* 08/16/2015 1555   LABSPEC 1.034* 08/16/2015 1555   PHURINE 5.5 08/16/2015 1555   GLUCOSEU NEGATIVE 08/16/2015 1555   HGBUR MODERATE* 08/16/2015 1555   BILIRUBINUR SMALL* 08/16/2015 1555   KETONESUR >80* 08/16/2015 1555   PROTEINUR 30* 08/16/2015 1555   UROBILINOGEN 0.2 06/19/2013 1922   NITRITE NEGATIVE 08/16/2015 1555   LEUKOCYTESUR TRACE* 08/16/2015 1555     Naysa Puskas M.D. [facility] 08/19/2015, 1:10 PM  Pager: [phone] Between 7am to 7pm - call Pager - [phone]  After 7pm go to [email or web address] - password [email or web address]  Call night coverage person covering after 7pm

## 2015-08-19 NOTE — Progress Notes (Addendum)
Checked for residual from NGT =30cc.flushed with air

## 2015-08-19 NOTE — Progress Notes (Signed)
Checked for residual from NGT & it's 0;flushed with air

## 2015-08-20 LAB — BASIC METABOLIC PANEL
Anion gap: 3 — ABNORMAL LOW (ref 5–15)
CHLORIDE: 105 mmol/L (ref 101–111)
CO2: 32 mmol/L (ref 22–32)
CREATININE: 0.91 mg/dL (ref 0.44–1.00)
Calcium: 8.5 mg/dL — ABNORMAL LOW (ref 8.9–10.3)
GFR calc non Af Amer: >60 mL/min (ref >60)
Glucose, Bld: 101 mg/dL — ABNORMAL HIGH (ref 65–99)
POTASSIUM: 3.3 mmol/L — AB (ref 3.5–5.1)
Sodium: 140 mmol/L (ref 135–145)

## 2015-08-20 LAB — CBC
HEMATOCRIT: 29.4 % — AB (ref 36.0–46.0)
Hemoglobin: 9.6 g/dL — ABNORMAL LOW (ref 12.0–15.0)
MCH: 30.2 pg (ref 26.0–34.0)
MCHC: 32.7 g/dL (ref 30.0–36.0)
MCV: 92.5 fL (ref 78.0–100.0)
PLATELETS: 270 10*3/uL (ref 150–400)
RBC: 3.18 MIL/uL — AB (ref 3.87–5.11)
RDW: 12.1 % (ref 11.5–15.5)
WBC: 4.4 10*3/uL (ref 4.0–10.5)

## 2015-08-20 LAB — GLUCOSE, CAPILLARY
GLUCOSE-CAPILLARY: 103 mg/dL — AB (ref 65–99)
Glucose-Capillary: 110 mg/dL — ABNORMAL HIGH (ref 65–99)
Glucose-Capillary: 96 mg/dL (ref 65–99)

## 2015-08-20 MED ORDER — SENNOSIDES-DOCUSATE SODIUM 8.6-50 MG PO TABS: 2.0000 | ORAL_TABLET | Freq: Every evening | ORAL | Status: DC | PRN

## 2015-08-20 MED ORDER — TRAMADOL HCL 50 MG PO TABS: 50.0000 mg | ORAL_TABLET | Freq: Three times a day (TID) | ORAL | Status: DC | PRN

## 2015-08-20 MED ORDER — POTASSIUM CHLORIDE CRYS ER 20 MEQ PO TBCR
40.0000 meq | EXTENDED_RELEASE_TABLET | Freq: Once | ORAL | Status: AC
  Administered 2015-08-20: 40 meq via ORAL
  Filled 2015-08-20: qty 2

## 2015-08-20 MED ORDER — TRAMADOL HCL 50 MG PO TABS
50.0000 mg | ORAL_TABLET | Freq: Four times a day (QID) | ORAL | Status: DC | PRN
  Administered 2015-08-20: 50 mg via ORAL
  Filled 2015-08-20: qty 1

## 2015-08-20 MED ORDER — BISACODYL 10 MG RE SUPP: 10.0000 mg | Freq: Once | RECTAL | Status: DC

## 2015-08-20 MED ORDER — SIMETHICONE 80 MG PO CHEW
160.0000 mg | CHEWABLE_TABLET | Freq: Four times a day (QID) | ORAL | Status: DC | PRN
  Administered 2015-08-20: 160 mg via ORAL
  Filled 2015-08-20: qty 2

## 2015-08-20 MED ORDER — SENNOSIDES-DOCUSATE SODIUM 8.6-50 MG PO TABS
1.0000 | ORAL_TABLET | Freq: Two times a day (BID) | ORAL | Status: DC
  Administered 2015-08-20: 1 via ORAL
  Filled 2015-08-20: qty 1

## 2015-08-20 MED ORDER — PROMETHAZINE HCL 25 MG PO TABS: 25.0000 mg | ORAL_TABLET | Freq: Four times a day (QID) | ORAL | Status: DC | PRN

## 2015-08-20 NOTE — Progress Notes (Signed)
D/c instructions reviewed with pt. Copy of instructions and scripts given to pt. Assisted pt by [place] her sister [place], which in turn she will call the cousin that is here locally and can come and pick pt up for discharge home.  Sister, Gabriel RungMonique calling cousin Alvis LemmingsDawn and will have her call pt in her room.

## 2015-08-20 NOTE — Discharge Summary (Signed)
Physician Discharge Summary   Patient ID: Meghan Barrett MRN: [id number] DOB/AGE: 07/16/1970 45 y.o.  Admit date: 08/16/2015 Discharge date: 08/20/2015  Primary Care Physician: Patient is visiting from [place]   Discharge Diagnoses:    . Small bowel obstruction ([place]) . Schizophrenia ([place]) . Hyperlipidemia  Consults: Gen. surgery  Recommendations for Outpatient Follow-up:  1. Please repeat CBC/BMET at next visit   DIET: Heart healthy diet    Allergies:   Allergies  Allergen Reactions  . Penicillins Anaphylaxis     DISCHARGE MEDICATIONS: Current Discharge Medication List    START taking these medications   Details  promethazine (PHENERGAN) 25 MG tablet Take 1 tablet (25 mg total) by mouth every 6 (six) hours as needed for nausea or vomiting. Qty: 30 tablet, Refills: 0    senna-docusate (SENOKOT-S) 8.6-50 MG tablet Take 2 tablets by mouth at bedtime as needed for mild constipation or moderate constipation. Also available over the counter Qty: 60 tablet, Refills: 0    traMADol (ULTRAM) 50 MG tablet Take 1 tablet (50 mg total) by mouth every 8 (eight) hours as needed for moderate pain or severe pain. Qty: 30 tablet, Refills: 0      CONTINUE these medications which have NOT CHANGED   Details  acetaminophen (TYLENOL) 500 MG tablet Take 500 mg by mouth every 6 (six) hours as needed for moderate pain.     atorvastatin (LIPITOR) 20 MG tablet Take 20 mg by mouth daily.    Insulin Glargine (LANTUS) 100 UNIT/ML Solostar Pen Inject 18 Units into the skin daily at 10 pm. Qty: 15 mL, Refills: 11    insulin lispro (HUMALOG) 100 UNIT/ML KiwkPen Subcutaneous, 3 times daily with meals  Correction coverage: Moderate CBG < 70: implement low blood sugar protocol as directed CBG 70 - 120: 0 units  CBG 121 - 150: 2 units  CBG 151 - 200: 3 units  CBG 201 - 250: 5 units  CBG 251 - 300: 8 units  CBG 301 - 350: 11 units  CBG 351 - 400: 15 units  CBG > 400: call your MD/PCP as  directed Qty: 15 mL, Refills: 11    QUEtiapine (SEROQUEL) 400 MG tablet Take 400 mg by mouth 2 (two) times daily.    glucose monitoring kit (FREESTYLE) monitoring kit Glucometer to check blood sugars before meals and at bedtime as directed Qty: 1 each, Refills: 0    Insulin Pen Needle (INSUPEN PEN NEEDLES) 32G X 4 MM MISC For use with Lantus and Humalog insulin Pens as directed Qty: 100 each, Refills: 0    Lancets (FREESTYLE) lancets Use as instructed Qty: 100 each, Refills: 12    living well with diabetes book MISC 1 each by Does not apply route once.      STOP taking these medications     ciprofloxacin (CIPRO) 500 MG tablet      metroNIDAZOLE (FLAGYL) 500 MG tablet      ciprofloxacin (CIPRO) 500 MG tablet          Brief H and P: For complete details please refer to admission H and P, but in briefPatient is a 45 year old female with DM type 2, anxiety, depression, schzophrenia, HLD, and asthma; presented with abdominal pain, nausea and vomiting for the last 2 days prior to admission. Patient is visiting from [place] Day holiday. She recently had been diagnosed with appendicitis and underwent a open appendectomy on 5/18. She reported she was feeling well and had been moving  her bowels every other day until 4 days ago (5/25). Subsequently she noticed sharp stabbing pain in the lower abdomen with sweating, nausea, vomiting, intractable unable to hold anything down.  In ED CT of the abdomen showed dilation of small bowel loops to 4.2 cm with focal decompression at the mid lower abdomen along the mid to distal ileum. General surgery was consulted and recommended NG tube to suction.  Hospital Course:   Small bowel obstruction (HCC)Presenting with intractable nausea and vomiting and abdominal pain in the setting of recent appendicitis status post resection in [place]  - CT scan showed small bowel obstruction. Patient was admitted, Gen. surgery was consulted.  Patient was placed on NPO status, IV fluids, NG tube suction. -Abdominal x-ray on 5/30 showed improving partial SBO. Patient had bowel movements on 5/31. She has been able to tolerate an advanced diet. She is currently tolerating solid diet and ambulating without any difficulty. Continue stool softeners, patient prefers Dulcolax suppository and Senokot-S.    Schizophrenia ([place]) - Continue Seroquel   Diabetes mellitus ([place]) Stable, continue insulin regimen  Day of Discharge BP 93/52 mmHg  Pulse 54  Temp(Src) 98.7 F (37.1 C) (Oral)  Resp 17  Ht '5\' 6"'$  (1.676 m)  Wt 60.7 kg (133 lb 13.1 oz)  BMI 21.61 kg/m2  SpO2 99%  LMP 08/09/2015  Physical Exam: General: Alert and awake oriented x3 not in any acute distress. HEENT: anicteric sclera, pupils reactive to light and accommodation CVS: S1-S2 clear no murmur rubs or gallops Chest: clear to auscultation bilaterally, no wheezing rales or rhonchi Abdomen: soft nontender, nondistended, normal bowel sounds Extremities: no cyanosis, clubbing or edema noted bilaterally Neuro: Cranial nerves II-XII intact, no focal neurological deficits   The results of significant diagnostics from this hospitalization (including imaging, microbiology, ancillary and laboratory) are listed below for reference.    LAB RESULTS: Basic Metabolic Panel:  Recent Labs Lab 08/17/15 0602  08/19/15 0621 08/20/15 0539  NA  --   < > 138 140  K  --   < > 3.2* 3.3*  CL  --   < > 101 105  CO2  --   < > 31 32  GLUCOSE  --   < > 115* 101*  BUN  --   < > <5* <5*  CREATININE  --   < > 0.89 0.91  CALCIUM  --   < > 8.8* 8.5*  MG 1.8  --   --   --   < > = values in this interval not displayed. Liver Function Tests:  Recent Labs Lab 08/16/15 1539 08/17/15 0436  AST 18 14*  ALT 13* 11*  ALKPHOS 45 33*  BILITOT 1.0 0.5  PROT 7.6 5.8*  ALBUMIN 4.1 3.0*    Recent Labs Lab 08/16/15 1539  LIPASE 26   No results for input(s): AMMONIA in the last 168  hours. CBC:  Recent Labs Lab 08/19/15 0621 08/20/15 0539  WBC 6.0 4.4  HGB 10.1* 9.6*  HCT 31.2* 29.4*  MCV 93.7 92.5  PLT 306 270   Cardiac Enzymes: No results for input(s): CKTOTAL, CKMB, CKMBINDEX, TROPONINI in the last 168 hours. BNP: Invalid input(s): POCBNP CBG:  Recent Labs Lab 08/20/15 0745 08/20/15 1117  GLUCAP 96 103*    Significant Diagnostic Studies:  Ct Abdomen Pelvis W Contrast  08/16/2015  CLINICAL DATA:  Acute onset of generalized abdominal pain and vomiting. Status post recent appendectomy. Initial encounter. EXAM: CT ABDOMEN AND PELVIS WITH CONTRAST TECHNIQUE: Multidetector CT  imaging of the abdomen and pelvis was performed using the standard protocol following bolus administration of intravenous contrast. CONTRAST:  126m ISOVUE-300 IOPAMIDOL (ISOVUE-300) INJECTION 61% COMPARISON:  None. FINDINGS: The visualized lung bases are clear. There is dilatation of small-bowel loops up to 4.2 cm in maximal diameter, with focal decompression at the mid lower abdomen along the mid to distal ileum. Trace associated free fluid is seen. This most likely reflects small-bowel obstruction secondary to an underlying adhesion. The liver and spleen are unremarkable in appearance. The gallbladder is within normal limits. The pancreas and adrenal glands are unremarkable. The kidneys are unremarkable in appearance. There is no evidence of hydronephrosis. No renal or ureteral stones are seen. No perinephric stranding is appreciated. The stomach is within normal limits. No acute vascular abnormalities are seen. The appendix is not definitely characterized; there is no evidence for appendicitis. Mild diverticulosis is noted along the ascending colon. The colon is largely decompressed and is grossly unremarkable in appearance. The bladder is mildly distended and grossly unremarkable. The uterus is unremarkable in appearance. The ovaries are relatively symmetric. No suspicious adnexal masses are  seen. A small amount of free fluid is seen within the pelvis. No inguinal lymphadenopathy is seen. No acute osseous abnormalities are identified. IMPRESSION: 1. Dilatation of small-bowel loops to 4.2 cm in maximal diameter, with focal decompression at the mid lower abdomen along the mid to distal ileum. Trace associated free fluid seen. Small amount of free fluid tracks into the pelvis. This most likely reflects small bowel obstruction secondary to an underlying adhesion. 2. Mild diverticulosis along the ascending colon, without evidence of diverticulitis. These results were called by telephone at the time of interpretation on 08/16/2015 at 9:31 pm to Dr. EGareth Morgan who verbally acknowledged these results. Electronically Signed   By: JGarald BaldingM.D.   On: 08/16/2015 21:33   Dg Abd 2 Views  08/17/2015  CLINICAL DATA:  Follow-up small bowel obstruction EXAM: ABDOMEN - 2 VIEW COMPARISON:  08/16/2015 CT abdomen/pelvis FINDINGS: Enteric tube terminates in the distal stomach. There is persistent mild-to-moderate small bowel dilatation in the left and central abdomen measuring up to 3.8 cm diameter, with associated small bowel fluid levels, not definitely changed. Minimal gas in the colon. No evidence of pneumatosis or pneumoperitoneum. Excreted contrast is seen in the bladder. Clear lung bases. IMPRESSION: No appreciable change in distal small bowel obstruction. Electronically Signed   By: JIlona SorrelM.D.   On: 08/17/2015 08:52   Dg Abd Portable 1v-small Bowel Obstruction Protocol-initial, 8 Hr Delay  08/17/2015  CLINICAL DATA:  Small bowel protocol, 8 hour film. EXAM: PORTABLE ABDOMEN - 1 VIEW COMPARISON:  Radiograph earlier this day at 0828 hour FINDINGS: 8 hour film for small bowel obstruction protocol. There is enteric contrast in the stomach. Enteric contrast is seen in the ascending and descending colon. Persistent dilated small bowel loops in the left abdomen. No evidence of free air. IMPRESSION:  Enteric contrast within the ascending and descending colon. Persistent small bowel dilatation in the central abdomen. Electronically Signed   By: MJeb LeveringM.D.   On: 08/17/2015 21:07    2D ECHO:   Disposition and Follow-up: Discharge Instructions    Diet - low sodium heart healthy    Complete by:  As directed      Increase activity slowly    Complete by:  As directed             DISPOSITION: Home   DISCHARGE FOLLOW-UP Follow-up Information  Schedule an appointment as soon as possible for a visit in 2 weeks to follow up.   Why:  for hospital follow-up   Contact information:   please follow-up with your PCP in [place].        Time spent on Discharge: 25 minutes  Signed:   RAI,RIPUDEEP M.D. [facility] 08/20/2015, 12:59 PM Pager: [phone]

## 2015-08-20 NOTE — Progress Notes (Signed)
Paged oncall NP regarding patient bp 93/52. Pt asymptomatic. Awaiting callback or orders placed. Will continue to monitor.

## 2015-08-20 NOTE — Progress Notes (Signed)
[facility] Progress Note     Subjective: Pt still has pain from bloating, but much improved.  No N/V, tolerating soft diet.  Ambulating some OOB.  Having diarrhea and cramping.  Wants to go home later today.  Objective: Vital signs in last 24 hours: Temp:  [98.6 F (37 C)-99.3 F (37.4 C)] 98.7 F (37.1 C) (06/01 0556) Pulse Rate:  [54-81] 54 (06/01 0556) Resp:  [17-21] 17 (06/01 0556) BP: (87-135)/(52-112) 93/52 mmHg (06/01 0556) SpO2:  [94 %-99 %] 99 % (06/01 0556) Last BM Date: 08/19/15  Intake/Output from previous day: 05/31 0701 - 06/01 0700 In: 1178.3 [I.V.:1178.3] Out: 1200 [Urine:1200] Intake/Output this shift: Total I/O In: 360 [P.O.:360] Out: 401 [Urine:400; Stool:1]  PE: Gen:  Alert, NAD, pleasant Abd: Soft, mild distension, minimal tenderness, +BS, no HSM, incisions C/D/I to RLQ   Lab Results:   Recent Labs  08/19/15 0621 08/20/15 0539  WBC 6.0 4.4  HGB 10.1* 9.6*  HCT 31.2* 29.4*  PLT 306 270   BMET  Recent Labs  08/19/15 0621 08/20/15 0539  NA 138 140  K 3.2* 3.3*  CL 101 105  CO2 31 32  GLUCOSE 115* 101*  BUN <5* <5*  CREATININE 0.89 0.91  CALCIUM 8.8* 8.5*   PT/INR No results for input(s): LABPROT, INR in the last 72 hours. CMP     Component Value Date/Time   NA 140 08/20/2015 0539   K 3.3* 08/20/2015 0539   CL 105 08/20/2015 0539   CO2 32 08/20/2015 0539   GLUCOSE 101* 08/20/2015 0539   BUN <5* 08/20/2015 0539   CREATININE 0.91 08/20/2015 0539   CALCIUM 8.5* 08/20/2015 0539   PROT 5.8* 08/17/2015 0436   ALBUMIN 3.0* 08/17/2015 0436   AST 14* 08/17/2015 0436   ALT 11* 08/17/2015 0436   ALKPHOS 33* 08/17/2015 0436   BILITOT 0.5 08/17/2015 0436   GFRNONAA >60 08/20/2015 0539   GFRAA >60 08/20/2015 0539   Lipase     Component Value Date/Time   LIPASE 26 08/16/2015 1539       Studies/Results: No results found.  Anti-infectives: Anti-infectives    None       Assessment/Plan S/o open  [place] SBO-resolved. Encouraged significantly more walking and hydration.  Tolerating soft diet. Continue with miralax/senna.  Add gasX.  Okay to discharge from our perspective.    LOS: 3 days    Nonie HoyerMegan N Chassidy Layson 08/20/2015, 11:05 AM Pager: [phone]  (7am - 4:30pm M-F; 7am - 11:30am Sa/Su)

## 2015-08-20 NOTE — Progress Notes (Signed)
Pt's family here to pick her up, pt d/c'd via wheelchair with her belongings, escorted by hospital volunteer.

## 2019-11-21 ENCOUNTER — Emergency Department (HOSPITAL_COMMUNITY): Payer: Medicaid Other

## 2019-11-21 ENCOUNTER — Encounter (HOSPITAL_COMMUNITY): Payer: Self-pay | Admitting: Emergency Medicine

## 2019-11-21 ENCOUNTER — Emergency Department (HOSPITAL_COMMUNITY)
Admission: EM | Admit: 2019-11-21 | Discharge: 2019-11-21 | Disposition: A | Discharge status: Home / Self Care | Payer: Medicaid Other | Attending: Emergency Medicine | Admitting: Emergency Medicine

## 2019-11-21 DIAGNOSIS — M545 Low back pain: Secondary | ICD-10-CM | POA: Diagnosis not present

## 2019-11-21 DIAGNOSIS — J45909 Unspecified asthma, uncomplicated: Secondary | ICD-10-CM | POA: Insufficient documentation

## 2019-11-21 DIAGNOSIS — E119 Type 2 diabetes mellitus without complications: Secondary | ICD-10-CM | POA: Insufficient documentation

## 2019-11-21 DIAGNOSIS — F17210 Nicotine dependence, cigarettes, uncomplicated: Secondary | ICD-10-CM | POA: Insufficient documentation

## 2019-11-21 DIAGNOSIS — M549 Dorsalgia, unspecified: Secondary | ICD-10-CM

## 2019-11-21 DIAGNOSIS — R1031 Right lower quadrant pain: Secondary | ICD-10-CM | POA: Diagnosis not present

## 2019-11-21 LAB — URINALYSIS, ROUTINE W REFLEX MICROSCOPIC
Bilirubin Urine: NEGATIVE
Glucose, UA: NEGATIVE mg/dL
Ketones, ur: NEGATIVE mg/dL
Leukocytes,Ua: NEGATIVE
Nitrite: NEGATIVE
Protein, ur: 30 mg/dL — AB
RBC / HPF: >50 RBC/hpf — ABNORMAL HIGH (ref 0–5)
Specific Gravity, Urine: 1.018 (ref 1.005–1.030)
pH: 5 (ref 5.0–8.0)

## 2019-11-21 LAB — POC URINE PREG, ED: Preg Test, Ur: NEGATIVE

## 2019-11-21 MED ORDER — DICLOFENAC SODIUM 1 % EX GEL: 2.0000 g | Freq: Four times a day (QID) | CUTANEOUS | 0 refills | Status: DC

## 2019-11-21 MED ORDER — IBUPROFEN 400 MG PO TABS: 400.0000 mg | ORAL_TABLET | Freq: Three times a day (TID) | ORAL | 0 refills | Status: DC | PRN

## 2019-11-21 NOTE — ED Triage Notes (Signed)
Per EMS-coming from home-back pain for 2 weeks-no injury-has not taken any OTC meds to treat pain

## 2019-11-21 NOTE — ED Notes (Signed)
Pt transported to CT ?

## 2019-11-21 NOTE — Discharge Instructions (Addendum)
Ms Biddinger, it was nice to meet you today. I think your back pain is related to a mild muscle injury. You can take alleve and tylenol for this. You can also try heat pads or ice packs.   If you develop worsening back pain, numbness, leg or arm weakness, fevers, incontinence please come back to the ED.  Please follow up with your PCP in 2 weeks.  Best wishes,  Dr Allena Katz

## 2019-11-21 NOTE — ED Provider Notes (Signed)
This patient was seen and evaluated by the resident physician initially.  On my exam, 3:48 PM she is awake, alert, sitting in the corner of the room. We discussed today's evaluation including reassuring CT scan.  Patient will be discharged with appropriate anti-inflammatories.    Gerhard Munch, MD 11/21/19 [phone]

## 2019-11-21 NOTE — ED Provider Notes (Signed)
[place] DEPT Provider Note   CSN: [id number] Arrival date & time: 11/21/19  0931     History Chief Complaint  Patient presents with  . Back Pain    Meghan Barrett is a 49 y.o. female.  Pt endorses back pain right lower back pain which started 1-2 weeks ago. Gradual onset, sharp pain, non-radiating in nature. Patient was laying down in bed when she first felt it. She went to the bathroom and when she had come back it is did not go away. 8/10 severity. Took Alleve 2 days ago which helped with the pain. Pain exacerbated by passing urine. No other excebating factors. Denies trauma or falls. 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 Denies limb weakness, fevers, chronic steroids use, urinary or bowel incontinence, saddle anesthesia, unexplained weight loss, IVDU, history of cancer, myalgiaa, numbness, paraesthesia, joint pain, gait changes. Denies dysuria, hematuria or frequency.           Past Medical History:  Diagnosis Date  . Anxiety   . Asthma   . Depression   . Diabetes mellitus ([place]) dx'd 06/19/2013  . Hernia   . High cholesterol   . Schizophrenia [facility])     Patient Active Problem List   Diagnosis Date Noted  . Small bowel obstruction ([place]) 08/17/2015  . Hyperlipidemia 08/17/2015  . SBO (small bowel obstruction) ([place])   . DKA (diabetic ketoacidoses) ([place]) 06/19/2013  . Dyslipidemia 06/19/2013  . Schizophrenia ([place]) 06/19/2013  . UTI (urinary tract infection) 06/19/2013  . Diabetes mellitus ([place]) 06/19/2013  . Family history of diabetes mellitus type I 06/19/2013    Past Surgical History:  Procedure Laterality Date  . APPENDECTOMY  08-06-15  . [place]; 1999; 2002  . FINGER FRACTURE SURGERY Left 2010  . TUBAL LIGATION  2002     OB History   No obstetric history on file.     No family history on file.  Social History   Tobacco Use  . Smoking status: Current Some Day Smoker    Years: 25.00     Types: Cigarettes  . Smokeless tobacco: Never Used  . Tobacco comment: 06/19/2013 "smoke ~ 5 cigarettes/month"  Substance Use Topics  . Alcohol use: Yes    Alcohol/week: 1.0 standard drink    Types: 1 Cans of beer per week  . Drug use: Yes    Types: "Crack" cocaine    Comment: 06/19/2013 "got off crack cocaine almost 1 yr ago"    Home Medications Prior to Admission medications   Medication Sig Start Date End Date Taking? Authorizing Provider  acetaminophen (TYLENOL) 500 MG tablet Take 500 mg by mouth every 6 (six) hours as needed for moderate pain.     [provider]  atorvastatin (LIPITOR) 20 MG tablet Take 20 mg by mouth daily.    [provider]  diclofenac Sodium (VOLTAREN) 1 % GEL Apply 2 g topically 4 (four) times daily. 11/21/19   Lattie Haw, MD  glucose monitoring kit (FREESTYLE) monitoring kit Glucometer to check blood sugars before meals and at bedtime as directed 06/22/13   Viyuoh, Adeline C, MD  ibuprofen (ADVIL) 400 MG tablet Take 1 tablet (400 mg total) by mouth every 8 (eight) hours as needed. 11/21/19   Lattie Haw, MD  Insulin Glargine (LANTUS) 100 UNIT/ML Solostar Pen Inject 18 Units into the skin daily at 10 pm. 06/22/13   Viyuoh, Adeline C, MD  insulin lispro (HUMALOG) 100 UNIT/ML KiwkPen Subcutaneous,  3 times daily with meals  Correction coverage: Moderate CBG < 70: implement low blood sugar protocol as directed CBG 70 - 120: 0 units  CBG 121 - 150: 2 units  CBG 151 - 200: 3 units  CBG 201 - 250: 5 units  CBG 251 - 300: 8 units  CBG 301 - 350: 11 units  CBG 351 - 400: 15 units  CBG > 400: call your MD/PCP as directed 06/22/13   Viyuoh, Adeline C, MD  Insulin Pen Needle (INSUPEN PEN NEEDLES) 32G X 4 MM MISC For use with Lantus and Humalog insulin Pens as directed 06/22/13   Viyuoh, Adeline C, MD  Lancets (FREESTYLE) lancets Use as instructed 06/22/13   Sheila Oats, MD  living well with diabetes book MISC 1 each by Does not apply route once. 06/22/13    Viyuoh, Alison Stalling, MD  promethazine (PHENERGAN) 25 MG tablet Take 1 tablet (25 mg total) by mouth every 6 (six) hours as needed for nausea or vomiting. 08/20/15   Rai, Ripudeep K, MD  QUEtiapine (SEROQUEL) 400 MG tablet Take 400 mg by mouth 2 (two) times daily.    [provider]  senna-docusate (SENOKOT-S) 8.6-50 MG tablet Take 2 tablets by mouth at bedtime as needed for mild constipation or moderate constipation. Also available over the counter 08/20/15   Rai, Ripudeep K, MD  traMADol (ULTRAM) 50 MG tablet Take 1 tablet (50 mg total) by mouth every 8 (eight) hours as needed for moderate pain or severe pain. 08/20/15   Rai, Vernelle Emerald, MD    Allergies    Penicillins  Review of Systems   Review of Systems  Constitutional: Negative for chills and fever.  Gastrointestinal: Negative for constipation, diarrhea, nausea and vomiting.  Genitourinary: Negative for difficulty urinating, dysuria, frequency and hematuria.  Musculoskeletal: Positive for back pain. Negative for gait problem, joint swelling and myalgias.  Neurological: Negative for weakness.    Physical Exam Updated Vital Signs BP 107/67 (BP Location: Right Arm)   Pulse 68   Temp 98.4 F (36.9 C) (Oral)   Resp 16   SpO2 100%   Physical Exam Constitutional:      General: She is awake. She is not in acute distress.    Appearance: Normal appearance. She is well-developed. She is not ill-appearing.  HENT:     Head: Normocephalic and atraumatic.  Cardiovascular:     Rate and Rhythm: Normal rate and regular rhythm.     Heart sounds: Normal heart sounds.  Pulmonary:     Effort: No tachypnea or accessory muscle usage.     Breath sounds: Normal breath sounds and air entry.  Abdominal:     General: Bowel sounds are normal. There is no distension.     Tenderness: There is no abdominal tenderness. There is no right CVA tenderness or left CVA tenderness.  Musculoskeletal:     Cervical back: Normal. No rigidity.     Thoracic  back: Normal.     Lumbar back: Normal.       Back:     Comments: No c spine tenderness  Normal flexion, extension of the spine.   Neurological:     General: No focal deficit present.     Mental Status: She is alert and oriented to person, place, and time.     GCS: GCS eye subscore is 4. GCS verbal subscore is 5. GCS motor subscore is 6.     Cranial Nerves: Cranial nerves are intact.  Motor: Motor function is intact. No weakness.     Coordination: Coordination is intact.     Deep Tendon Reflexes:     Reflex Scores:      Bicep reflexes are 2+ on the right side and 2+ on the left side.      Patellar reflexes are 2+ on the right side and 2+ on the left side.    Comments: 5/5 strength upper and lower extremities. Normal sensation throughout.  Psychiatric:        Behavior: Behavior is cooperative.     ED Results / Procedures / Treatments   Labs (all labs ordered are listed, but only abnormal results are displayed) Labs Reviewed  URINALYSIS, [place] PREG, ED    EKG None  Radiology No results found.  Procedures Procedures (including critical care time)  Medications Ordered in ED Medications - No data to display  ED Course  I have reviewed the triage vital signs and the nursing notes.  Pertinent labs & imaging results that were available during my care of the patient were reviewed by me and considered in my medical decision making (see chart for details).    MDM Rules/Calculators/A&P                         Ismerai Bin is a 49 yr old female who presents today with 2 week history of lower back pain. Alleviate with ibuprofen and worsened on movement. No red flags for back pain. Most likely diagnosis is musculoskeletal back pain. Upreg negative. UA showed **.  Signed out to Dr Laverta Baltimore at 12:50.  Discharged patient with voltaren gel and motrin. Recommended follow up with PCP, appointment is already scheduled for 15th September.   Final  Clinical Impression(s) / ED Diagnoses Final diagnoses:  Musculoskeletal back pain    Rx / DC Orders ED Discharge Orders         Ordered    diclofenac Sodium (VOLTAREN) 1 % GEL  4 times daily        11/21/19 1251    ibuprofen (ADVIL) 400 MG tablet  Every 8 hours PRN        11/21/19 1251           Lattie Haw, MD 11/21/19 1253    Margette Fast, MD 11/25/19 1225

## 2019-12-03 NOTE — Progress Notes (Deleted)
   Subjective:    Patient ID: Meghan Barrett, female    DOB: [birth year], 49 y.o.   MRN: [id number]   CC: Establish care  HPI:  Meghan Barrett is a very pleasant 49 y.o. female who presents today to establish care.  Initial concerns:***  Past medical history:***  Past surgical history:***  Current medications:***  Family history:***  Social history:***  ROS: pertinent noted in the HPI   Objective:  There were no vitals taken for this visit.  Vitals and nursing note reviewed  General: NAD, pleasant, able to participate in exam Cardiac: RRR, S1 S2 present. normal heart sounds, no murmurs. Respiratory: CTAB, normal effort, No wheezes, rales or rhonchi Abdomen: Bowel sounds present, non-tender, non-distended, no hepatosplenomegaly Extremities: no edema or cyanosis. Skin: warm and dry, no rashes noted Neuro: alert, no obvious focal deficits Psych: Normal affect and mood   Assessment & Plan:    No problem-specific Assessment & Plan notes found for this encounter.    Meghan Jumbo, DO [facility] PGY-2

## 2019-12-04 ENCOUNTER — Other Ambulatory Visit: Payer: Self-pay

## 2019-12-04 ENCOUNTER — Ambulatory Visit: Payer: Medicaid Other | Admitting: Family Medicine

## 2019-12-09 ENCOUNTER — Other Ambulatory Visit: Payer: Self-pay

## 2019-12-09 ENCOUNTER — Ambulatory Visit (INDEPENDENT_AMBULATORY_CARE_PROVIDER_SITE_OTHER): Payer: Medicaid Other | Admitting: Family Medicine

## 2019-12-09 DIAGNOSIS — J4520 Mild intermittent asthma, uncomplicated: Secondary | ICD-10-CM | POA: Diagnosis not present

## 2019-12-09 MED ORDER — CETIRIZINE HCL 10 MG PO TABS: 10.0000 mg | ORAL_TABLET | Freq: Every day | ORAL | 11 refills | Status: DC

## 2019-12-09 MED ORDER — FLUTICASONE PROPIONATE 50 MCG/ACT NA SUSP: 1.0000 | Freq: Every day | NASAL | 12 refills | Status: DC

## 2019-12-09 NOTE — Progress Notes (Signed)
    SUBJECTIVE:   CHIEF COMPLAINT / HPI:   Patient presenting for new patient visit in CIDD clinic. She was scheduled for this due to recent cough thought to be due to   Asthma Has had asthma since she was born. Has not had SOB or DOE.Took pro-air yesterday for wheezing. Patient reports allergy symptoms of congestion as well which she thinks is due to change in weather.  Last saw her doctor in [place] about a month ago. She goes back and forth to [place] for care. We do not have any of her records here or on CareEverywhere. She uses Atrovent daily. She reports using her albuterol inhaler maybe two times a week on "bad weeks" but not at all sometimes.   PERTINENT  PMH / PSH: Current smoker  OBJECTIVE:   BP 96/60   Pulse 81   Ht 5\' 6"  (1.676 m)   LMP 11/21/2019 (Exact Date)   SpO2 97%   BMI 21.60 kg/m   General: well appearing female, NAD  Lungs: CTAB, no wheezing or rales appreciated.   ASSESSMENT/PLAN:   Mild intermittent asthma Will add flonase and zyrtec to patient's med regimen for recent seasonal allergy symptoms in the setting of her well controlled asthma.  No wheezing or respiratory distress on exam today.  Patient originally came in for a new patient appointment which was not able to be done due to her being in CIDD clinic.  Patient scheduled appointment for new patient visit with Dr. 01/21/2020.     Salvadore Dom, MD [facility]

## 2019-12-09 NOTE — Patient Instructions (Addendum)
We are going to start Flonase nasal spray and zyrtec pills. Please take these once a day and continue your Atrovent. If you need to take your red (or blue inhaler) more than twice in one week, please come back.   Please ask your previous provider to release your records from [place] or obtain this information for your new patient appointment.

## 2019-12-13 ENCOUNTER — Ambulatory Visit: Payer: Medicaid Other | Admitting: Family Medicine

## 2019-12-13 DIAGNOSIS — J4520 Mild intermittent asthma, uncomplicated: Secondary | ICD-10-CM | POA: Insufficient documentation

## 2019-12-13 DIAGNOSIS — J4540 Moderate persistent asthma, uncomplicated: Secondary | ICD-10-CM | POA: Insufficient documentation

## 2019-12-13 NOTE — Assessment & Plan Note (Addendum)
Will add flonase and zyrtec to patient's med regimen for recent seasonal allergy symptoms in the setting of her well controlled asthma.  No wheezing or respiratory distress on exam today.  Patient originally came in for a new patient appointment which was not able to be done due to her being in CIDD clinic.  Patient scheduled appointment for new patient visit with Dr. Salvadore Dom.

## 2019-12-24 ENCOUNTER — Encounter: Payer: Self-pay | Admitting: Family Medicine

## 2019-12-24 ENCOUNTER — Ambulatory Visit (INDEPENDENT_AMBULATORY_CARE_PROVIDER_SITE_OTHER): Payer: Medicaid Other | Admitting: Family Medicine

## 2019-12-24 ENCOUNTER — Other Ambulatory Visit: Payer: Self-pay

## 2019-12-24 VITALS — BP 102/62 | HR 61 | Ht 66.0 in | Wt 135.4 lb

## 2019-12-24 DIAGNOSIS — Z0000 Encounter for general adult medical examination without abnormal findings: Secondary | ICD-10-CM | POA: Diagnosis not present

## 2019-12-24 DIAGNOSIS — K219 Gastro-esophageal reflux disease without esophagitis: Secondary | ICD-10-CM

## 2019-12-24 DIAGNOSIS — E785 Hyperlipidemia, unspecified: Secondary | ICD-10-CM | POA: Diagnosis not present

## 2019-12-24 DIAGNOSIS — E119 Type 2 diabetes mellitus without complications: Secondary | ICD-10-CM | POA: Diagnosis not present

## 2019-12-24 DIAGNOSIS — Z8639 Personal history of other endocrine, nutritional and metabolic disease: Secondary | ICD-10-CM

## 2019-12-24 LAB — POCT GLYCOSYLATED HEMOGLOBIN (HGB A1C): HbA1c, POC (controlled diabetic range): 5.4 % (ref 0.0–7.0)

## 2019-12-24 MED ORDER — OMEPRAZOLE 20 MG PO CPDR: 20.0000 mg | DELAYED_RELEASE_CAPSULE | Freq: Every day | ORAL | 3 refills | Status: DC

## 2019-12-24 NOTE — Progress Notes (Signed)
   Subjective:    Patient ID: Meghan Barrett, female    DOB: 05-10-1970, 49 y.o.   MRN: [id number]   CC: Establish care  HPI:  Meghan Barrett is a very pleasant 49 y.o. female who presents today to establish care.  Initial concerns: burning chest has problems with gas and takes gas pills which help. Would like lab work   Past medical history:  Asthma (no current therapy it is well controlled; seen 12/13/19 by Dr. Selena Batten and given zyrtec and flonase. Not taking flonase at this time.  T2DM (was on insulin is not taking any medication currently, hx of DKA)  HLD (lipitor 20 mg daily on med list but patient is not taking) Schizophrenia (Connected w/ [facility] previously taking seroquel) Hx of thyroid disease (no medications) Hx of SBO  Past surgical history: appendix removed 2-3 years ago   Current medications: ASA 81 mg, vitamin d, zyrtec  Family history: Review tab  Social history: 6 months ago from [place] lived in area prior to moving to [place]. Not working currently. Denies alcohol use, or recreational drug use.   ROS: pertinent noted in the HPI   Objective:  BP 102/62   Pulse 61   Ht 5\' 6"  (1.676 m)   Wt 135 lb 6.4 oz (61.4 kg)   SpO2 99%   BMI 21.85 kg/m   Vitals and nursing note reviewed  General: NAD, pleasant, able to participate in exam Cardiac: RRR, S1 S2 present. normal heart sounds, no murmurs. Respiratory: CTAB, normal effort, No wheezes, rales or rhonchi Abdomen: Bowel sounds present, non-tender, non-distended, no hepatosplenomegaly Extremities: no edema or cyanosis. Skin: warm and dry, no rashes noted Neuro: alert, no obvious focal deficits Psych: Normal affect and mood  Assessment & Plan:    Encounter for medical examination to establish care -F/u in 1-2 weeks to address other HM or care gaps  GERD -Omeprazole 20mg  daily   Hx of T2DM -A1c -BMP  HLD -Lipid panel  Hx of thyroid disease -TSH  Schizophrenia -Psychology appt 12/30/19 -Psychiatry appt  01/16/20  02/29/20, DO [facility] PGY-2

## 2019-12-24 NOTE — Patient Instructions (Signed)
It was wonderful to see you today.  Please bring ALL of your medications with you to every visit.   Today you were seen for new patient visit.  We discussed acid reflux we will try omeprazole 20 mg daily to see if this improves.  We also got blood work today.  If it is abnormal I will give you a call if it is normal I will send a letter.  Please be sure to schedule follow up in 1 to 2 weeks to address other issues at the front  desk before you leave today.   Please call the clinic at [phone] if your symptoms worsen or you have any concerns. It was our pleasure to serve you.  Dr. Salvadore Dom

## 2019-12-25 ENCOUNTER — Encounter (INDEPENDENT_AMBULATORY_CARE_PROVIDER_SITE_OTHER): Payer: Self-pay | Admitting: Family Medicine

## 2019-12-25 LAB — BASIC METABOLIC PANEL
BUN/Creatinine Ratio: 13 (ref 9–23)
BUN: 13 mg/dL (ref 6–24)
CO2: 26 mmol/L (ref 20–29)
Calcium: 9.5 mg/dL (ref 8.7–10.2)
Chloride: 102 mmol/L (ref 96–106)
Creatinine, Ser: 1 mg/dL (ref 0.57–1.00)
GFR calc Af Amer: 78 mL/min/{1.73_m2} (ref >59)
GFR calc non Af Amer: 68 mL/min/{1.73_m2} (ref >59)
Glucose: 75 mg/dL (ref 65–99)
Potassium: 4.5 mmol/L (ref 3.5–5.2)
Sodium: 139 mmol/L (ref 134–144)

## 2019-12-25 LAB — TSH: TSH: 0.805 u[IU]/mL (ref 0.450–4.500)

## 2019-12-25 LAB — LIPID PANEL
Chol/HDL Ratio: 2.8 ratio (ref 0.0–4.4)
Cholesterol, Total: 204 mg/dL — ABNORMAL HIGH (ref 100–199)
HDL: 72 mg/dL (ref >39)
LDL Chol Calc (NIH): 112 mg/dL — ABNORMAL HIGH (ref 0–99)
Triglycerides: 117 mg/dL (ref 0–149)
VLDL Cholesterol Cal: 20 mg/dL (ref 5–40)

## 2019-12-28 ENCOUNTER — Encounter: Payer: Self-pay | Admitting: Family Medicine

## 2019-12-28 DIAGNOSIS — Z0000 Encounter for general adult medical examination without abnormal findings: Secondary | ICD-10-CM | POA: Insufficient documentation

## 2019-12-28 NOTE — Assessment & Plan Note (Addendum)
-  F/u in 1-2 weeks to address other HM or care gaps  GERD -Omeprazole 20mg  daily   Hx of T2DM -A1c -BMP  HLD -Lipid panel  Hx of thyroid disease -TSH  Schizophrenia -Psychology appt 12/30/19 -Psychiatry appt 01/16/20

## 2020-01-02 ENCOUNTER — Telehealth: Payer: Self-pay

## 2020-01-02 ENCOUNTER — Ambulatory Visit: Payer: Medicaid Other | Admitting: Family Medicine

## 2020-01-02 NOTE — Telephone Encounter (Signed)
Patient calls nurse line requesting test results. Informed of negative results and that letter was sent via mail to home address.   FYI to PCP  Veronda Prude, RN

## 2020-01-09 ENCOUNTER — Encounter: Payer: Medicaid Other | Admitting: Family Medicine

## 2020-01-27 ENCOUNTER — Ambulatory Visit (INDEPENDENT_AMBULATORY_CARE_PROVIDER_SITE_OTHER): Payer: Medicaid Other | Admitting: Family Medicine

## 2020-01-27 ENCOUNTER — Other Ambulatory Visit: Payer: Self-pay

## 2020-01-27 ENCOUNTER — Ambulatory Visit (HOSPITAL_COMMUNITY): Admission: RE | Admit: 2020-01-27 | Payer: Medicaid Other | Source: Ambulatory Visit

## 2020-01-27 ENCOUNTER — Ambulatory Visit (HOSPITAL_COMMUNITY)
Admission: RE | Admit: 2020-01-27 | Discharge: 2020-01-27 | Disposition: A | Discharge status: Home / Self Care | Payer: Medicaid Other | Source: Ambulatory Visit | Attending: Family Medicine | Admitting: Family Medicine

## 2020-01-27 ENCOUNTER — Other Ambulatory Visit (HOSPITAL_COMMUNITY)
Admission: RE | Admit: 2020-01-27 | Discharge: 2020-01-27 | Disposition: A | Discharge status: Home / Self Care | Payer: Medicaid Other | Source: Ambulatory Visit | Attending: Family Medicine | Admitting: Family Medicine

## 2020-01-27 ENCOUNTER — Encounter: Payer: Self-pay | Admitting: Family Medicine

## 2020-01-27 VITALS — BP 110/62 | HR 83 | Ht 66.0 in | Wt 140.0 lb

## 2020-01-27 DIAGNOSIS — Z1231 Encounter for screening mammogram for malignant neoplasm of breast: Secondary | ICD-10-CM | POA: Diagnosis not present

## 2020-01-27 DIAGNOSIS — R079 Chest pain, unspecified: Secondary | ICD-10-CM | POA: Diagnosis present

## 2020-01-27 DIAGNOSIS — E785 Hyperlipidemia, unspecified: Secondary | ICD-10-CM

## 2020-01-27 DIAGNOSIS — K219 Gastro-esophageal reflux disease without esophagitis: Secondary | ICD-10-CM | POA: Diagnosis not present

## 2020-01-27 DIAGNOSIS — Z1211 Encounter for screening for malignant neoplasm of colon: Secondary | ICD-10-CM

## 2020-01-27 DIAGNOSIS — Z8639 Personal history of other endocrine, nutritional and metabolic disease: Secondary | ICD-10-CM | POA: Diagnosis not present

## 2020-01-27 DIAGNOSIS — Z113 Encounter for screening for infections with a predominantly sexual mode of transmission: Secondary | ICD-10-CM | POA: Diagnosis not present

## 2020-01-27 DIAGNOSIS — Z124 Encounter for screening for malignant neoplasm of cervix: Secondary | ICD-10-CM | POA: Diagnosis not present

## 2020-01-27 DIAGNOSIS — Z23 Encounter for immunization: Secondary | ICD-10-CM

## 2020-01-27 DIAGNOSIS — Z0000 Encounter for general adult medical examination without abnormal findings: Secondary | ICD-10-CM

## 2020-01-27 DIAGNOSIS — Z1212 Encounter for screening for malignant neoplasm of rectum: Secondary | ICD-10-CM

## 2020-01-27 HISTORY — DX: Personal history of other endocrine, nutritional and metabolic disease: Z86.39

## 2020-01-27 LAB — POCT WET PREP (WET MOUNT)
Clue Cells Wet Prep Whiff POC: NEGATIVE
Trichomonas Wet Prep HPF POC: ABSENT

## 2020-01-27 MED ORDER — OMEPRAZOLE 20 MG PO CPDR: 40.0000 mg | DELAYED_RELEASE_CAPSULE | Freq: Every day | ORAL | 3 refills | Status: DC

## 2020-01-27 NOTE — Assessment & Plan Note (Addendum)
Chronic for several months (since she moved here 7-8 months ago). Worried about MI due to family hx, suspicion for this is low with chronic symptoms, ASCVD risk of 0.3%, intermittent symptoms. EKG wnl and no suggestion of prior MI. If continues will need to evaluate further but likely due to continued uncontrolled acid reflux as symptoms are worse at night and a component of burning sensation. -Increase omeprazole as below -Follow-up in 1 week if continues -Call or go to ED if you develop shortness of breath continuing chest pain and dizziness.

## 2020-01-27 NOTE — Assessment & Plan Note (Signed)
Component of chest pain may be due to uncontrolled reflux. -Increase omeprazole to 40 mg daily

## 2020-01-27 NOTE — Progress Notes (Signed)
SUBJECTIVE:   Chief compliant/HPI: annual examination  Meghan Barrett is a 49 y.o. who presents today for an annual exam.   History tabs reviewed and updated.   Review of systems form reviewed and notable for chest pain.   Chest pain  Chronic history of burning chest pain as well as gas pains.  Patient is starting to feel as if this is different.  She has a pain on the left side of her chest that is intermittent and lasts for 1 to 2 minutes resolving on its own.  Is also relieved by drinking hot water.  She has a concern for heart attack as her father passed away due to heart attack and mother had heart disease.  Otherwise denies shortness of breath and dizziness.  Pap Smear Last Pap smear unknown.  Prior paps: unknown Patient's last menstrual period was 01/13/2020. Contraception: s/p BTL 2002  Sexually Active: No, recently 7-8 months prior Desire for STD Screening: Yes  Last mammogram: 7 years prior, normal, grandmother wit hx of breast cancer  OBJECTIVE:   BP 110/62   Pulse 83   Ht _0  (1.676 m)   Wt 140 lb (63.5 kg)   LMP 01/13/2020   SpO2 97%   BMI 22.60 kg/m   General: Appears well, no acute distress. Age appropriate. Cardiac: RRR, normal heart sounds, no murmurs Respiratory: CTAB, normal effort Abdomen: soft, nontender, nondistended Extremities: No edema or cyanosis. Skin: Warm and dry, no rashes noted Neuro: alert and oriented, no focal deficits Psych: normal affect Results for orders placed or performed in visit on 01/27/20 (from the past 24 hour(s))  POCT Wet Prep [facility] [place])     Status: None   Collection Time: 01/27/20  2:20 PM  Result Value Ref Range   Source Wet Prep POC VAG    WBC, Wet Prep HPF POC NONE    Bacteria Wet Prep HPF POC Few Few   Clue Cells Wet Prep HPF POC None None   Clue Cells Wet Prep Whiff POC Negative Whiff    Yeast Wet Prep HPF POC None None   KOH Wet Prep POC None None   Trichomonas Wet Prep HPF POC Absent Absent    EKG Normal sinus rhythm Normal ECG No old tracing to compare  Vent. rate 66 BPM PR interval 142 ms QRS duration 90 ms QT/QTc 402/421 ms P-R-T axes 74 64 61  Confirmed by Charolette Forward ([id number]) on 01/27/2020 4:29:10 PM  ASSESSMENT/PLAN:   Chest pain Chronic for several months (since she moved here 7-8 months ago). Worried about MI due to family hx, suspicion for this is low with chronic symptoms, ASCVD risk of 0.3%, intermittent symptoms. EKG wnl and no suggestion of prior MI. If continues will need to evaluate further but likely due to continued uncontrolled acid reflux as symptoms are worse at night and a component of burning sensation. -Increase omeprazole as below -Follow-up in 1 week if continues -Call or go to ED if you develop shortness of breath continuing chest pain and dizziness.  Gastroesophageal reflux disease Component of chest pain may be due to uncontrolled reflux. -Increase omeprazole to 40 mg daily  Need for Tdap vaccination - Tdap vaccine greater than or equal to 7yo IM  Annual Examination  See AVS for age appropriate recommendations.   PHQ score 0, reviewed and discussed.  Blood pressure reviewed and at goal.  Asked about intimate partner violence and resources given as appropriate  The patient has history of BTL  for contraception.   Considered the following items based upon USPSTF recommendations: Diabetes screening: discussed Screening for elevated cholesterol: discussed HIV testing: ordered Hepatitis C: ordered Hepatitis B: ordered Syphilis if at high risk: ordered GC/CT ordered for patient preference Reviewed risk factors for latent tuberculosis and not indicated Reviewed risk factors for osteoporosis. Using FRAX tool estimated risk of major osteoporotic fracture of  2.7%, early screening not ordered   Discussed family history, BRCA testing not indicated. Tool used to risk stratify was bcrisktool.  Cervical cancer screening: due for Pap today,  cytology + HPV ordered Breast cancer screening: discussed potential benefits, and USPSTF guidelines, elected proceed with mammogram Colorectal cancer screening: discussed, colonoscopy ordered if age 62 or over.   Follow up in 1 year or sooner if indicated.   Gerlene Fee, [place]

## 2020-01-27 NOTE — Patient Instructions (Addendum)
It was wonderful to see you today.  Please bring ALL of your medications with you to every visit.   Today we talked about:  Chest pain and you were worried about a heart attack. Your EKG looked normal today. Please continue to take your omeprazole and do not eat close to bedtime and see if this improves what we think are likely gas pains. I have increased your omeprazole to 40 mg daily.   You received a physical today. For the blood work if abnormal I will call you. If normal I will send you a letter stating this.   Follow up if you continue to have chest pain in 1-2 weeks.   Please call the clinic at [phone] if your symptoms worsen or you have any concerns. It was our pleasure to serve you.  Dr. Salvadore Dom

## 2020-01-28 LAB — CYTOLOGY - PAP
Adequacy: ABSENT
Comment: NEGATIVE
Diagnosis: NEGATIVE
High risk HPV: NEGATIVE

## 2020-01-28 LAB — HCV INTERPRETATION

## 2020-01-28 LAB — RPR: RPR Ser Ql: NONREACTIVE

## 2020-01-28 LAB — HCV AB W REFLEX TO QUANT PCR: HCV Ab: <0.1 s/co ratio (ref 0.0–0.9)

## 2020-01-28 LAB — HEPATITIS B SURFACE ANTIGEN: Hepatitis B Surface Ag: NEGATIVE

## 2020-01-28 LAB — HIV ANTIBODY (ROUTINE TESTING W REFLEX): HIV Screen 4th Generation wRfx: NONREACTIVE

## 2020-01-29 ENCOUNTER — Encounter: Payer: Self-pay | Admitting: Family Medicine

## 2020-03-03 ENCOUNTER — Ambulatory Visit: Payer: Self-pay

## 2020-03-03 NOTE — Telephone Encounter (Signed)
Pt called for advice about receiving visitors at home. Advised pt that if the visitor is having any covid sx, advised not to have visitor at this time. Advised pt for both pt and visitor to wear masks, stay at least 6 feet apart, and to wash her hands after visit.  Pt verbalized understanding.  Reason for Disposition . General information question, no triage required and triager able to answer question  Answer Assessment - Initial Assessment Questions 1. REASON FOR CALL or QUESTION: "What is your reason for calling today?" or "How can I best help you?" or "What question do you have that I can help answer?"     Can she have a visitor during covid pandemic.  Protocols used: INFORMATION ONLY CALL - NO TRIAGE-A-AH

## 2020-03-16 ENCOUNTER — Ambulatory Visit: Payer: Medicaid Other | Admitting: Family Medicine

## 2020-03-16 ENCOUNTER — Ambulatory Visit: Payer: Medicaid Other

## 2020-03-18 ENCOUNTER — Other Ambulatory Visit: Payer: Self-pay

## 2020-03-18 ENCOUNTER — Ambulatory Visit (INDEPENDENT_AMBULATORY_CARE_PROVIDER_SITE_OTHER): Payer: Medicaid Other | Admitting: Family Medicine

## 2020-03-18 ENCOUNTER — Encounter: Payer: Self-pay | Admitting: Family Medicine

## 2020-03-18 VITALS — BP 102/64 | HR 83 | Ht 66.0 in | Wt 147.2 lb

## 2020-03-18 DIAGNOSIS — Z8639 Personal history of other endocrine, nutritional and metabolic disease: Secondary | ICD-10-CM | POA: Diagnosis not present

## 2020-03-18 DIAGNOSIS — J4520 Mild intermittent asthma, uncomplicated: Secondary | ICD-10-CM

## 2020-03-18 DIAGNOSIS — Z23 Encounter for immunization: Secondary | ICD-10-CM | POA: Diagnosis not present

## 2020-03-18 NOTE — Progress Notes (Addendum)
    SUBJECTIVE:   CHIEF COMPLAINT / HPI:   Ms. Meghan Barrett is a 49 yo F who presents for the following.   COVID vaccine Has anxiety surrounding COVID vaccine but wanted to discuss with provider before administration. Siblings has received vaccine and they are doing well. Patient has a fear of dying because of the vaccine. Would like moderna because that is what her siblings have. No other concerns today.   PERTINENT  PMH / PSH: Asthma, schizophrenia, hx of DM  OBJECTIVE:   BP 102/64   Pulse 83   Ht 5\' 6"  (1.676 m)   Wt 147 lb 3.2 oz (66.8 kg)   SpO2 99%   BMI 23.76 kg/m   PHQ9 SCORE ONLY 03/18/2020 01/27/2020 12/27/2019  PHQ-9 Total Score 0 0 0   General: Appears well, no acute distress. Age appropriate. Cardiac: RRR, normal heart sounds, no murmurs Respiratory: CTAB, normal effort Extremities: No edema or cyanosis. Skin: Warm and dry, no rashes noted Neuro: alert and oriented Psych: normal affect  ASSESSMENT/PLAN:   Need for COVID-19 vaccine Discussed benefit of getting vaccine outweighing the risk and limited data connected to deaths due to vaccines but continue death claimed due to COVID infections. Initially wanted to get moderna but explained we only carry pfizer and she could call pharmacy to ask if they are carrying moderna. Allowed patient time to speak with family over the phone about receiving vaccine. She would like to receive the pfizer shot today. She was quarantined 15 minutes following vaccine and rest of office visit was unremarkable. Patient to follow when appropriate for 2nd vaccine.  - Pfizer SARS-COV-2 Vaccine  Mild intermittent asthma Well controlled.   Hx of diabetes mellitus Last Hgb A1c 5.4 12/24/2019   02/23/2020, DO [place] [facility]

## 2020-03-18 NOTE — Patient Instructions (Addendum)
It was wonderful to see you today.  Congratulations on getting your first COVID vaccine! Follow up in 21 days for the 2nd vaccine.  Remember to call  [phone] to schedule your colonoscopy.   Please call the clinic at [phone] if you have any concerns. It was our pleasure to serve you.  Dr. Salvadore Dom

## 2020-03-23 NOTE — Assessment & Plan Note (Signed)
Last Hgb A1c 5.4 12/24/2019

## 2020-03-23 NOTE — Assessment & Plan Note (Signed)
Well controlled 

## 2020-03-27 ENCOUNTER — Encounter (HOSPITAL_COMMUNITY): Payer: Self-pay

## 2020-03-27 ENCOUNTER — Other Ambulatory Visit: Payer: Self-pay

## 2020-03-27 ENCOUNTER — Ambulatory Visit (HOSPITAL_COMMUNITY)
Admission: EM | Admit: 2020-03-27 | Discharge: 2020-03-27 | Disposition: A | Discharge status: Home / Self Care | Payer: Medicaid Other | Attending: Family Medicine | Admitting: Family Medicine

## 2020-03-27 DIAGNOSIS — B349 Viral infection, unspecified: Secondary | ICD-10-CM | POA: Diagnosis not present

## 2020-03-27 DIAGNOSIS — U071 COVID-19: Secondary | ICD-10-CM | POA: Insufficient documentation

## 2020-03-27 MED ORDER — PREDNISONE 20 MG PO TABS: 40.0000 mg | ORAL_TABLET | Freq: Every day | ORAL | 0 refills | Status: DC

## 2020-03-27 NOTE — Discharge Instructions (Addendum)
Your COVID 19 results will be available in 24-48 hours. Negative results are immediately resulted to Mychart. Positive results will receive a follow-up call from our clinic. If symptoms are present, I recommend home quarantine until results are known.  ?

## 2020-03-27 NOTE — ED Provider Notes (Signed)
[facility]    CSN: [id number] Arrival date & time: 03/27/20  1214      History   Chief Complaint Chief Complaint  Patient presents with  . Cough  . Nasal Congestion  . Headache    HPI Meghan Barrett is a 50 y.o. female.   HPI  Patient in today for COVID-19 testing. Patient is endorses 2 days of coughing, nasal congestion scratchy throat with postnasal drainage. She denies any known exposure to anyone positive with COVID. She denies feeling feverish. She endorses some generalized body aches. She has a nebulizer machine at home however has been unable to use it for shortness of breath and wheezing due to not having tubing and she is requesting tubing supplied today. She has been taking cetirizine and Robitussin which has helped with her symptoms.  Past Medical History:  Diagnosis Date  . Anxiety   . Asthma   . Depression   . Diabetes mellitus (HCC) dx'd 06/19/2013  . Hernia   . High cholesterol   . Hx of diabetes mellitus 01/27/2020  . Schizophrenia [facility])     Patient Active Problem List   Diagnosis Date Noted  . Hx of diabetes mellitus 01/27/2020  . Gastroesophageal reflux disease 01/27/2020  . Mild intermittent asthma 12/13/2019  . Hyperlipidemia 08/17/2015  . Schizophrenia (HCC) 06/19/2013    Past Surgical History:  Procedure Laterality Date  . APPENDECTOMY  08-06-15  . CESAREAN SECTION  1991; 1999; 2002  . FINGER FRACTURE SURGERY Left 2010  . TUBAL LIGATION  2002    OB History   No obstetric history on file.      Home Medications    Prior to Admission medications   Medication Sig Start Date End Date Taking? Authorizing Provider  fluticasone (FLONASE) 50 MCG/ACT nasal spray  03/22/20  Yes [provider]  cetirizine (ZYRTEC) 10 MG tablet Take 1 tablet (10 mg total) by mouth daily. 12/09/19   Melene Plan, MD  FLUoxetine (PROZAC) 20 MG capsule Take 20 mg by mouth daily. 12/02/19   [provider]  omeprazole (PRILOSEC) 20 MG capsule  Take 2 capsules (40 mg total) by mouth daily. 01/27/20   Autry-Lott, Randa Evens, DO  risperiDONE (RISPERDAL) 0.5 MG tablet Take 0.5 mg by mouth at bedtime. 12/02/19   [provider]    Family History Family History  Problem Relation Age of Onset  . Alcohol abuse Mother   . Drug abuse Mother   . Diabetes Mother   . Alcohol abuse Father   . Drug abuse Father   . Heart attack Father   . Diabetes Father   . Diabetes Sister   . Early death Sister   . High Cholesterol Sister   . Cancer Other   . Stroke Other     Social History Social History   Tobacco Use  . Smoking status: Current Some Day Smoker    Years: 25.00    Types: Cigarettes  . Smokeless tobacco: Never Used  . Tobacco comment: 06/19/2013 "smoke ~ 5 cigarettes/month"  Substance Use Topics  . Alcohol use: Yes    Alcohol/week: 1.0 standard drink    Types: 1 Cans of beer per week  . Drug use: Yes    Types: "Crack" cocaine    Comment: 06/19/2013 "got off crack cocaine almost 1 yr ago"     Allergies   Penicillins   Review of Systems Review of Systems Pertinent negatives listed in HPI   Physical Exam Triage Vital Signs ED Triage  Vitals  Enc Vitals Group     BP 03/27/20 1504 115/66     Pulse Rate 03/27/20 1504 83     Resp 03/27/20 1504 19     Temp 03/27/20 1504 98.5 F (36.9 C)     Temp src --      SpO2 03/27/20 1504 96 %     Weight --      Height --      Head Circumference --      Peak Flow --      Pain Score 03/27/20 1500 0     Pain Loc --      Pain Edu? --      Excl. in GC? --    No data found.  Updated Vital Signs BP 115/66   Pulse 83   Temp 98.5 F (36.9 C)   Resp 19   LMP 03/04/2020 (Approximate)   SpO2 96%   Visual Acuity Right Eye Distance:   Left Eye Distance:   Bilateral Distance:    Right Eye Near:   Left Eye Near:    Bilateral Near:     Physical Exam General appearance: alert, Ill-appearing, no distress Head: Normocephalic, without obvious abnormality, atraumatic ENT:  Mucosal edema, congestion,  oropharynx w/o exudate Respiratory: Respirations even , unlabored, coarse lung sound, expiratory wheeze Heart: rate and rhythm normal. No gallop or murmurs noted on exam  Abdomen: BS +, no distention, no rebound tenderness, or no mass Extremities: No gross deformities Skin: Skin color, texture, turgor normal. No rashes seen  Psych: Appropriate mood and affect. UC Treatments / Results  Labs (all labs ordered are listed, but only abnormal results are displayed) Labs Reviewed - No data to display  EKG   Radiology No results found.  Procedures Procedures (including critical care time)  Medications Ordered in UC Medications - No data to display  Initial Impression / Assessment and Plan / UC Course  I have reviewed the triage vital signs and the nursing notes.  Pertinent labs & imaging results that were available during my care of the patient were reviewed by me and considered in my medical decision making (see chart for details).    COVID test pending. Symptom management warranted only.  Manage fever with Motrin and ibuprofen.  Nasal symptoms with over-the-counter antihistamines recommended.  Treatment per discharge medications/discharge instructions. Tubing provided for nebulizer machine advised to use solution every 6 hours as needed for neb treatments. Red flags/ER precautions given. The most current CDC isolation/quarantine recommendation advised.  Final Clinical Impressions(s) / UC Diagnoses   Final diagnoses:  Viral illness     Discharge Instructions     Your COVID 19 results will be available in 24-48 hours. Negative results are immediately resulted to Mychart. Positive results will receive a follow-up call from our clinic. If symptoms are present, I recommend home quarantine until results are known.     ED Prescriptions    Medication Sig Dispense Auth. Provider   predniSONE (DELTASONE) 20 MG tablet Take 2 tablets (40 mg total) by mouth  daily with breakfast. 10 tablet Bing Neighbors, FNP     PDMP not reviewed this encounter.   Bing Neighbors, FNP 03/27/20 [phone]

## 2020-03-27 NOTE — ED Triage Notes (Signed)
Pt in with c/o cough and congestion that has been going on for 3 days now. States that she thinks it's her allergies and asthma because she has dust flying around in her apartment  Pt states she has been taking cough medicine with no relief

## 2020-03-28 LAB — SARS CORONAVIRUS 2 (TAT 6-24 HRS): SARS Coronavirus 2: POSITIVE — AB

## 2020-04-01 ENCOUNTER — Telehealth: Payer: Self-pay

## 2020-04-01 NOTE — Telephone Encounter (Signed)
Patient calls nurse line regarding testing positive for COVID on 03/27/20. Urgent care provided work letter for first five days, however, patient continues to be symptomatic with cough and fever. Patient is requesting a work excuse note for an additional five days.   Patient reports that she works at [employer]   Spoke with Dr. Leveda Anna (preceptor) regarding patient. Will provide note with return to work of 04/06/20.    Patient will call front office when she arrives and letter will be taken to her in her vehicle.   Meghan Prude, RN

## 2020-04-07 ENCOUNTER — Ambulatory Visit (HOSPITAL_COMMUNITY)
Admission: EM | Admit: 2020-04-07 | Discharge: 2020-04-07 | Disposition: A | Discharge status: Home / Self Care | Payer: Medicaid Other | Attending: Family Medicine | Admitting: Family Medicine

## 2020-04-07 DIAGNOSIS — U071 COVID-19: Secondary | ICD-10-CM

## 2020-04-07 MED ORDER — LIDOCAINE VISCOUS HCL 2 % MT SOLN: 10.0000 mL | OROMUCOSAL | 0 refills | Status: DC | PRN

## 2020-04-07 NOTE — ED Triage Notes (Signed)
Pt is here for sore throat onset yesterday and still having persistent cough  sts she tested positive for COVID on 03/27/2020 but now is needing a neg result to go back to work.    A&O x4... NAD.Marland Kitchen ambulatory

## 2020-04-07 NOTE — ED Provider Notes (Signed)
[place]    CSN: [id number] Arrival date & time: 04/07/20  1120      History   Chief Complaint Chief Complaint  Patient presents with  . URI    HPI Meghan Barrett is a 50 y.o. female.   Here today requesting clearance back to work post-COVID. Diagnosed 03/27/20 with COVID, having lingering sore throat but otherwise feeling very well and back to baseline health. Denies fever, chills, CP, SOB, cough, abdominal pain. Not currently taking anything OTC for sxs.      Past Medical History:  Diagnosis Date  . Anxiety   . Asthma   . Depression   . Diabetes mellitus ([place]) dx'd 06/19/2013  . Hernia   . High cholesterol   . Hx of diabetes mellitus 01/27/2020  . Schizophrenia [facility])     Patient Active Problem List   Diagnosis Date Noted  . Hx of diabetes mellitus 01/27/2020  . Gastroesophageal reflux disease 01/27/2020  . Mild intermittent asthma 12/13/2019  . Hyperlipidemia 08/17/2015  . Schizophrenia ([place]) 06/19/2013    Past Surgical History:  Procedure Laterality Date  . APPENDECTOMY  08-06-15  . [place]; 1999; 2002  . FINGER FRACTURE SURGERY Left 2010  . TUBAL LIGATION  2002    OB History   No obstetric history on file.      Home Medications    Prior to Admission medications   Medication Sig Start Date End Date Taking? Authorizing Provider  lidocaine (XYLOCAINE) 2 % solution Use as directed 10 mLs in the mouth or throat as needed for mouth pain. 04/07/20  Yes Volney American, PA-C  cetirizine (ZYRTEC) 10 MG tablet Take 1 tablet (10 mg total) by mouth daily. 12/09/19   Wilber Oliphant, MD  FLUoxetine (PROZAC) 20 MG capsule Take 20 mg by mouth daily. 12/02/19   [provider]  fluticasone Asencion Islam) 50 MCG/ACT nasal spray  03/22/20   [provider]  omeprazole (PRILOSEC) 20 MG capsule Take 2 capsules (40 mg total) by mouth daily. 01/27/20   Autry-Lott, Naaman Plummer, DO  predniSONE (DELTASONE) 20 MG tablet Take 2 tablets (40 mg  total) by mouth daily with breakfast. 03/27/20   Scot Jun, FNP  risperiDONE (RISPERDAL) 0.5 MG tablet Take 0.5 mg by mouth at bedtime. 12/02/19   [provider]    Family History Family History  Problem Relation Age of Onset  . Alcohol abuse Mother   . Drug abuse Mother   . Diabetes Mother   . Alcohol abuse Father   . Drug abuse Father   . Heart attack Father   . Diabetes Father   . Diabetes Sister   . Early death Sister   . High Cholesterol Sister   . Cancer Other   . Stroke Other     Social History Social History   Tobacco Use  . Smoking status: Current Some Day Smoker    Years: 25.00    Types: Cigarettes  . Smokeless tobacco: Never Used  . Tobacco comment: 06/19/2013 "smoke ~ 5 cigarettes/month"  Substance Use Topics  . Alcohol use: Yes    Alcohol/week: 1.0 standard drink    Types: 1 Cans of beer per week  . Drug use: Yes    Types: "Crack" cocaine    Comment: 06/19/2013 "got off crack cocaine almost 1 yr ago"     Allergies   Penicillins   Review of Systems Review of Systems PER HPI    Physical Exam Triage Vital Signs ED  Triage Vitals  Enc Vitals Group     BP 04/07/20 1207 (!) 112/58     Pulse Rate 04/07/20 1207 (!) 59     Resp 04/07/20 1207 16     Temp 04/07/20 1207 98.1 F (36.7 C)     Temp Source 04/07/20 1207 Oral     SpO2 04/07/20 1207 100 %     Weight --      Height --      Head Circumference --      Peak Flow --      Pain Score 04/07/20 1206 8     Pain Loc --      Pain Edu? --      Excl. in GC? --    No data found.  Updated Vital Signs BP (!) 112/58 (BP Location: Right Arm)   Pulse (!) 59   Temp 98.1 F (36.7 C) (Oral)   Resp 16   LMP 04/04/2020   SpO2 100%   Visual Acuity Right Eye Distance:   Left Eye Distance:   Bilateral Distance:    Right Eye Near:   Left Eye Near:    Bilateral Near:     Physical Exam Vitals and nursing note reviewed.  Constitutional:      Appearance: Normal appearance. She is not  ill-appearing.  HENT:     Head: Atraumatic.     Right Ear: Tympanic membrane normal.     Left Ear: Tympanic membrane normal.     Nose: Nose normal.     Mouth/Throat:     Mouth: Mucous membranes are moist.     Pharynx: Posterior oropharyngeal erythema present.  Eyes:     Extraocular Movements: Extraocular movements intact.     Conjunctiva/sclera: Conjunctivae normal.  Cardiovascular:     Rate and Rhythm: Normal rate and regular rhythm.     Heart sounds: Normal heart sounds.  Pulmonary:     Effort: Pulmonary effort is normal. No respiratory distress.     Breath sounds: Normal breath sounds. No wheezing or rales.  Abdominal:     General: Bowel sounds are normal. There is no distension.     Palpations: Abdomen is soft.     Tenderness: There is no abdominal tenderness. There is no guarding.  Musculoskeletal:        General: Normal range of motion.     Cervical back: Normal range of motion and neck supple.  Skin:    General: Skin is warm and dry.  Neurological:     Mental Status: She is alert and oriented to person, place, and time.  Psychiatric:        Mood and Affect: Mood normal.        Thought Content: Thought content normal.        Judgment: Judgment normal.      UC Treatments / Results  Labs (all labs ordered are listed, but only abnormal results are displayed) Labs Reviewed - No data to display  EKG   Radiology No results found.  Procedures Procedures (including critical care time)  Medications Ordered in UC Medications - No data to display  Initial Impression / Assessment and Plan / UC Course  I have reviewed the triage vital signs and the nursing notes.  Pertinent labs & imaging results that were available during my care of the patient were reviewed by me and considered in my medical decision making (see chart for details).     Overall appears well, vital signs reassuring. Viscous lidocaine sent for lingering  mild sore throat, discussed supportive care  and monitoring for worsening sxs. Work note given to release as she is out of quarantine window.   Final Clinical Impressions(s) / UC Diagnoses   Final diagnoses:  [id number]     Discharge Instructions     Vaccine post-COVID CDC recommendations:  "People with COVID-19 who have symptoms should wait to be vaccinated until they have recovered from their illness and have met the criteria for discontinuing isolation; those without symptoms should also wait until they meet the criteria before getting vaccinated. This guidance also applies to people who get COVID-19 before getting their second dose of vaccine."     ED Prescriptions    Medication Sig Dispense Auth. Provider   lidocaine (XYLOCAINE) 2 % solution Use as directed 10 mLs in the mouth or throat as needed for mouth pain. 100 mL Volney American, [place]     PDMP not reviewed this encounter.   Volney American, [place] 04/07/20 1303

## 2020-04-07 NOTE — Discharge Instructions (Signed)
Vaccine post-COVID CDC recommendations:  "People with COVID-19 who have symptoms should wait to be vaccinated until they have recovered from their illness and have met the criteria for discontinuing isolation; those without symptoms should also wait until they meet the criteria before getting vaccinated. This guidance also applies to people who get COVID-19 before getting their second dose of vaccine."

## 2020-04-08 ENCOUNTER — Ambulatory Visit: Payer: Medicaid Other

## 2020-04-09 ENCOUNTER — Other Ambulatory Visit: Payer: Self-pay

## 2020-04-09 ENCOUNTER — Ambulatory Visit: Payer: Medicaid Other

## 2020-04-09 DIAGNOSIS — Z7185 Encounter for immunization safety counseling: Secondary | ICD-10-CM

## 2020-04-09 NOTE — Progress Notes (Signed)
Patient presents in nurse clinic for #2 Covid Vaccine. However, patient recently tested positive on 03/27/2020. Patient reports she is still having body aches and occasional cough. Precepted with Hens[place] and he recommended 28 days after infection. Patient is agreeable to this and we have rescheduled for 04/27/2020.   Patient requests a note for work stating she was here today. Note provided to patient.

## 2020-04-22 ENCOUNTER — Encounter (HOSPITAL_COMMUNITY): Payer: Self-pay | Admitting: Emergency Medicine

## 2020-04-22 ENCOUNTER — Other Ambulatory Visit: Payer: Self-pay

## 2020-04-22 ENCOUNTER — Emergency Department (HOSPITAL_COMMUNITY): Payer: Medicaid Other

## 2020-04-22 ENCOUNTER — Emergency Department (HOSPITAL_COMMUNITY)
Admission: EM | Admit: 2020-04-22 | Discharge: 2020-04-22 | Disposition: A | Discharge status: Home / Self Care | Payer: Medicaid Other | Attending: Emergency Medicine | Admitting: Emergency Medicine

## 2020-04-22 ENCOUNTER — Ambulatory Visit: Payer: Medicaid Other

## 2020-04-22 DIAGNOSIS — Z8616 Personal history of COVID-19: Secondary | ICD-10-CM | POA: Diagnosis not present

## 2020-04-22 DIAGNOSIS — Z7951 Long term (current) use of inhaled steroids: Secondary | ICD-10-CM | POA: Diagnosis not present

## 2020-04-22 DIAGNOSIS — J4520 Mild intermittent asthma, uncomplicated: Secondary | ICD-10-CM | POA: Insufficient documentation

## 2020-04-22 DIAGNOSIS — F17210 Nicotine dependence, cigarettes, uncomplicated: Secondary | ICD-10-CM | POA: Diagnosis not present

## 2020-04-22 DIAGNOSIS — R0789 Other chest pain: Secondary | ICD-10-CM | POA: Diagnosis not present

## 2020-04-22 DIAGNOSIS — E119 Type 2 diabetes mellitus without complications: Secondary | ICD-10-CM | POA: Insufficient documentation

## 2020-04-22 DIAGNOSIS — R079 Chest pain, unspecified: Secondary | ICD-10-CM

## 2020-04-22 LAB — CBC
HCT: 35.1 % — ABNORMAL LOW (ref 36.0–46.0)
Hemoglobin: 11.5 g/dL — ABNORMAL LOW (ref 12.0–15.0)
MCH: 31 pg (ref 26.0–34.0)
MCHC: 32.8 g/dL (ref 30.0–36.0)
MCV: 94.6 fL (ref 80.0–100.0)
Platelets: 257 10*3/uL (ref 150–400)
RBC: 3.71 MIL/uL — ABNORMAL LOW (ref 3.87–5.11)
RDW: 12.3 % (ref 11.5–15.5)
WBC: 4.8 10*3/uL (ref 4.0–10.5)
nRBC: 0 % (ref 0.0–0.2)

## 2020-04-22 LAB — BASIC METABOLIC PANEL
Anion gap: 8 (ref 5–15)
BUN: 17 mg/dL (ref 6–20)
CO2: 28 mmol/L (ref 22–32)
Calcium: 9.2 mg/dL (ref 8.9–10.3)
Chloride: 105 mmol/L (ref 98–111)
Creatinine, Ser: 1.09 mg/dL — ABNORMAL HIGH (ref 0.44–1.00)
GFR, Estimated: >60 mL/min (ref >60)
Glucose, Bld: 77 mg/dL (ref 70–99)
Potassium: 3.3 mmol/L — ABNORMAL LOW (ref 3.5–5.1)
Sodium: 141 mmol/L (ref 135–145)

## 2020-04-22 LAB — TROPONIN I (HIGH SENSITIVITY)
Troponin I (High Sensitivity): 5 ng/L (ref <18)
Troponin I (High Sensitivity): 5 ng/L (ref <18)

## 2020-04-22 LAB — I-STAT BETA HCG BLOOD, ED (NOT ORDERABLE): I-stat hCG, quantitative: <5 m[IU]/mL (ref <5)

## 2020-04-22 NOTE — ED Triage Notes (Signed)
Patient reports a sore throat today, pain with swallowing. Also reports some back pain. Denies fevers. Was COVID + on 03/27/20. Woke up with chest pain last night, felt like a 'brick' was sitting on her chest.

## 2020-04-22 NOTE — ED Notes (Signed)
Patient has a blue in the main lab 

## 2020-04-22 NOTE — ED Provider Notes (Signed)
[facility] Provider Note   CSN: [id number] Arrival date & time: 04/22/20  1522     History Chief Complaint  Patient presents with  . Chest Pain    Meghan Barrett is a 50 y.o. female.  Patient states she is present to the ER for chief complaint of chest pain.  Describes it as a Government social research officer on her left chest.  Occurred earlier today about 2 hours prior to arrival.  Lasted about 2030 minutes and now has since resolved.  She no longer has any chest discomfort or pressure sensation.  Denies any fevers or cough.  No vomiting or diarrhea no shortness of breath no diaphoresis.  No pain elsewhere on her extremities.  She states she had COVID about 3 weeks ago but has since improved from her Covid symptoms.  Denies any prior heart disease.  She has a history of smoking but quit several months ago.        Past Medical History:  Diagnosis Date  . Anxiety   . Asthma   . Depression   . Diabetes mellitus (HCC) dx'd 06/19/2013  . Hernia   . High cholesterol   . Hx of diabetes mellitus 01/27/2020  . Schizophrenia [facility])     Patient Active Problem List   Diagnosis Date Noted  . Hx of diabetes mellitus 01/27/2020  . Gastroesophageal reflux disease 01/27/2020  . Mild intermittent asthma 12/13/2019  . Hyperlipidemia 08/17/2015  . Schizophrenia (HCC) 06/19/2013    Past Surgical History:  Procedure Laterality Date  . APPENDECTOMY  08-06-15  . CESAREAN SECTION  1991; 1999; 2002  . FINGER FRACTURE SURGERY Left 2010  . TUBAL LIGATION  2002     OB History   No obstetric history on file.     Family History  Problem Relation Age of Onset  . Alcohol abuse Mother   . Drug abuse Mother   . Diabetes Mother   . Alcohol abuse Father   . Drug abuse Father   . Heart attack Father   . Diabetes Father   . Diabetes Sister   . Early death Sister   . High Cholesterol Sister   . Cancer Other   . Stroke Other     Social History   Tobacco Use  . Smoking  status: Current Some Day Smoker    Years: 25.00    Types: Cigarettes  . Smokeless tobacco: Never Used  . Tobacco comment: 06/19/2013 "smoke ~ 5 cigarettes/month"  Substance Use Topics  . Alcohol use: Yes    Alcohol/week: 1.0 standard drink    Types: 1 Cans of beer per week  . Drug use: Yes    Types: "Crack" cocaine    Comment: 06/19/2013 "got off crack cocaine almost 1 yr ago"    Home Medications Prior to Admission medications   Medication Sig Start Date End Date Taking? Authorizing Provider  cetirizine (ZYRTEC) 10 MG tablet Take 1 tablet (10 mg total) by mouth daily. 12/09/19   Melene Plan, MD  FLUoxetine (PROZAC) 20 MG capsule Take 20 mg by mouth daily. 12/02/19   [provider]  fluticasone Aleda Grana) 50 MCG/ACT nasal spray  03/22/20   [provider]  lidocaine (XYLOCAINE) 2 % solution Use as directed 10 mLs in the mouth or throat as needed for mouth pain. 04/07/20   Particia Nearing, PA-C  omeprazole (PRILOSEC) 20 MG capsule Take 2 capsules (40 mg total) by mouth daily. 01/27/20   Autry-Lott, Randa Evens, DO  predniSONE (DELTASONE)  20 MG tablet Take 2 tablets (40 mg total) by mouth daily with breakfast. 03/27/20   Bing Neighbors, FNP  risperiDONE (RISPERDAL) 0.5 MG tablet Take 0.5 mg by mouth at bedtime. 12/02/19   [provider]    Allergies    Penicillins  Review of Systems   Review of Systems  Constitutional: Negative for fever.  HENT: Negative for ear pain.   Eyes: Negative for pain.  Respiratory: Positive for chest tightness. Negative for cough.   Cardiovascular: Positive for chest pain.  Gastrointestinal: Negative for abdominal pain.  Genitourinary: Negative for flank pain.  Musculoskeletal: Negative for back pain.  Skin: Negative for rash.  Neurological: Negative for headaches.    Physical Exam Updated Vital Signs BP 114/79 (BP Location: Right Arm)   Pulse 69   Temp 98.7 F (37.1 C) (Oral)   Resp 17   LMP 04/04/2020   SpO2 100%    Physical Exam Constitutional:      General: She is not in acute distress.    Appearance: Normal appearance.  HENT:     Head: Normocephalic.     Nose: Nose normal.  Eyes:     Extraocular Movements: Extraocular movements intact.  Cardiovascular:     Rate and Rhythm: Normal rate.  Pulmonary:     Effort: Pulmonary effort is normal.  Musculoskeletal:        General: Normal range of motion.     Cervical back: Normal range of motion.  Neurological:     General: No focal deficit present.     Mental Status: She is alert. Mental status is at baseline.     ED Results / Procedures / Treatments   Labs (all labs ordered are listed, but only abnormal results are displayed) Labs Reviewed  BASIC METABOLIC PANEL - Abnormal; Notable for the following components:      Result Value   Potassium 3.3 (*)    Creatinine, Ser 1.09 (*)    All other components within normal limits  CBC - Abnormal; Notable for the following components:   RBC 3.71 (*)    Hemoglobin 11.5 (*)    HCT 35.1 (*)    All other components within normal limits  I-STAT BETA HCG BLOOD, ED ([facility], [facility], [facility] ONLY)  I-STAT BETA HCG BLOOD, ED (NOT ORDERABLE)  TROPONIN I (HIGH SENSITIVITY)  TROPONIN I (HIGH SENSITIVITY)    EKG None  Radiology DG Chest 2 View  Result Date: 04/22/2020 CLINICAL DATA:  Chest pain.  COVID positive on 03/27/2020 EXAM: CHEST - 2 VIEW COMPARISON:  03/18/2013 FINDINGS: The heart size and mediastinal contours are within normal limits. Both lungs are clear. The visualized skeletal structures are unremarkable. IMPRESSION: No active cardiopulmonary disease. Electronically Signed   By: Marlan Palau M.D.   On: 04/22/2020 16:37    Procedures Procedures   Medications Ordered in ED Medications - No data to display  ED Course  I have reviewed the triage vital signs and the nursing notes.  Pertinent labs & imaging results that were available during my care of the patient were reviewed by me and considered in  my medical decision making (see chart for details).    MDM Rules/Calculators/A&P                          EKG shows sinus rhythm, normal rate, no ST elevations depressions noted.  Chest x-ray unremarkable.  Troponin sent x2 both sets are negative.  Given work-up and lack of  symptoms at this time I doubt acute coronary syndrome, recommend outpatient follow-up cardiology within the week.  Advised me to return for worsening symptoms worsening pain or any additional concerns.  Final Clinical Impression(s) / ED Diagnoses Final diagnoses:  Chest pain, unspecified type    Rx / DC Orders ED Discharge Orders    None       Cheryll Cockayne, MD 04/22/20 2219

## 2020-04-22 NOTE — Discharge Instructions (Signed)
Call your primary care doctor or specialist as discussed in the next 2-3 days.   Return immediately back to the ER if:  Your symptoms worsen within the next 12-24 hours. You develop new symptoms such as new fevers, persistent vomiting, new pain, shortness of breath, or new weakness or numbness, or if you have any other concerns.  

## 2020-04-27 ENCOUNTER — Ambulatory Visit: Payer: Medicaid Other

## 2020-05-04 ENCOUNTER — Ambulatory Visit: Payer: Medicaid Other

## 2020-05-22 ENCOUNTER — Other Ambulatory Visit: Payer: Self-pay

## 2020-05-22 ENCOUNTER — Ambulatory Visit (INDEPENDENT_AMBULATORY_CARE_PROVIDER_SITE_OTHER): Payer: Medicaid Other | Admitting: Family Medicine

## 2020-05-22 ENCOUNTER — Other Ambulatory Visit (HOSPITAL_COMMUNITY)
Admission: RE | Admit: 2020-05-22 | Discharge: 2020-05-22 | Disposition: A | Discharge status: Home / Self Care | Payer: Medicaid Other | Source: Ambulatory Visit | Attending: Family Medicine | Admitting: Family Medicine

## 2020-05-22 VITALS — BP 105/70 | HR 80 | Ht 66.0 in | Wt 147.8 lb

## 2020-05-22 DIAGNOSIS — N760 Acute vaginitis: Secondary | ICD-10-CM

## 2020-05-22 DIAGNOSIS — A599 Trichomoniasis, unspecified: Secondary | ICD-10-CM | POA: Diagnosis not present

## 2020-05-22 DIAGNOSIS — B9689 Other specified bacterial agents as the cause of diseases classified elsewhere: Secondary | ICD-10-CM

## 2020-05-22 DIAGNOSIS — N898 Other specified noninflammatory disorders of vagina: Secondary | ICD-10-CM | POA: Diagnosis not present

## 2020-05-22 LAB — POCT WET PREP (WET MOUNT): Clue Cells Wet Prep Whiff POC: POSITIVE

## 2020-05-22 MED ORDER — METRONIDAZOLE 500 MG PO TABS: 500.0000 mg | ORAL_TABLET | Freq: Three times a day (TID) | ORAL | 0 refills | Status: DC

## 2020-05-22 MED ORDER — FLUCONAZOLE 150 MG PO TABS: 150.0000 mg | ORAL_TABLET | Freq: Once | ORAL | 0 refills | Status: AC

## 2020-05-22 NOTE — Patient Instructions (Signed)
We have collected a swab and have tested you for gonorrhea chlamydia.  I will prescribe a medication for any yeast infection.  If your results are abnormal, I will call you with any updates.

## 2020-05-22 NOTE — Assessment & Plan Note (Addendum)
Patient experiencing vaginal discharge, odor and pruritus for almost 2 weeks.  Based on pruritus, suspect patient has vaginal candidiasis, will check with wet prep and plan to treat with Diflucan.  Given it patient is a new sexual partner without barrier contraception, will also test for gonorrhea and chlamydia. Wet prep Gonorrhea chlamydia testing Diflucan prescribed

## 2020-05-22 NOTE — Progress Notes (Signed)
    SUBJECTIVE:   CHIEF COMPLAINT / HPI: vaginal itching   Vaginal pruritis and discharge Patient presents with vaginal pruritus, discharge in order since 05/09/2020.Meghan Barrett  Patient states that she has not had sexual intercourse for 8 months until 2/14 of last month.  She reports physicals with a new partner and they did not use protection.  Patient also states that her partner used some type of lubricant but she is not aware of the name and she is concerned if this may have irritated her vagina.  She states that her last menses was on 05/15/2020.  PERTINENT  PMH / PSH:  Asthma GERD Schizophrenia Diabetes  OBJECTIVE:   BP 105/70   Pulse 80   Ht 5\' 6"  (1.676 m)   Wt 147 lb 12.8 oz (67 kg)   SpO2 99%   BMI 23.86 kg/m   Genitalia:  Normal introitus for age, no external lesions, white watery/brown vaginal discharge, mucosa pink and moist, no vaginal or cervical lesions, no vaginal atrophy, no friaility or hemorrhage, normal uterus size and position, no adnexal masses or tenderness  General: Female appearing stated age in no acute distress  ASSESSMENT/PLAN:   Vaginal discharge Patient experiencing vaginal discharge, odor and pruritus for almost 2 weeks.  Based on pruritus, suspect patient has vaginal candidiasis, will check with wet prep and plan to treat with Diflucan.  Given it patient is a new sexual partner without barrier contraception, will also test for gonorrhea and chlamydia. Wet prep Gonorrhea chlamydia testing Diflucan prescribed    Trichomonas infection Contacted patient as results were given after end of visit. Informed patient of results and need to have partner treated as well as prescription for flagyll. Counseled on abstaining from sex and alcohol consumption.  -flagyll 500mg  TID x7 days      , MD [facility]

## 2020-05-24 DIAGNOSIS — A599 Trichomoniasis, unspecified: Secondary | ICD-10-CM | POA: Insufficient documentation

## 2020-05-24 DIAGNOSIS — B9689 Other specified bacterial agents as the cause of diseases classified elsewhere: Secondary | ICD-10-CM | POA: Insufficient documentation

## 2020-05-24 NOTE — Assessment & Plan Note (Signed)
Contacted patient as results were given after end of visit. Informed patient of results and need to have partner treated as well as prescription for flagyll. Counseled on abstaining from sex and alcohol consumption.  -flagyll 500mg  TID x7 days

## 2020-05-25 LAB — CERVICOVAGINAL ANCILLARY ONLY
Chlamydia: NEGATIVE
Comment: NEGATIVE
Comment: NORMAL
Neisseria Gonorrhea: NEGATIVE

## 2020-06-10 ENCOUNTER — Ambulatory Visit: Payer: Medicaid Other

## 2020-06-25 ENCOUNTER — Ambulatory Visit: Payer: Medicaid Other

## 2020-06-29 NOTE — Progress Notes (Deleted)
    SUBJECTIVE:   CHIEF COMPLAINT / HPI: "acid reflux, out of pills"   ***  PERTINENT  PMH / PSH: ***  OBJECTIVE:   There were no vitals taken for this visit.  ***  ASSESSMENT/PLAN:   No problem-specific Assessment & Plan notes found for this encounter.     Allayne Stack, DO [facility]   {    This will disappear when note is signed, click to select method of visit    :1}

## 2020-06-30 ENCOUNTER — Ambulatory Visit: Payer: Medicaid Other | Admitting: Family Medicine

## 2020-07-02 ENCOUNTER — Emergency Department (HOSPITAL_COMMUNITY)
Admission: EM | Admit: 2020-07-02 | Discharge: 2020-07-02 | Disposition: A | Discharge status: Home / Self Care | Payer: Medicaid Other | Attending: Emergency Medicine | Admitting: Emergency Medicine

## 2020-07-02 ENCOUNTER — Other Ambulatory Visit: Payer: Self-pay

## 2020-07-02 ENCOUNTER — Encounter (HOSPITAL_COMMUNITY): Payer: Self-pay | Admitting: Emergency Medicine

## 2020-07-02 DIAGNOSIS — F17210 Nicotine dependence, cigarettes, uncomplicated: Secondary | ICD-10-CM | POA: Insufficient documentation

## 2020-07-02 DIAGNOSIS — R21 Rash and other nonspecific skin eruption: Secondary | ICD-10-CM | POA: Insufficient documentation

## 2020-07-02 DIAGNOSIS — N898 Other specified noninflammatory disorders of vagina: Secondary | ICD-10-CM | POA: Insufficient documentation

## 2020-07-02 DIAGNOSIS — E119 Type 2 diabetes mellitus without complications: Secondary | ICD-10-CM | POA: Insufficient documentation

## 2020-07-02 DIAGNOSIS — J4520 Mild intermittent asthma, uncomplicated: Secondary | ICD-10-CM | POA: Insufficient documentation

## 2020-07-02 LAB — URINALYSIS, ROUTINE W REFLEX MICROSCOPIC
Bilirubin Urine: NEGATIVE
Glucose, UA: NEGATIVE mg/dL
Ketones, ur: NEGATIVE mg/dL
Leukocytes,Ua: NEGATIVE
Nitrite: NEGATIVE
Protein, ur: NEGATIVE mg/dL
Specific Gravity, Urine: 1.021 (ref 1.005–1.030)
pH: 5 (ref 5.0–8.0)

## 2020-07-02 LAB — PREGNANCY, URINE: Preg Test, Ur: NEGATIVE

## 2020-07-02 LAB — WET PREP, GENITAL
Clue Cells Wet Prep HPF POC: NONE SEEN
Sperm: NONE SEEN
Trich, Wet Prep: NONE SEEN
Yeast Wet Prep HPF POC: NONE SEEN

## 2020-07-02 MED ORDER — TRIAMCINOLONE ACETONIDE 0.1 % EX CREA: 1.0000 "application " | TOPICAL_CREAM | Freq: Two times a day (BID) | CUTANEOUS | 0 refills | Status: DC

## 2020-07-02 NOTE — Discharge Instructions (Addendum)
You were seen in the emergency department for vaginal discharge and a rash in your buttocks  Vaginal swab today was negative for bacterial vaginosis, yeast and bacterial vaginosis.  Vaginal swab that tested for gonorrhea and chlamydia still pending.  You declined treatment for gonorrhea and chlamydia since you have not been sexually active since your last STD testing 1 month ago. Lab will call you to notify if your gonorrhea and chlamydia testing is positive.   At this time, the cause of your vaginal discharge is unclear.  Please follow-up with your primary care doctor.  I prescribed a steroid cream that you can apply over the rash on buttocks.  Apply moisturizing ointment like Vaseline or Aquaphor as well.  Return to the ED for worsening or new symptoms

## 2020-07-02 NOTE — ED Triage Notes (Signed)
Patient reports itching rash to right buttock since yesterday. Reports intermittent history of same over the last few years. Recently treated for STD.

## 2020-07-02 NOTE — ED Provider Notes (Signed)
[facility]   CSN: [id number] Arrival date & time: 07/02/20  1222     History Chief Complaint  Patient presents with  . Rash    Meghan Barrett is a 50 y.o. female presents to the ED for evaluation of vaginal discharge and bumps on her right buttocks.  Reports on 3//2022 she was diagnosed with trichomonas.  She was prescribed 7 days of Flagyl as well as 1 dose of fluconazole.  She finished these medicines.  Reports her vaginal discharge initially went away but in the last 2 days she has had return of vaginal discharge.  This is described as white, brown, associated with vaginal itching.  She thinks that her STD came back or the medicines did not work.  1 week ago she noticed dry, very itchy bumps in between her buttocks.  She used over-the-counter hydrocortisone cream and the bumps went away.  Over the last couple of days these bumps have come back and are not going away with hydrocortisone cream.  Has tried using Monistat intravaginally as well for her vaginal discharge without improvement.  She is sexually active with men only with inconsistent condom use.  In the last 9 months she has had intercourse with 1 person before she was diagnosed with trichomonas, states she has not had intercourse since her treatment.  Denies fevers, chills.  No dysuria.  No abdominal or pelvic pain.  Denies previous history of oral or genital warts or herpes.  HPI     Past Medical History:  Diagnosis Date  . Anxiety   . Asthma   . Depression   . Diabetes mellitus (HCC) dx'd 06/19/2013  . Hernia   . High cholesterol   . Hx of diabetes mellitus 01/27/2020  . Schizophrenia [facility]     Patient Active Problem List   Diagnosis Date Noted  . Trichomonas infection 05/24/2020  . Bacterial vaginosis 05/24/2020  . Vaginal discharge 05/22/2020  . Hx of diabetes mellitus 01/27/2020  . Gastroesophageal reflux disease 01/27/2020  . Mild intermittent asthma 12/13/2019  .  Hyperlipidemia 08/17/2015  . Schizophrenia (HCC) 06/19/2013    Past Surgical History:  Procedure Laterality Date  . APPENDECTOMY  08-06-15  . CESAREAN SECTION  1991; 1999; 2002  . FINGER FRACTURE SURGERY Left 2010  . TUBAL LIGATION  2002     OB History   No obstetric history on file.     Family History  Problem Relation Age of Onset  . Alcohol abuse Mother   . Drug abuse Mother   . Diabetes Mother   . Alcohol abuse Father   . Drug abuse Father   . Heart attack Father   . Diabetes Father   . Diabetes Sister   . Early death Sister   . High Cholesterol Sister   . Cancer Other   . Stroke Other     Social History   Tobacco Use  . Smoking status: Current Some Day Smoker    Years: 25.00    Types: Cigarettes  . Smokeless tobacco: Never Used  . Tobacco comment: 06/19/2013 "smoke ~ 5 cigarettes/month"  Substance Use Topics  . Alcohol use: Yes    Alcohol/week: 1.0 standard drink    Types: 1 Cans of beer per week  . Drug use: Yes    Types: "Crack" cocaine    Comment: 06/19/2013 "got off crack cocaine almost 1 yr ago"    Home Medications Prior to Admission medications   Medication Sig Start Date End Date  Taking? Authorizing Provider  triamcinolone cream (KENALOG) 0.1 % Apply 1 application topically 2 (two) times daily. 07/02/20  Yes Liberty Handy, PA-C  cetirizine (ZYRTEC) 10 MG tablet Take 1 tablet (10 mg total) by mouth daily. 12/09/19   Melene Plan, MD  FLUoxetine (PROZAC) 20 MG capsule Take 20 mg by mouth daily. 12/02/19   [provider]  fluticasone Aleda Grana) 50 MCG/ACT nasal spray  03/22/20   [provider]  lidocaine (XYLOCAINE) 2 % solution Use as directed 10 mLs in the mouth or throat as needed for mouth pain. 04/07/20   Particia Nearing, PA-C  metroNIDAZOLE (FLAGYL) 500 MG tablet Take 1 tablet (500 mg total) by mouth 3 (three) times daily. 05/22/20   Simmons-Robinson, Makiera, MD  omeprazole (PRILOSEC) 20 MG capsule Take 2 capsules (40 mg  total) by mouth daily. 01/27/20   Autry-Lott, Randa Evens, DO  predniSONE (DELTASONE) 20 MG tablet Take 2 tablets (40 mg total) by mouth daily with breakfast. 03/27/20   Bing Neighbors, FNP  risperiDONE (RISPERDAL) 0.5 MG tablet Take 0.5 mg by mouth at bedtime. 12/02/19   [provider]    Allergies    Penicillins  Review of Systems   Review of Systems  Genitourinary: Positive for vaginal discharge.  Skin: Positive for rash.  All other systems reviewed and are negative.   Physical Exam Updated Vital Signs BP 93/68   Pulse 73   Temp 97.7 F (36.5 C) (Oral)   Resp 20   SpO2 100%   Physical Exam Vitals and nursing note reviewed.  Constitutional:      General: She is not in acute distress.    Appearance: She is well-developed.     Comments: NAD.  HENT:     Head: Normocephalic and atraumatic.     Right Ear: External ear normal.     Left Ear: External ear normal.     Nose: Nose normal.  Eyes:     General: No scleral icterus.    Conjunctiva/sclera: Conjunctivae normal.  Cardiovascular:     Rate and Rhythm: Normal rate and regular rhythm.     Heart sounds: Normal heart sounds. No murmur heard.   Pulmonary:     Effort: Pulmonary effort is normal.     Breath sounds: Normal breath sounds.  Abdominal:     Palpations: Abdomen is soft.     Tenderness: There is no abdominal tenderness.  Genitourinary:    Vagina: Vaginal discharge present.     Comments:  Exam performed with RN at bedside for assistance. External genitalia without lesions.  No groin lymphadenopathy.  Vaginal mucosa and cervix pink without lesions.  Moderate amount of runny white discharge in vaginal vault noted.  No CMT.  Nonpalpable, nontender adnexa.  Perianal skin normal without lesions. Musculoskeletal:        General: No deformity. Normal range of motion.     Cervical back: Normal range of motion and neck supple.  Skin:    General: Skin is warm and dry.     Capillary Refill: Capillary refill takes  less than 2 seconds.     Comments:  Cluster of skin colored papules, dry, non tender. No drainage, erythema, warmth, non tender. No abscess.   Neurological:     Mental Status: She is alert and oriented to person, place, and time.  Psychiatric:        Behavior: Behavior normal.        Thought Content: Thought content normal.  Judgment: Judgment normal.         ED Results / Procedures / Treatments   Labs (all labs ordered are listed, but only abnormal results are displayed) Labs Reviewed  WET PREP, GENITAL - Abnormal; Notable for the following components:      Result Value   WBC, Wet Prep HPF POC FEW (*)    All other components within normal limits  URINALYSIS, ROUTINE W REFLEX MICROSCOPIC - Abnormal; Notable for the following components:   APPearance HAZY (*)    Hgb urine dipstick SMALL (*)    Bacteria, UA RARE (*)    All other components within normal limits  PREGNANCY, URINE  GC/CHLAMYDIA PROBE AMP ([place]) NOT AT [facility]    EKG None  Radiology No results found.  Procedures Procedures   Medications Ordered in ED Medications - No data to display  ED Course  I have reviewed the triage vital signs and the nursing notes.  Pertinent labs & imaging results that were available during my care of the patient were reviewed by me and considered in my medical decision making (see chart for details).  Clinical Course as of 07/02/20 1651  Thu Jul 02, 2020  1639 WBC, Wet Prep HPF POC(!): FEW [CG]    Clinical Course User Index [CG] Jerrell Mylar   MDM Rules/Calculators/A&P                          50 year old female presents to the ED for vaginal discharge and recurrent lesions/rash on her buttocks. Vaginal discharge began after flagyl treatment for trichomonas.  Lesion/rash has improved in the past with steroid cream.  On exam, she has skin colored, dry papules that are nontender.  No vesicular lesions.  No drainage.  She has been using hydrocortisone  which has been helping.  Reports she was given a steroid cream previously by another doctor and she ran out of this medicine.  Given dryness, itchiness reasonable to treat with steroid cream.  She denies history of herpes.  Clinically there are no vesicular lesions, tenderness and herpes considered less likely. Wet prep negative.  Pelvic exam is otherwise unremarkable without CMT. Will discharge with steroid cream for rash. Pending gonorrhea, chlamydia swabs at discharge. Patient states she has not had sexual encounters since last gonorrhea, chlamydia testing one month ago. She declined empiric treatment. Will recommend PCP follow up.   Final Clinical Impression(s) / ED Diagnoses Final diagnoses:  Rash  Vaginal discharge    Rx / DC Orders ED Discharge Orders         Ordered    triamcinolone cream (KENALOG) 0.1 %  2 times daily        07/02/20 1651           Liberty Handy, PA-C 07/02/20 1653    Pricilla Loveless, MD 07/02/20 1749

## 2020-07-03 LAB — GC/CHLAMYDIA PROBE AMP (~~LOC~~) NOT AT ARMC
Chlamydia: NEGATIVE
Comment: NEGATIVE
Comment: NORMAL
Neisseria Gonorrhea: NEGATIVE

## 2020-07-21 ENCOUNTER — Encounter: Payer: Self-pay | Admitting: Family Medicine

## 2020-07-21 ENCOUNTER — Ambulatory Visit (INDEPENDENT_AMBULATORY_CARE_PROVIDER_SITE_OTHER): Payer: Medicaid Other | Admitting: Family Medicine

## 2020-07-21 ENCOUNTER — Other Ambulatory Visit: Payer: Self-pay

## 2020-07-21 VITALS — BP 101/66 | HR 75 | Ht 66.0 in | Wt 147.4 lb

## 2020-07-21 DIAGNOSIS — M25562 Pain in left knee: Secondary | ICD-10-CM | POA: Diagnosis not present

## 2020-07-21 DIAGNOSIS — M25561 Pain in right knee: Secondary | ICD-10-CM

## 2020-07-21 DIAGNOSIS — M545 Low back pain, unspecified: Secondary | ICD-10-CM

## 2020-07-21 DIAGNOSIS — G8929 Other chronic pain: Secondary | ICD-10-CM | POA: Diagnosis not present

## 2020-07-21 MED ORDER — DICLOFENAC SODIUM 1 % EX GEL: 4.0000 g | Freq: Four times a day (QID) | CUTANEOUS | 0 refills | Status: DC

## 2020-07-21 NOTE — Patient Instructions (Signed)
You have arthritis in your lower back, which has previously been seen on imaging so there is no further imaging that we need to do today. I recommend switching to taking Tylenol as your main medication for the pain and then using the Aleve as you need to for breakthrough pain. I also recommend using a heating pad and the voltaren gel. The same goes for your knee pain, I think that the voltaren gel will help out this pain well. If you are having worsening back pain or knee pain and cannot walk please get evaluated immediately.   If the pain is worsening or not improving, I recommend following up with your primary doctor to consider further treatment.

## 2020-07-21 NOTE — Progress Notes (Signed)
    SUBJECTIVE:   CHIEF COMPLAINT / HPI:   Low back pain Chronic low back pain for several years, notices it more when she is getting up after sitting, improved with rest.  Does report some radiation of the pain to her bilateral knees.  Does limit her from walking as much as she wants.  Currently taking Aleve with no improvement in symptoms.  Denies bowel/bladder incontinence, saddle anesthesia, mass, weight loss, night sweats.  Bilateral knee pain Patient reports years of bilateral knee pain, reports that it is below her "knee bone".  The pain sometimes does limit her walking and she is concerned that she may have arthritis in her knees.  Reports some improvement with rest, worsens with extensive activity.   PERTINENT  PMH / PSH: Schizophrenia  OBJECTIVE:   BP 101/66   Pulse 75   Ht 5\' 6"  (1.676 m)   Wt 147 lb 6.4 oz (66.9 kg)   LMP 07/17/2020   SpO2 99%   BMI 23.79 kg/m   General: Well-nourished, well-appearing, NAD MSK: Crepitus and mild tenderness to palpation on bilateral patellar tendons, bilateral knees with full passive and active ROM.  Mild pain in bilateral knees with active foot extension against resistance. -Lumbar paraspinal tenderness on the right, full ROM.  ASSESSMENT/PLAN:  Chronic back pain History and physical exam consistent with more arthritic pain.  CT scan completed in 2021 for renal study showed degenerative spinal changes in the lumbar spine at that time as well as in the pelvis.  Patient counseled to prefer Tylenol for pain treatment use Aleve as breakthrough pain treatment. - Tylenol as needed for pain - Recommended heating pad - Voltaren gel as needed  Chronic bilateral knee pain Patient with history of extensive walking, consideration that arthritic complaint could also be contributing to knee pain.  Physical exam consistent with possible patellar tendinitis/tendinosis bilaterally due to crepitus and patellar tendon.  Patient counseled on return  precautions, if worsening can consider need for further studies including x-ray to ensure no issues with the knee or if really concerned can consider MRI to evaluate for patellar tendon tears. - Tylenol and Voltaren gel as needed - Consider physical therapy if worsening -Counseled to follow-up in 2 to 3 weeks if worsening or no improvement with medication   2022, DO [facility]

## 2020-08-25 ENCOUNTER — Ambulatory Visit (INDEPENDENT_AMBULATORY_CARE_PROVIDER_SITE_OTHER): Payer: Medicaid Other | Admitting: Family Medicine

## 2020-08-25 ENCOUNTER — Encounter: Payer: Self-pay | Admitting: Family Medicine

## 2020-08-25 ENCOUNTER — Other Ambulatory Visit: Payer: Self-pay

## 2020-08-25 VITALS — BP 98/64 | HR 78 | Wt 141.4 lb

## 2020-08-25 DIAGNOSIS — M25562 Pain in left knee: Secondary | ICD-10-CM

## 2020-08-25 DIAGNOSIS — Z8639 Personal history of other endocrine, nutritional and metabolic disease: Secondary | ICD-10-CM

## 2020-08-25 DIAGNOSIS — G8929 Other chronic pain: Secondary | ICD-10-CM | POA: Diagnosis not present

## 2020-08-25 DIAGNOSIS — M25561 Pain in right knee: Secondary | ICD-10-CM

## 2020-08-25 DIAGNOSIS — M545 Low back pain, unspecified: Secondary | ICD-10-CM | POA: Diagnosis not present

## 2020-08-25 LAB — POCT GLYCOSYLATED HEMOGLOBIN (HGB A1C): Hemoglobin A1C: 5.6 % (ref 4.0–5.6)

## 2020-08-25 NOTE — Progress Notes (Signed)
    SUBJECTIVE:   CHIEF COMPLAINT / HPI:   Ms. File is a 50 yo F who presents for follow up for the below issue:   Bilateral knee pain On going for several months with recent worsening. Denies any known injury, catching, or popping. Improves with rest but is difficult to get going first thing in the morning. Hurts mostly with walking.   Low mid back pain  Over several years. Believes this occurred following childbirth. Location between hip in her low back. Does not radiate down her legs. Denies loss of sensation or issues with bowel or bladder. States they found arthritis years ago on a renal test.   Hx of DM Current Regimen: No medications Last A1c: 5.4 on 01/2020  Denies polyuria or polydipsia  PERTINENT  PMH / PSH: HLD, Schizophrenia  OBJECTIVE:   BP 98/64   Pulse 78   Wt 141 lb 6.4 oz (64.1 kg)   SpO2 97%   BMI 22.82 kg/m   General: Appears well, no acute distress. Age appropriate. Cardiac: RRR, normal heart sounds, no murmurs Respiratory: CTAB, normal effort MSK:  Bilateral hip exam No deformity. FROM with 5/5 strength. Low back tender to palpation over PSIS NVI distally. Negative logroll Negative fadir, and piriformis stretches. Decreased ROM bilaterally with FABER stretch.  Bilateral knee exam No gross deformity, ecchymoses, swelling. Medial joint line TTP bilaterally. FROM with normal strength. Negative ant/post drawers. Negative valgus/varus testing. Negative lachman.  Negative mcmurrays, apleys. NV intact distally.  Imaging: CT ABDOMEN AND PELVIS WITHOUT CONTRAST 11/21/2019 COMPARISON:  08/16/2015 IMPRESSION: 1. Normal noncontrast examination of the kidneys and renal collecting system. No definite radiographic explanation for the patient's reported right flank pain. 2. Small bland appearing fluid collection adjacent to the terminal ileum, which could represent a postsurgical fluid collection such as a seroma or chronic hematoma, or a congenital  lesion such as a enteric duplication cyst or peritoneal inclusion cyst. This appears unchanged from prior study. 3. Mild ascending colonic diverticulosis without superimposed inflammatory change.  ASSESSMENT/PLAN:   Chronic pain of both knees Saw Dr. Clayborne Artist in office 5/3. Here today for follow up and worsening symptoms. HPI and physical exam consistent with arthritis but have some concern with joint line tenderness, question involvement of meniscus. Unable to afford voltaren gel. Plan to refer to [facility] with hope of further imaging, home stretches and/or PT if appropriate. Patient does not desire injection at this time.  -Continue tylenol and NSAID for pain relief. -Referral to [facility]  Chronic bilateral low back pain without sciatica Follow up as above. Exam consistent with SI joint dysfunction. Patient to be deferred to [facility]. Would consider home exercises v. PT depending on going to PT for the above.  - Anti-inflammatory has greater utility here -Consider SI joint stretches  -Referral to [facility]  Hx of diabetes mellitus A1c 5.6 today. No medications.  -Continue lifestyle modifications -F/u A1c in 6 months-1 year   Lavonda Jumbo, DO [facility]

## 2020-08-25 NOTE — Patient Instructions (Addendum)
It was wonderful to see you today.  Today you were referred to the sports medicine center. In the meantime can continue tylenol for pain control.   If you haven't already, sign up for My Chart to have easy access to your labs results, and communication with your primary care physician.  Please call the clinic at [phone] if your symptoms worsen or you have any concerns. It was our pleasure to serve you.  Dr. Salvadore Dom

## 2020-08-26 DIAGNOSIS — G8929 Other chronic pain: Secondary | ICD-10-CM | POA: Insufficient documentation

## 2020-08-26 DIAGNOSIS — M25562 Pain in left knee: Secondary | ICD-10-CM | POA: Insufficient documentation

## 2020-08-26 DIAGNOSIS — M545 Low back pain, unspecified: Secondary | ICD-10-CM | POA: Insufficient documentation

## 2020-08-26 NOTE — Assessment & Plan Note (Addendum)
Follow up as above. Exam consistent with SI joint dysfunction. Patient to be deferred to [facility]. Would consider home exercises v. PT depending on going to PT for the above.  - Anti-inflammatory has greater utility here -Consider SI joint stretches  -Referral to [facility]

## 2020-08-26 NOTE — Assessment & Plan Note (Signed)
A1c 5.6 today. No medications.  -Continue lifestyle modifications -F/u A1c in 6 months-1 year

## 2020-08-26 NOTE — Assessment & Plan Note (Addendum)
Saw Dr. Clayborne Artist in office 5/3. Here today for follow up and worsening symptoms. HPI and physical exam consistent with arthritis but have some concern with joint line tenderness, question involvement of meniscus. Unable to afford voltaren gel. Plan to refer to [facility] with hope of further imaging, home stretches and/or PT if appropriate. Patient does not desire injection at this time.  -Continue tylenol and NSAID for pain relief. -Referral to [facility]

## 2020-09-16 ENCOUNTER — Ambulatory Visit: Payer: Medicaid Other | Admitting: Family Medicine

## 2020-10-06 ENCOUNTER — Encounter (HOSPITAL_COMMUNITY): Payer: Self-pay

## 2020-10-06 ENCOUNTER — Ambulatory Visit (HOSPITAL_COMMUNITY)
Admission: EM | Admit: 2020-10-06 | Discharge: 2020-10-06 | Disposition: A | Discharge status: Home / Self Care | Payer: Medicaid Other | Attending: Emergency Medicine | Admitting: Emergency Medicine

## 2020-10-06 ENCOUNTER — Other Ambulatory Visit: Payer: Self-pay

## 2020-10-06 DIAGNOSIS — R21 Rash and other nonspecific skin eruption: Secondary | ICD-10-CM | POA: Diagnosis not present

## 2020-10-06 MED ORDER — CLOTRIMAZOLE-BETAMETHASONE 1-0.05 % EX CREA: TOPICAL_CREAM | CUTANEOUS | 0 refills | Status: DC

## 2020-10-06 MED ORDER — CLOTRIMAZOLE-BETAMETHASONE 1-0.05 % EX CREA: TOPICAL_CREAM | CUTANEOUS | 1 refills | Status: DC

## 2020-10-06 NOTE — Discharge Instructions (Addendum)
Use cream over area twice a day until cleared, then continue use for 3-5 days after, if rash return can resume use of cream

## 2020-10-06 NOTE — ED Triage Notes (Signed)
Pt presents with rash on buttocks for over a week that has been unrelieved with medication prescribed by PCP.

## 2020-10-06 NOTE — ED Provider Notes (Signed)
[facility]    CSN: [id number] Arrival date & time: 10/06/20  1612      History   Chief Complaint Chief Complaint  Patient presents with   Rash    HPI Meghan Barrett is a 50 y.o. female.   Patient presents with rash on right buttocks intermittently for 4 months. Rash is pruritic and non draining. Tries not scratch area but will gently rubbed. Feels better after cleansing. Has been using kenalog cream with some improvement. Not currently sexually active. Denies vaginal discharge, odor, urinary frequency, urgency, abdominal pain, fever, chills. Per patient site swabbed for herpes, negative.   Past Medical History:  Diagnosis Date   Anxiety    Asthma    Depression    Diabetes mellitus (HCC) dx'd 06/19/2013   Hernia    High cholesterol    Hx of diabetes mellitus 01/27/2020   Schizophrenia [facility])     Patient Active Problem List   Diagnosis Date Noted   Chronic pain of both knees 08/26/2020   Chronic bilateral low back pain without sciatica 08/26/2020   Trichomonas infection 05/24/2020   Bacterial vaginosis 05/24/2020   Vaginal discharge 05/22/2020   Hx of diabetes mellitus 01/27/2020   Gastroesophageal reflux disease 01/27/2020   Mild intermittent asthma 12/13/2019   Hyperlipidemia 08/17/2015   Schizophrenia (HCC) 06/19/2013    Past Surgical History:  Procedure Laterality Date   APPENDECTOMY  08-06-15   CESAREAN SECTION  1991; 1999; 2002   FINGER FRACTURE SURGERY Left 2010   TUBAL LIGATION  2002    OB History   No obstetric history on file.      Home Medications    Prior to Admission medications   Medication Sig Start Date End Date Taking? Authorizing Provider  cetirizine (ZYRTEC) 10 MG tablet Take 1 tablet (10 mg total) by mouth daily. 12/09/19   Melene Plan, MD  clotrimazole-betamethasone (LOTRISONE) cream Apply to affected area 2 times daily prn 10/06/20   Valinda Hoar, NP  FLUoxetine (PROZAC) 20 MG capsule Take 20 mg by mouth daily.  12/02/19   [provider]  fluticasone Aleda Grana) 50 MCG/ACT nasal spray  03/22/20   [provider]  lidocaine (XYLOCAINE) 2 % solution Use as directed 10 mLs in the mouth or throat as needed for mouth pain. 04/07/20   Particia Nearing, PA-C  omeprazole (PRILOSEC) 20 MG capsule Take 2 capsules (40 mg total) by mouth daily. Patient taking differently: Take 40 mg by mouth daily as needed. 01/27/20   Autry-Lott, Randa Evens, DO  risperiDONE (RISPERDAL) 0.5 MG tablet Take 0.5 mg by mouth at bedtime. 12/02/19   [provider]  traZODone (DESYREL) 50 MG tablet Take by mouth. 03/06/20   [provider]  triamcinolone cream (KENALOG) 0.1 % Apply 1 application topically 2 (two) times daily. 07/02/20   Liberty Handy, PA-C    Family History Family History  Problem Relation Age of Onset   Alcohol abuse Mother    Drug abuse Mother    Diabetes Mother    Alcohol abuse Father    Drug abuse Father    Heart attack Father    Diabetes Father    Diabetes Sister    Early death Sister    High Cholesterol Sister    Cancer Other    Stroke Other     Social History Social History   Tobacco Use   Smoking status: Some Days    Years: 25.00    Types: Cigarettes  Smokeless tobacco: Never   Tobacco comments:    06/19/2013 "smoke ~ 5 cigarettes/month"  Substance Use Topics   Alcohol use: Yes    Alcohol/week: 1.0 standard drink    Types: 1 Cans of beer per week   Drug use: Yes    Types: "Crack" cocaine    Comment: 06/19/2013 "got off crack cocaine almost 1 yr ago"     Allergies   Penicillins   Review of Systems Review of Systems  Constitutional: Negative.   Respiratory: Negative.    Cardiovascular: Negative.   Skin:  Positive for rash. Negative for color change, pallor and wound.  Neurological: Negative.     Physical Exam Triage Vital Signs ED Triage Vitals  Enc Vitals Group     BP 10/06/20 1733 113/71     Pulse Rate 10/06/20 1733 66     Resp 10/06/20  1733 17     Temp 10/06/20 1733 98 F (36.7 C)     Temp Source 10/06/20 1733 Oral     SpO2 10/06/20 1733 96 %     Weight --      Height --      Head Circumference --      Peak Flow --      Pain Score 10/06/20 1731 4     Pain Loc --      Pain Edu? --      Excl. in GC? --    No data found.  Updated Vital Signs BP 113/71 (BP Location: Right Arm)   Pulse 66   Temp 98 F (36.7 C) (Oral)   Resp 17   LMP 09/18/2020   SpO2 96%   Visual Acuity Right Eye Distance:   Left Eye Distance:   Bilateral Distance:    Right Eye Near:   Left Eye Near:    Bilateral Near:     Physical Exam Constitutional:      Appearance: Normal appearance. She is normal weight.  HENT:     Head: Normocephalic.  Eyes:     Extraocular Movements: Extraocular movements intact.  Pulmonary:     Effort: Pulmonary effort is normal.  Skin:    Comments: Defer to photo below   Neurological:     General: No focal deficit present.     Mental Status: She is alert and oriented to person, place, and time. Mental status is at baseline.  Psychiatric:        Mood and Affect: Mood normal.        Behavior: Behavior normal.      UC Treatments / Results  Labs (all labs ordered are listed, but only abnormal results are displayed) Labs Reviewed - No data to display  EKG   Radiology No results found.  Procedures Procedures (including critical care time)  Medications Ordered in UC Medications - No data to display  Initial Impression / Assessment and Plan / UC Course  I have reviewed the triage vital signs and the nursing notes.  Pertinent labs & imaging results that were available during my care of the patient were reviewed by me and considered in my medical decision making (see chart for details).  Rash  Lotrisone bid to affected area until rash clears Cleanse area with mild soap and water daily.  Final Clinical Impressions(s) / UC Diagnoses   Final diagnoses:  Rash     Discharge Instructions       Use cream over area twice a day until cleared, then continue use for 3-5 days after, if  rash return can resume use of cream     ED Prescriptions     Medication Sig Dispense Auth. Provider   clotrimazole-betamethasone (LOTRISONE) cream  (Status: Discontinued) Apply to affected area 2 times daily prn 15 g Zilla Shartzer R, NP   clotrimazole-betamethasone (LOTRISONE) cream Apply to affected area 2 times daily prn 15 g Yessika Otte, Elita Boone, NP      PDMP not reviewed this encounter.   Valinda Hoar, NP 10/06/20 1859

## 2020-10-07 NOTE — Progress Notes (Deleted)
    SUBJECTIVE:   CHIEF COMPLAINT / HPI:   Chronic arthritis of knees bilaterally  PERTINENT  PMH / PSH: ***  OBJECTIVE:   LMP 09/18/2020   ***  ASSESSMENT/PLAN:   No problem-specific Assessment & Plan notes found for this encounter.     Meghan Jumbo, DO [facility]

## 2020-10-08 ENCOUNTER — Ambulatory Visit (INDEPENDENT_AMBULATORY_CARE_PROVIDER_SITE_OTHER): Payer: Medicaid Other | Admitting: Family Medicine

## 2020-10-08 DIAGNOSIS — Z5329 Procedure and treatment not carried out because of patient's decision for other reasons: Secondary | ICD-10-CM

## 2020-10-08 DIAGNOSIS — G8929 Other chronic pain: Secondary | ICD-10-CM

## 2020-10-16 ENCOUNTER — Ambulatory Visit: Payer: Medicaid Other | Admitting: Family Medicine

## 2020-11-06 ENCOUNTER — Ambulatory Visit (INDEPENDENT_AMBULATORY_CARE_PROVIDER_SITE_OTHER): Payer: Medicaid Other | Admitting: Family Medicine

## 2020-11-06 ENCOUNTER — Other Ambulatory Visit (HOSPITAL_COMMUNITY)
Admission: RE | Admit: 2020-11-06 | Discharge: 2020-11-06 | Disposition: A | Discharge status: Home / Self Care | Payer: Medicaid Other | Source: Ambulatory Visit | Attending: Family Medicine | Admitting: Family Medicine

## 2020-11-06 ENCOUNTER — Encounter: Payer: Self-pay | Admitting: Family Medicine

## 2020-11-06 ENCOUNTER — Other Ambulatory Visit: Payer: Self-pay

## 2020-11-06 VITALS — BP 118/70 | HR 80 | Ht 66.0 in | Wt 145.0 lb

## 2020-11-06 DIAGNOSIS — N760 Acute vaginitis: Secondary | ICD-10-CM

## 2020-11-06 DIAGNOSIS — B9689 Other specified bacterial agents as the cause of diseases classified elsewhere: Secondary | ICD-10-CM

## 2020-11-06 DIAGNOSIS — R238 Other skin changes: Secondary | ICD-10-CM

## 2020-11-06 DIAGNOSIS — Z113 Encounter for screening for infections with a predominantly sexual mode of transmission: Secondary | ICD-10-CM

## 2020-11-06 DIAGNOSIS — N898 Other specified noninflammatory disorders of vagina: Secondary | ICD-10-CM | POA: Insufficient documentation

## 2020-11-06 DIAGNOSIS — R21 Rash and other nonspecific skin eruption: Secondary | ICD-10-CM | POA: Insufficient documentation

## 2020-11-06 LAB — POCT WET PREP (WET MOUNT)
Clue Cells Wet Prep Whiff POC: POSITIVE
Trichomonas Wet Prep HPF POC: ABSENT

## 2020-11-06 MED ORDER — METRONIDAZOLE 500 MG PO TABS: 500.0000 mg | ORAL_TABLET | Freq: Two times a day (BID) | ORAL | 0 refills | Status: AC

## 2020-11-06 MED ORDER — VALACYCLOVIR HCL 500 MG PO TABS: 500.0000 mg | ORAL_TABLET | Freq: Two times a day (BID) | ORAL | 1 refills | Status: AC

## 2020-11-06 NOTE — Patient Instructions (Signed)
For the STD testing, I will call you with results if there is anything that we need to treat.  For the rash on your buttocks, I am sending in a medication called valacyclovir (Valtrex) you will take twice a day (take 1 in the morning and 1 at night for 3 days).  I am not sure if this is what is causing the rash but it is possible, if it helps the lesions and heals everything then it could be related to herpes.  If after he finished the medication you continue to have outbreaks we can place you on a daily medication to help prevent them.  If the rash gets worse or does not improve in the next week or so make sure to come back to the clinic and possibly to the dermatology clinic to check it out.

## 2020-11-06 NOTE — Assessment & Plan Note (Addendum)
Patient with a history of vaginal discharge with prior STDs positive including trichomoniasis.  New partner since last testing and partner has had sexual intercourse with another person.  White vaginal discharge present in canal, wet prep showed clue cells consistent with BV.  - POCT wet mount performed - GC/CH collected - HIV and RPR - Metronidazole 500 mg twice daily x7 days

## 2020-11-06 NOTE — Progress Notes (Signed)
    SUBJECTIVE:   CHIEF COMPLAINT / HPI:   Vaginal discharge  Patient reports that she has had some vaginal discharge since last Friday that she feels is more on the clear side.  She does not know any change in odor but does notice pruritus.  She does have a new partner since her last testing and reports that this partner has been sexually active with another person.  Gluteal rash Patient reports that she has been dealing with this rash since earlier this year in April and he goes through cycles of being somewhat cleared and then having a lesion pop up.  The lesions are typically pruritic but not extremely painful.  She has never been diagnosed with HSV and has had STD testing in the past.  She has previously been giving Kenalog 0.1% with some improvement in the lesions, most recently was given betamethasone-clotrimazole without any improvement in the symptoms.  PERTINENT  PMH / PSH: Reviewed  OBJECTIVE:   BP 118/70   Pulse 80   Ht 5\' 6"  (1.676 m)   Wt 145 lb (65.8 kg)   SpO2 97%   BMI 23.40 kg/m   General: NAD, well-appearing, well-nourished Respiratory: No respiratory distress, breathing comfortably, able to speak in full sentences Skin: warm and dry, open lesions on buttocks (pictures in media tab on 07/02/20, 10/06/20, 11/06/20) Psych: Appropriate affect and mood Pelvic exam: VULVA: normal appearing vulva with no masses, tenderness or lesions, VAGINA: vaginal discharge - white and thick, DNA probe for chlamydia and GC obtained, WET MOUNT done - results: clue cells, excessive bacteria, positive whiff test, exam chaperoned by 11/08/20, CMA.   ASSESSMENT/PLAN:   Vaginal discharge Patient with a history of vaginal discharge with prior STDs positive including trichomoniasis.  New partner since last testing and partner has had sexual intercourse with another person.  White vaginal discharge present in canal, wet prep showed clue cells consistent with BV.  - POCT wet mount  performed - GC/CH collected - HIV and RPR - Metronidazole 500 mg twice daily x7 days  Vesicular skin lesions Per chart review of photos, it appears that there may have been vesicular lesions in a cluster that was present on the buttocks several months prior.  Has intermittently reappeared.  Had been some improvement with Kenalog but no improvement with her recent steroid cream of clotrimazole betamethasone.  Lesions today are uncovered, unsure if they began as vesicular or not.  Does not appear to be consistent with a bacterial infection at this time, consideration that this could be related to HSV.  Culture not able to be collected today as no vesicles are intact.  Discussed with Dr. Vaughan Browner and patient and will trial outbreak treatment with valacyclovir to see if there is any improvement. - Valacyclovir 500 mg twice daily x3 days - Patient given return precautions - If no improvement or if worsening may need to consider dermatology clinic for further evaluation     Leveda Anna, DO [facility]

## 2020-11-06 NOTE — Assessment & Plan Note (Signed)
Per chart review of photos, it appears that there may have been vesicular lesions in a cluster that was present on the buttocks several months prior.  Has intermittently reappeared.  Had been some improvement with Kenalog but no improvement with her recent steroid cream of clotrimazole betamethasone.  Lesions today are uncovered, unsure if they began as vesicular or not.  Does not appear to be consistent with a bacterial infection at this time, consideration that this could be related to HSV.  Culture not able to be collected today as no vesicles are intact.  Discussed with Dr. Leveda Anna and patient and will trial outbreak treatment with valacyclovir to see if there is any improvement. - Valacyclovir 500 mg twice daily x3 days - Patient given return precautions - If no improvement or if worsening may need to consider dermatology clinic for further evaluation

## 2020-11-07 LAB — HIV ANTIBODY (ROUTINE TESTING W REFLEX): HIV Screen 4th Generation wRfx: NONREACTIVE

## 2020-11-07 LAB — RPR: RPR Ser Ql: NONREACTIVE

## 2020-11-09 LAB — CERVICOVAGINAL ANCILLARY ONLY
Chlamydia: NEGATIVE
Comment: NEGATIVE
Comment: NORMAL
Neisseria Gonorrhea: NEGATIVE

## 2020-11-17 ENCOUNTER — Telehealth (INDEPENDENT_AMBULATORY_CARE_PROVIDER_SITE_OTHER): Payer: Medicaid Other | Admitting: Family Medicine

## 2020-11-17 ENCOUNTER — Emergency Department (HOSPITAL_COMMUNITY)
Admission: EM | Admit: 2020-11-17 | Discharge: 2020-11-17 | Disposition: A | Discharge status: Home / Self Care | Payer: Medicaid Other | Attending: Emergency Medicine | Admitting: Emergency Medicine

## 2020-11-17 ENCOUNTER — Other Ambulatory Visit: Payer: Self-pay

## 2020-11-17 DIAGNOSIS — J4520 Mild intermittent asthma, uncomplicated: Secondary | ICD-10-CM | POA: Diagnosis not present

## 2020-11-17 DIAGNOSIS — J45909 Unspecified asthma, uncomplicated: Secondary | ICD-10-CM

## 2020-11-17 DIAGNOSIS — R059 Cough, unspecified: Secondary | ICD-10-CM

## 2020-11-17 DIAGNOSIS — T7840XD Allergy, unspecified, subsequent encounter: Secondary | ICD-10-CM | POA: Diagnosis not present

## 2020-11-17 DIAGNOSIS — J029 Acute pharyngitis, unspecified: Secondary | ICD-10-CM | POA: Diagnosis not present

## 2020-11-17 DIAGNOSIS — R0982 Postnasal drip: Secondary | ICD-10-CM | POA: Insufficient documentation

## 2020-11-17 DIAGNOSIS — Z5321 Procedure and treatment not carried out due to patient leaving prior to being seen by health care provider: Secondary | ICD-10-CM | POA: Insufficient documentation

## 2020-11-17 DIAGNOSIS — Z20822 Contact with and (suspected) exposure to covid-19: Secondary | ICD-10-CM | POA: Insufficient documentation

## 2020-11-17 LAB — SARS CORONAVIRUS 2 (TAT 6-24 HRS): SARS Coronavirus 2: NEGATIVE

## 2020-11-17 MED ORDER — FLUTICASONE PROPIONATE 50 MCG/ACT NA SUSP: 1.0000 | Freq: Every day | NASAL | 1 refills | Status: DC

## 2020-11-17 MED ORDER — CETIRIZINE HCL 10 MG PO TABS: 10.0000 mg | ORAL_TABLET | Freq: Every day | ORAL | 1 refills | Status: DC

## 2020-11-17 MED ORDER — MONTELUKAST SODIUM 10 MG PO TABS: 10.0000 mg | ORAL_TABLET | Freq: Every day | ORAL | 1 refills | Status: DC

## 2020-11-17 MED ORDER — ALBUTEROL SULFATE HFA 108 (90 BASE) MCG/ACT IN AERS: 2.0000 | INHALATION_SPRAY | Freq: Four times a day (QID) | RESPIRATORY_TRACT | 2 refills | Status: DC | PRN

## 2020-11-17 NOTE — ED Notes (Signed)
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

## 2020-11-17 NOTE — Progress Notes (Signed)
[facility] Telemedicine Visit  Patient consented to have virtual visit and was identified by name and date of birth. Method of visit: Telephone  Encounter participants: Patient: Meghan Barrett - located at [facility] ED Provider: Janit Pagan - located at [facility] office Others (if applicable): N/A  Chief Complaint: Asthma and allergy  HPI:  Asthma She complains of cough. This is a new problem. The current episode started yesterday. The problem occurs intermittently. The problem has been gradually improving (She took one antibiotic pill at home which help and her symptoms improved. The antibiotic starts with an M. She went to the ED to get seen, but had long wait time). The cough is productive of sputum (Whitish sputum production. No blood in it. Chest tightness last night, but not now). Associated symptoms include rhinorrhea. Pertinent negatives include no fever. Exacerbated by: Allergy season makes her symptoms worsens. Relieved by: Took home antibiotic which helped. Her past medical history is significant for asthma.    ROS: per HPI  Pertinent PMHx: PMX reviewed  Exam:  There were no vitals taken for this visit.  Respiratory: She could complete full sentences without difficulty  Assessment/Plan:  Mild intermittent asthma Currently in the ED. However, she stated she wanted to check herself out from the ED and requested an A/B prescription from [name]. I advised her that it is best she gets a full ED evaluation, including a chest X-ray, to see if she needs an antibiotic. Also, given that her symptoms started two days ago with stable ED vitals, including normal O2 Sat, I doubt she would need an A/B at this point. COVID-19 test result is currently pending. I discussed treating with allergy medicine and albuterol prn. I refilled her Zyrtec, Flonase and started her on Albuterol prn and Sigulair. ED precaution discussed. F/U with our clinic in the next 1-2 days if  there is no improvement. She will call the front office to schedule f/u appointment. She agreed with the plan.    Time spent during visit with patient: 15 minutes

## 2020-11-17 NOTE — ED Notes (Signed)
Pt left building.

## 2020-11-17 NOTE — ED Provider Notes (Signed)
Emergency Medicine Provider Triage Evaluation Note  Meghan Barrett , a 50 y.o. female  was evaluated in triage.  Pt complains of congestion.  Review of Systems  Positive: Sinus congestion, postnasal drips, chest congestion Negative: Fever, wheezing, cp, abd pain, sore throat  Physical Exam  BP 109/70 (BP Location: Left Arm)   Pulse 77   Temp 98.3 F (36.8 C) (Oral)   Resp 14   SpO2 97%  Gen:   Awake, no distress   Resp:  Normal effort  MSK:   Moves extremities without difficulty  Other:  Speaking in complete sentences.  Lungs CTAB  Medical Decision Making  Medically screening exam initiated at 11:01 AM.  Appropriate orders placed.  [name] was informed that the remainder of the evaluation will be completed by another provider, this initial triage assessment does not replace that evaluation, and the importance of remaining in the ED until their evaluation is complete.  Hx of asthma and chronic bronchitis.  Report having sinus congestion, post nasal drips and chest congestion x 2 days.  Wants to get abx.  Has been vaccinated for covid.    Fayrene Helper, PA-C 11/17/20 1103    Pollyann Savoy, MD 11/17/20 [phone]

## 2020-11-17 NOTE — Assessment & Plan Note (Addendum)
Currently in the ED. However, she stated she wanted to check herself out from the ED and requested an A/B prescription from [name]. I advised her that it is best she gets a full ED evaluation, including a chest X-ray, to see if she needs an antibiotic. Also, given that her symptoms started two days ago with stable ED vitals, including normal O2 Sat, I doubt she would need an A/B at this point. COVID-19 test result is currently pending. I discussed treating with allergy medicine and albuterol prn. I refilled her Zyrtec, Flonase and started her on Albuterol prn and Sigulair. ED precaution discussed. F/U with our clinic in the next 1-2 days if there is no improvement. She will call the front office to schedule f/u appointment. She agreed with the plan.

## 2020-11-17 NOTE — ED Triage Notes (Signed)
Pt here POV with c/o sore throat., post nasal drip. Pt history of asthma . Lung sounds clear, diminished. No distress noted. Pt states she just got a cat.

## 2020-11-17 NOTE — Patient Instructions (Signed)

## 2020-11-18 NOTE — Progress Notes (Signed)
Patient has appt in [facility]C on 9/2. Aquilla Solian, CMA

## 2020-11-19 NOTE — Progress Notes (Signed)
    SUBJECTIVE:   CHIEF COMPLAINT / HPI:   Sore Throat, Cough  Patient presents today due to concerns of continued postnasal drip.  States that she has allergies and asthma and typically has a flare every year that requires prednisone.  She has been coughing up white phlegm since Monday, 8/29.  Feels that her throat is scratchy.  She occasionally has sharp pains on the right side of her chest when she coughs.  She has an inhaler at home but has not been using it much, reports she did use it yesterday.  She is also been using over-the-counter cough syrup which has not helped.  She takes several different allergy medications and Flonase and has been compliant with this.  She had 3 leftover prednisone pills from her prescription last year and she took those earlier this week and feels that her cough has improved some.  She went to the emergency department on 8/30 and had a COVID test, however she did not wait to be fully evaluated.     PERTINENT  PMH / PSH:  Past Medical History:  Diagnosis Date   Anxiety    Asthma    Depression    Diabetes mellitus (HCC) dx'd 06/19/2013   Hernia    High cholesterol    Hx of diabetes mellitus 01/27/2020   Schizophrenia (HCC)     OBJECTIVE:   BP 113/78   Temp 98.6 F (37 C)   Ht 5\' 6"  (1.676 m)   BMI 23.40 kg/m  Pulse ox: 100%  General: NAD, pleasant, able to participate in exam HEENT: No pharyngeal erythema,nasal turbinates are erythematous and inflamed Cardiac: RRR, no murmurs. Respiratory: CTAB, normal effort, No wheezes, rales or rhonchi Skin: warm and dry, no rashes noted  ASSESSMENT/PLAN:   Post-nasal drip 50 y.o. female with a history of allergies and asthma who presents with symptoms consistent with postnasal drip.  Patient does not have any concerning symptoms.  No shortness of breath, fever, oxygen requirement.  Vitals are stable.  She reports mild right-sided pain, however, feel this is MSK in nature secondary to her cough given it only  occurs with coughing.  Other differentials can include viral URI which can include common cold, influenza, COVID-19, or number of other respiratory viruses.  She was recently tested for COVID and found to be negative.  Considered bacterial infection but given well-appearance, stable vitals and history, feel that this is less likely, though the patient could potentially develop sinus infection due to congestion. Plan: -Discussed return precautions -Discussed symptomatic treatment and the lack of need for antibiotics. -Sent prescription for 57 -She was previously prescribed omeprazole and did not take it.  Given that occasionally the cough happens with food, will give a prescription for Pepcid as well.   Medication Refills Refilled patient's triamcinolone cream which she has been using for vesicular lesions on her buttocks.  She has found this to be helpful.  [employer], DO [place] [facility]

## 2020-11-19 NOTE — Patient Instructions (Addendum)
It was great to meet you! Thank you for allowing me to participate in your care!  Our plans for today:  -I am prescribing a cough medication called Tessalon Perles. You can take this twice a day as needed for cough. -Let me know if this does not help. -I am also prescribing a medication that will help with reflux. Take this daily.   Take care and seek immediate care sooner if you develop any shortness of breath, high fevers, excess vomiting preventing you from consuming fluids, or other concerning symptoms please return or go to the emergency department.   Sabino Dick, DO Family Medicine Resident

## 2020-11-20 ENCOUNTER — Other Ambulatory Visit: Payer: Self-pay

## 2020-11-20 ENCOUNTER — Ambulatory Visit (INDEPENDENT_AMBULATORY_CARE_PROVIDER_SITE_OTHER): Payer: Medicaid Other | Admitting: Family Medicine

## 2020-11-20 DIAGNOSIS — R0982 Postnasal drip: Secondary | ICD-10-CM | POA: Diagnosis present

## 2020-11-20 MED ORDER — BENZONATATE 200 MG PO CAPS: 200.0000 mg | ORAL_CAPSULE | Freq: Two times a day (BID) | ORAL | 0 refills | Status: DC | PRN

## 2020-11-20 MED ORDER — FAMOTIDINE 10 MG PO TABS: 10.0000 mg | ORAL_TABLET | Freq: Two times a day (BID) | ORAL | 1 refills | Status: DC

## 2020-11-20 MED ORDER — TRIAMCINOLONE ACETONIDE 0.1 % EX CREA: 1.0000 "application " | TOPICAL_CREAM | Freq: Two times a day (BID) | CUTANEOUS | 0 refills | Status: DC

## 2020-11-20 NOTE — Assessment & Plan Note (Signed)
50 y.o. female with a history of allergies and asthma who presents with symptoms consistent with postnasal drip.  Patient does not have any concerning symptoms.  No shortness of breath, fever, oxygen requirement.  Vitals are stable.  She reports mild right-sided pain, however, feel this is MSK in nature secondary to her cough given it only occurs with coughing.  Other differentials can include viral URI which can include common cold, influenza, COVID-19, or number of other respiratory viruses.  She was recently tested for COVID and found to be negative.  Considered bacterial infection but given well-appearance, stable vitals and history, feel that this is less likely, though the patient could potentially develop sinus infection due to congestion. Plan: -Discussed return precautions -Discussed symptomatic treatment and the lack of need for antibiotics. -Sent prescription for [employer] -She was previously prescribed omeprazole and did not take it.  Given that occasionally the cough happens with food, will give a prescription for Pepcid as well.

## 2020-12-07 ENCOUNTER — Telehealth: Payer: Self-pay | Admitting: *Deleted

## 2020-12-07 NOTE — Telephone Encounter (Signed)
Patient called today and would like to speak with Dr. Clayborne Artist who saw her for her gyn concerns in august.  She has a couple of questions.  Patient didn't disclose any specifics to me.  Will forward to MD.  Burnard Hawthorne

## 2020-12-08 NOTE — Telephone Encounter (Signed)
Called patient back and discussed information about bacterial vaginosis, for which patient had been previously treated. Patient requested further information in the mail.   Pernell Lenoir, DO

## 2020-12-08 NOTE — Telephone Encounter (Signed)
Information put in outgoing mailbox. Aquilla Solian, CMA

## 2020-12-10 ENCOUNTER — Other Ambulatory Visit: Payer: Self-pay

## 2020-12-10 ENCOUNTER — Emergency Department (HOSPITAL_COMMUNITY)
Admission: EM | Admit: 2020-12-10 | Discharge: 2020-12-10 | Disposition: A | Discharge status: Home / Self Care | Payer: Medicaid Other | Attending: Emergency Medicine | Admitting: Emergency Medicine

## 2020-12-10 ENCOUNTER — Encounter (HOSPITAL_COMMUNITY): Payer: Self-pay

## 2020-12-10 ENCOUNTER — Emergency Department (HOSPITAL_COMMUNITY): Payer: Medicaid Other

## 2020-12-10 DIAGNOSIS — J4520 Mild intermittent asthma, uncomplicated: Secondary | ICD-10-CM | POA: Insufficient documentation

## 2020-12-10 DIAGNOSIS — Z7952 Long term (current) use of systemic steroids: Secondary | ICD-10-CM | POA: Diagnosis not present

## 2020-12-10 DIAGNOSIS — R079 Chest pain, unspecified: Secondary | ICD-10-CM | POA: Insufficient documentation

## 2020-12-10 DIAGNOSIS — M79602 Pain in left arm: Secondary | ICD-10-CM | POA: Diagnosis not present

## 2020-12-10 DIAGNOSIS — F17210 Nicotine dependence, cigarettes, uncomplicated: Secondary | ICD-10-CM | POA: Insufficient documentation

## 2020-12-10 DIAGNOSIS — R682 Dry mouth, unspecified: Secondary | ICD-10-CM | POA: Diagnosis not present

## 2020-12-10 DIAGNOSIS — E119 Type 2 diabetes mellitus without complications: Secondary | ICD-10-CM | POA: Insufficient documentation

## 2020-12-10 DIAGNOSIS — R0789 Other chest pain: Secondary | ICD-10-CM | POA: Insufficient documentation

## 2020-12-10 DIAGNOSIS — J029 Acute pharyngitis, unspecified: Secondary | ICD-10-CM | POA: Insufficient documentation

## 2020-12-10 DIAGNOSIS — Z5321 Procedure and treatment not carried out due to patient leaving prior to being seen by health care provider: Secondary | ICD-10-CM | POA: Insufficient documentation

## 2020-12-10 LAB — CBC
HCT: 37.2 % (ref 36.0–46.0)
Hemoglobin: 12 g/dL (ref 12.0–15.0)
MCH: 30.5 pg (ref 26.0–34.0)
MCHC: 32.3 g/dL (ref 30.0–36.0)
MCV: 94.4 fL (ref 80.0–100.0)
Platelets: 269 10*3/uL (ref 150–400)
RBC: 3.94 MIL/uL (ref 3.87–5.11)
RDW: 12.3 % (ref 11.5–15.5)
WBC: 4.7 10*3/uL (ref 4.0–10.5)
nRBC: 0 % (ref 0.0–0.2)

## 2020-12-10 LAB — BASIC METABOLIC PANEL
Anion gap: 9 (ref 5–15)
BUN: 11 mg/dL (ref 6–20)
CO2: 27 mmol/L (ref 22–32)
Calcium: 9.1 mg/dL (ref 8.9–10.3)
Chloride: 103 mmol/L (ref 98–111)
Creatinine, Ser: 0.8 mg/dL (ref 0.44–1.00)
GFR, Estimated: >60 mL/min (ref >60)
Glucose, Bld: 111 mg/dL — ABNORMAL HIGH (ref 70–99)
Potassium: 3.5 mmol/L (ref 3.5–5.1)
Sodium: 139 mmol/L (ref 135–145)

## 2020-12-10 LAB — TROPONIN I (HIGH SENSITIVITY): Troponin I (High Sensitivity): 3 ng/L (ref <18)

## 2020-12-10 MED ORDER — SODIUM CHLORIDE 0.9 % IV BOLUS
1000.0000 mL | Freq: Once | INTRAVENOUS | Status: AC
  Administered 2020-12-10: 1000 mL via INTRAVENOUS

## 2020-12-10 NOTE — ED Notes (Signed)
Pt ambulatory in ED lobby. 

## 2020-12-10 NOTE — ED Provider Notes (Signed)
[facility]-EMERGENCY DEPT Provider Note   CSN: [id number] Arrival date & time: 12/10/20  1803     History Chief Complaint  Patient presents with   Chest Pain    Meghan Barrett is a 50 y.o. female with PMHx high cholesterol, anxiety, diabetes, schizophrenia who presents to the ED today with multiple complaints.   Patient reports that she has had a dry mouth for the past 4 days.  She states she is unsure what the cause is.  She denies any vomiting or diarrhea or other losses.  She states that she has not felt thirsty in the past 4 days and therefore has not been drinking much of anything.  She states she tried to drink some water last night however again woke up this morning with a dry mouth prompting ED visit.  She is also complaining of gradual onset, constant, waxing and waning, pressure-like sensation in her right chest.  She also states she is having intermittent pain in her left arm.  She denies any shortness of breath with same.  Again no nausea, vomiting, diaphoresis.  She is a some day smoker.   Patient states that she is unsure if the symptoms could all be related to her diabetes.  She states that she has been "good" for 2 years and has not been on any diabetic medication.  Denies any new medications.  She does state that she has a new kitten in her household and has been taking allergy medicine however states that she has been taking allergy medicine throughout her lifetime and denies any new changes in same.   The history is provided by the patient and medical records.      Past Medical History:  Diagnosis Date   Anxiety    Asthma    Depression    Diabetes mellitus (HCC) dx'd 06/19/2013   Hernia    High cholesterol    Hx of diabetes mellitus 01/27/2020   Schizophrenia (HCC)     Patient Active Problem List   Diagnosis Date Noted   Post-nasal drip 11/20/2020   Vesicular skin lesions 11/06/2020   Chronic pain of both knees 08/26/2020   Chronic  bilateral low back pain without sciatica 08/26/2020   Trichomonas infection 05/24/2020   Bacterial vaginosis 05/24/2020   Vaginal discharge 05/22/2020   Hx of diabetes mellitus 01/27/2020   Gastroesophageal reflux disease 01/27/2020   Mild intermittent asthma 12/13/2019   Hyperlipidemia 08/17/2015   Schizophrenia (HCC) 06/19/2013    Past Surgical History:  Procedure Laterality Date   APPENDECTOMY  08-06-15   CESAREAN SECTION  1991; 1999; 2002   FINGER FRACTURE SURGERY Left 2010   TUBAL LIGATION  2002     OB History   No obstetric history on file.     Family History  Problem Relation Age of Onset   Alcohol abuse Mother    Drug abuse Mother    Diabetes Mother    Alcohol abuse Father    Drug abuse Father    Heart attack Father    Diabetes Father    Diabetes Sister    Early death Sister    High Cholesterol Sister    Cancer Other    Stroke Other     Social History   Tobacco Use   Smoking status: Some Days    Years: 25.00    Types: Cigarettes   Smokeless tobacco: Never   Tobacco comments:    06/19/2013 "smoke ~ 5 cigarettes/month"  Substance Use Topics   Alcohol  use: Yes    Alcohol/week: 1.0 standard drink    Types: 1 Cans of beer per week   Drug use: Yes    Types: "Crack" cocaine    Comment: 06/19/2013 "got off crack cocaine almost 1 yr ago"    Home Medications Prior to Admission medications   Medication Sig Start Date End Date Taking? Authorizing Provider  albuterol (VENTOLIN HFA) 108 (90 Base) MCG/ACT inhaler Inhale 2 puffs into the lungs every 6 (six) hours as needed for wheezing or shortness of breath. 11/17/20   Doreene Eland, MD  benzonatate (TESSALON) 200 MG capsule Take 1 capsule (200 mg total) by mouth 2 (two) times daily as needed for cough. 11/20/20   Sabino Dick, DO  cetirizine (ZYRTEC) 10 MG tablet Take 1 tablet (10 mg total) by mouth daily. 11/17/20   Doreene Eland, MD  clotrimazole-betamethasone (LOTRISONE) cream Apply to affected area  2 times daily prn 10/06/20   Valinda Hoar, NP  famotidine (PEPCID) 10 MG tablet Take 1 tablet (10 mg total) by mouth 2 (two) times daily. 11/20/20   Sabino Dick, DO  FLUoxetine (PROZAC) 20 MG capsule Take 20 mg by mouth daily. 12/02/19   [provider]  fluticasone (FLONASE) 50 MCG/ACT nasal spray Place 1 spray into both nostrils daily. 11/17/20   Doreene Eland, MD  lidocaine (XYLOCAINE) 2 % solution Use as directed 10 mLs in the mouth or throat as needed for mouth pain. 04/07/20   Particia Nearing, PA-C  montelukast (SINGULAIR) 10 MG tablet Take 1 tablet (10 mg total) by mouth at bedtime. Patient not taking: Reported on 11/20/2020 11/17/20   Doreene Eland, MD  risperiDONE (RISPERDAL) 0.5 MG tablet Take 0.5 mg by mouth at bedtime. 12/02/19   [provider]  traZODone (DESYREL) 50 MG tablet Take by mouth. 03/06/20   [provider]  triamcinolone cream (KENALOG) 0.1 % Apply 1 application topically 2 (two) times daily. 11/20/20   Sabino Dick, DO    Allergies    Penicillins  Review of Systems   Review of Systems  Constitutional:  Negative for chills and fever.  HENT:         + dry mouth  Respiratory:  Negative for cough and shortness of breath.   Cardiovascular:  Positive for chest pain.  Gastrointestinal:  Negative for abdominal pain, diarrhea, nausea and vomiting.  Endocrine: Negative for polydipsia and polyuria.  All other systems reviewed and are negative.  Physical Exam Updated Vital Signs BP 111/76   Pulse 76   Temp 97.8 F (36.6 C) (Oral)   Resp (!) 22   SpO2 100%   Physical Exam Vitals and nursing note reviewed.  Constitutional:      Appearance: She is not ill-appearing or diaphoretic.  HENT:     Head: Normocephalic and atraumatic.     Mouth/Throat:     Mouth: Mucous membranes are dry.  Eyes:     Conjunctiva/sclera: Conjunctivae normal.  Cardiovascular:     Rate and Rhythm: Normal rate and regular rhythm.      Pulses:          Radial pulses are 2+ on the right side and 2+ on the left side.  Pulmonary:     Effort: Pulmonary effort is normal.     Breath sounds: Normal breath sounds. No decreased breath sounds, wheezing, rhonchi or rales.  Chest:     Chest wall: No tenderness.  Abdominal:     Palpations: Abdomen is soft.  Tenderness: There is no abdominal tenderness. There is no guarding or rebound.  Musculoskeletal:     Cervical back: Neck supple.  Skin:    General: Skin is warm and dry.     Comments: Good skin turgor  Neurological:     Mental Status: She is alert.    ED Results / Procedures / Treatments   Labs (all labs ordered are listed, but only abnormal results are displayed) Labs Reviewed  BASIC METABOLIC PANEL - Abnormal; Notable for the following components:      Result Value   Glucose, Bld 111 (*)    All other components within normal limits  CBC  CBG MONITORING, ED  TROPONIN I (HIGH SENSITIVITY)    EKG EKG Interpretation  Date/Time:  Thursday December 10 2020 19:33:55 EDT Ventricular Rate:  61 PR Interval:  143 QRS Duration: 97 QT Interval:  419 QTC Calculation: 422 R Axis:   71 Text Interpretation: Sinus rhythm RSR' in V1 or V2, probably normal variant ST elev, probable normal early repol pattern No significant change since last tracing Confirmed by Gwyneth Sprout ([id number]) on 12/10/2020 9:18:17 PM  Radiology DG Chest 2 View  Result Date: 12/10/2020 CLINICAL DATA:  Intermittent chest pain and left arm pain. EXAM: CHEST - 2 VIEW COMPARISON:  April 22, 2020 FINDINGS: The heart size and mediastinal contours are within normal limits. Both lungs are clear. Multilevel degenerative changes seen within the mid and lower thoracic spine. IMPRESSION: No active cardiopulmonary disease. Electronically Signed   By: Aram Candela M.D.   On: 12/10/2020 19:30    Procedures Procedures   Medications Ordered in ED Medications  sodium chloride 0.9 % bolus 1,000 mL (1,000  mLs Intravenous New Bag/Given 12/10/20 2032)    ED Course  I have reviewed the triage vital signs and the nursing notes.  Pertinent labs & imaging results that were available during my care of the patient were reviewed by me and considered in my medical decision making (see chart for details).    MDM Rules/Calculators/A&P                           50 year old female presents to the ED today with complaint of dry mouth, chest pain, left arm pain for the past 4 days.  On arrival to the ED today vitals are stable and patient appears to be no acute distress.  EKG with early repol.  On my exam she does have dry mucous membranes however she has good skin turgor.  She denies any GI losses.  Difficult to say what she has a dry mouth.  She denies any new change in medications.  He does however report that she has not been on diabetic medication for over 2 years.  She denies any increase in thirst however questio if her glycemia could be causing dry mouth.  We will plan for ACS work-up in terms of chest pain.  She is nontachycardic and nonhypoxic.  Very low suspicion for PE at this time given she is overall comfortable appearing.  No concern for dissection at this time.  CBC without leukocytosis. Hgb stable at 12.0 BMP without electrolyte abnormalities. Glucose 111.  Troponin of 3. Given pain has been present for 4 days I do not feel pt requires repeat troponin testing.  CXR clear  Workup overall reassuring at this time. PT to be discharged home with PCP follow up. She is encouraged to increase the amount of water she drinks on  a daily basis to help with her dry mouth sensation. She is in agreement with plan and stable for discharge home.   This note was prepared using Dragon voice recognition software and may include unintentional dictation errors due to the inherent limitations of voice recognition software.   Final Clinical Impression(s) / ED Diagnoses Final diagnoses:  Nonspecific chest pain   Dry mouth    Rx / DC Orders ED Discharge Orders     None        Discharge Instructions      Your workup was overall reassuring at this time without any abnormalities.   Follow up with your PCP regarding ED visit today and for further evaluation. I would recommend increasing the amount of water you drink on a daily basis to help with  your dry mouth.   Return to the ED for any new/worsening symptoms       Tanda Rockers, Cordelia Poche 12/10/20 2156    Gwyneth Sprout, MD 12/11/20 1335

## 2020-12-10 NOTE — ED Notes (Signed)
Called x1 from lobby with no response.

## 2020-12-10 NOTE — ED Triage Notes (Signed)
Pt reports intermittent chest pain and left arm pain since Tuesday. She also reports feeling very dehydrated.

## 2020-12-10 NOTE — ED Notes (Signed)
Pt ambulatory in triage. 

## 2020-12-10 NOTE — ED Notes (Signed)
Patient made aware of plan of care 

## 2020-12-10 NOTE — Discharge Instructions (Addendum)
Your workup was overall reassuring at this time without any abnormalities.   Follow up with your PCP regarding ED visit today and for further evaluation. I would recommend increasing the amount of water you drink on a daily basis to help with  your dry mouth.   Return to the ED for any new/worsening symptoms

## 2020-12-10 NOTE — ED Notes (Signed)
Patient currently in xray ?

## 2020-12-24 ENCOUNTER — Encounter: Payer: Self-pay | Admitting: Student

## 2020-12-24 ENCOUNTER — Ambulatory Visit (INDEPENDENT_AMBULATORY_CARE_PROVIDER_SITE_OTHER): Payer: Medicaid Other | Admitting: Student

## 2020-12-24 ENCOUNTER — Other Ambulatory Visit: Payer: Self-pay

## 2020-12-24 VITALS — BP 98/62 | HR 74 | Ht 66.0 in | Wt 137.2 lb

## 2020-12-24 DIAGNOSIS — Z8639 Personal history of other endocrine, nutritional and metabolic disease: Secondary | ICD-10-CM

## 2020-12-24 DIAGNOSIS — R631 Polydipsia: Secondary | ICD-10-CM

## 2020-12-24 LAB — POCT UA - MICROSCOPIC ONLY: Epithelial cells, urine per micros: >20

## 2020-12-24 LAB — POCT GLYCOSYLATED HEMOGLOBIN (HGB A1C): Hemoglobin A1C: 5.6 % (ref 4.0–5.6)

## 2020-12-24 MED ORDER — FAMOTIDINE 10 MG PO TABS: 10.0000 mg | ORAL_TABLET | Freq: Two times a day (BID) | ORAL | 1 refills | Status: DC

## 2020-12-24 MED ORDER — POLYETHYLENE GLYCOL 3350 17 GM/SCOOP PO POWD: 17.0000 g | Freq: Every day | ORAL | 1 refills | Status: DC | PRN

## 2020-12-24 NOTE — Patient Instructions (Signed)
It was great to see you! Thank you for allowing me to participate in your care!  I recommend that you always bring your medications to each appointment as this makes it easy to ensure you are on the correct medications and helps [name] not miss when refills are needed.  Our plans for today:  - Please begin keeping a diary which records how much you drink and pee every day for the next week or two and bring it to you next appointment in about 2 weeks   We are checking some labs today, I will call you if they are abnormal will send you a MyChart message or a letter if they are normal.  If you do not hear about your labs in the next 2 weeks please let us know.  Take care and seek immediate care sooner if you develop any concerns.   Dr. Erick Alley, DO [facility]

## 2020-12-24 NOTE — Progress Notes (Signed)
    SUBJECTIVE:   CHIEF COMPLAINT / HPI: Excessive thirst PERTINENT  PMH / PSH: Asthma, GERD, schizophrenia, history of DM type II  GERD Having gas/burping, reflux and regurgitation needs refill of Pepid   Excessive thirst Has been feeling excessively thirsty for the past month.  Is currently drinking about four 12oz bottles a day but is still thirsty constantly even in the middle of the night. She gets up once a night to pee.  She has not noticed increased frequency of urination. She has had sinus headaches but thinks this is related to allergies and missing doses of her allergy medications. No light headedness.  Has had some occasional hot flashes during day and night, thinks this may be due to menopause. No fevers, no fatigue, has BM about once a week and strains.  She states she has not had any of her medications changed or increased recently.    OBJECTIVE:   BP 98/62   Pulse 74   Wt 137 lb 3.2 oz (62.2 kg)   LMP 12/16/2020   SpO2 96%   BMI 22.14 kg/m    General: NAD, pleasant, able to participate in exam Cardiac: RRR, no murmurs. Respiratory: CTAB, normal effort, No wheezes, rales or rhonchi Abdomen: Bowel sounds present, nontender, nondistended, no hepatosplenomegaly. Extremities: no edema or cyanosis. Skin: warm and dry Neuro: alert, no obvious focal deficits Psych: Normal affect and mood  ASSESSMENT/PLAN:   GERD -Pepcid refilled  -Excessive thirst As patient has a past history of type 2 diabetes, A1c was checked today and was 5.6.  Unfortunately I think wrong UA was ordered.  Patient can have a urinalysis at future visit in a few weeks to check for glucose, ketones, specific gravity.  I do not think that thirst is being caused by diabetes mellitus.  We will consider diabetes insipidus although I think this is unlikely considering she does not report excessive urination, fatigue, headache, or lightheadedness.  Will also take into consideration that patient has  diagnosis of schizophrenia which may be contributing to her perception of the symptom.  I have recommended the patient keep a diary of her symptoms, we will do some lab work and she should return in a few weeks for follow-up.  I do not think her thirst is likely related to medications as she has not changed or increased any of her medications recently.  I do not think any of her current medications have a common side effect of increased thirst. Depending on her electrolyte levels and results of her diary, may need further work-up including serum and urine osmolalities.  Patient reports that she only has 1 bowel movement per week and has to strain.  Constipation can be a symptom of dehydration and will be treated with MiraLAX. -CMP -UA at next visit -TSH -17 g MiraLAX daily as needed -1 week diary of urinary frequency and fluid intake daily  Pt would like to discuss health maintanance issues at next visit   Dr. Erick Alley, DO [facility]

## 2020-12-25 ENCOUNTER — Encounter: Payer: Self-pay | Admitting: Student

## 2020-12-25 LAB — COMPREHENSIVE METABOLIC PANEL
ALT: 10 IU/L (ref 0–32)
AST: 19 IU/L (ref 0–40)
Albumin/Globulin Ratio: 1.8 (ref 1.2–2.2)
Albumin: 4.9 g/dL — ABNORMAL HIGH (ref 3.8–4.8)
Alkaline Phosphatase: 71 IU/L (ref 44–121)
BUN/Creatinine Ratio: 12 (ref 9–23)
BUN: 12 mg/dL (ref 6–24)
Bilirubin Total: 0.4 mg/dL (ref 0.0–1.2)
CO2: 23 mmol/L (ref 20–29)
Calcium: 9.7 mg/dL (ref 8.7–10.2)
Chloride: 103 mmol/L (ref 96–106)
Creatinine, Ser: 1.02 mg/dL — ABNORMAL HIGH (ref 0.57–1.00)
Globulin, Total: 2.8 g/dL (ref 1.5–4.5)
Glucose: 80 mg/dL (ref 70–99)
Potassium: 4.6 mmol/L (ref 3.5–5.2)
Sodium: 142 mmol/L (ref 134–144)
Total Protein: 7.7 g/dL (ref 6.0–8.5)
eGFR: 68 mL/min/{1.73_m2} (ref >59)

## 2020-12-25 LAB — TSH: TSH: 1.33 u[IU]/mL (ref 0.450–4.500)

## 2020-12-25 NOTE — Progress Notes (Signed)
Letter sent to pt with recent lab results.  Pt will most likely want to discuss these results at her follow up visit.

## 2021-01-11 ENCOUNTER — Encounter: Payer: Self-pay | Admitting: Family Medicine

## 2021-01-11 ENCOUNTER — Other Ambulatory Visit: Payer: Self-pay

## 2021-01-11 ENCOUNTER — Ambulatory Visit (INDEPENDENT_AMBULATORY_CARE_PROVIDER_SITE_OTHER): Payer: Medicaid Other | Admitting: Family Medicine

## 2021-01-11 VITALS — BP 127/88 | HR 68 | Wt 144.8 lb

## 2021-01-11 DIAGNOSIS — K5900 Constipation, unspecified: Secondary | ICD-10-CM

## 2021-01-11 DIAGNOSIS — Z1211 Encounter for screening for malignant neoplasm of colon: Secondary | ICD-10-CM | POA: Diagnosis not present

## 2021-01-11 DIAGNOSIS — Z1212 Encounter for screening for malignant neoplasm of rectum: Secondary | ICD-10-CM

## 2021-01-11 MED ORDER — POLYETHYLENE GLYCOL 3350 17 GM/SCOOP PO POWD: 17.0000 g | Freq: Every day | ORAL | 1 refills | Status: AC

## 2021-01-11 NOTE — Patient Instructions (Signed)
Thank you for coming in today.  We discussed constipation.  For now we will take MiraLAX daily and increase our fiber intake via Metamucil and/or vegetables and fruits.  I have also referred you to the gastroenterologist for a colonoscopy.  You should get a call within the week to schedule this visit.  If you do not get a call please let us now.   Dr. Salvadore Dom

## 2021-01-11 NOTE — Progress Notes (Signed)
    SUBJECTIVE:   CHIEF COMPLAINT / HPI:   Meghan Barrett present for follow up for the below.   Constipation Every other day. It looks like pebbles and she has a good bowel movement every 3 days. Taking miralax only when she becomes constipated. Does not eat fruit or veggies. Denies blood in stool or abdominal pain. Desires to have a colonoscopy.   PERTINENT  PMH / PSH: GERD  OBJECTIVE:   BP 127/88   Pulse 68   Wt 144 lb 12.8 oz (65.7 kg)   LMP 12/16/2020   SpO2 100%   BMI 23.37 kg/m   General: Appears well, no acute distress. Age appropriate. Cardiac: RRR, normal heart sounds, no murmurs Respiratory: CTAB, normal effort Abdomen: soft, nontender, nondistended, no guarding no masses, NABS  ASSESSMENT/PLAN:  1. Constipation, unspecified constipation type Intermittent. Discussed taking miralax daily and increasing fiber in her diet. Plan to get colonoscopy for screening purposes.  - Ambulatory referral to Gastroenterology - polyethylene glycol powder (GLYCOLAX/MIRALAX) 17 GM/SCOOP powder; Take 17 g by mouth daily.  Dispense: 225 g; Refill: 1  2. Screening for colorectal cancer - Ambulatory referral to Gastroenterology  Meghan Jumbo, DO [facility]

## 2021-02-05 ENCOUNTER — Other Ambulatory Visit: Payer: Self-pay | Admitting: Family Medicine

## 2021-02-23 ENCOUNTER — Ambulatory Visit (INDEPENDENT_AMBULATORY_CARE_PROVIDER_SITE_OTHER): Payer: Medicaid Other | Admitting: Family Medicine

## 2021-02-23 ENCOUNTER — Ambulatory Visit (HOSPITAL_COMMUNITY)
Admission: RE | Admit: 2021-02-23 | Discharge: 2021-02-23 | Disposition: A | Discharge status: Home / Self Care | Payer: Medicaid Other | Source: Ambulatory Visit | Attending: Family Medicine | Admitting: Family Medicine

## 2021-02-23 ENCOUNTER — Other Ambulatory Visit: Payer: Self-pay

## 2021-02-23 ENCOUNTER — Ambulatory Visit: Payer: Medicaid Other

## 2021-02-23 VITALS — BP 104/68 | HR 69 | Ht 66.0 in | Wt 142.2 lb

## 2021-02-23 DIAGNOSIS — K219 Gastro-esophageal reflux disease without esophagitis: Secondary | ICD-10-CM | POA: Diagnosis not present

## 2021-02-23 DIAGNOSIS — M25561 Pain in right knee: Secondary | ICD-10-CM | POA: Diagnosis present

## 2021-02-23 DIAGNOSIS — G8929 Other chronic pain: Secondary | ICD-10-CM

## 2021-02-23 DIAGNOSIS — D509 Iron deficiency anemia, unspecified: Secondary | ICD-10-CM | POA: Diagnosis not present

## 2021-02-23 DIAGNOSIS — M79672 Pain in left foot: Secondary | ICD-10-CM | POA: Diagnosis not present

## 2021-02-23 DIAGNOSIS — M25562 Pain in left knee: Secondary | ICD-10-CM | POA: Diagnosis present

## 2021-02-23 MED ORDER — MONTELUKAST SODIUM 10 MG PO TABS: 10.0000 mg | ORAL_TABLET | Freq: Every day | ORAL | 1 refills | Status: DC

## 2021-02-23 MED ORDER — DICLOFENAC SODIUM 1 % EX GEL: CUTANEOUS | 1 refills | Status: DC

## 2021-02-23 NOTE — Assessment & Plan Note (Signed)
Patient reports intermittent abdominal pain and is concerned for hernia.  No abdominal pain or hernia on my examination.  I have asked her to call gastroenterology given her history of anemia.  Repeat CBC and CMP today.

## 2021-02-23 NOTE — Progress Notes (Signed)
SUBJECTIVE:   CHIEF COMPLAINT: neck pain and stomach pain  HPI:   Meghan Barrett is a 50 y.o. yo with history notable for type 2 diabetes and mood disorder presenting for neck and stomach pain.  The patient presents today with concerns for pain all over her body.  She reports her aunt told her she should obtain a muscle relaxant for this.  She was recently told by physician that she had arthritis all over her body.  She is not sure who told her this.  She denies having any x-ray imaging or lab testing confirming this.  The patient would like her ankles and knees evaluated today.  The patient reports intermittent knee pain.  She works at [employer].  At the end of the day she notices both of her knees are sore.  Left is worse than right.  Pain is medially located.  She denies erythema, swelling, locking catching, prior injury.  She denies a family history of rheumatoid arthritis or other inflammatory arthritis.  The patient also reports intermittent ankle pain and foot pain.  She reports most painful is over her left foot.  She does report some intermittent swelling of her left foot.  She not sure if he has had any trauma to this.  She has noted this for the past several months or years she thinks.  She denies redness, prior surgery to the area.  She has tried Aleve once a day for her pain which helps her pain during the day.  She takes this 3 to 5 days a week at most.  She has tried nothing else for her pain.  The patient reports intermittent abdominal pain.  She is concerned she has a hernia.  This occurred after her child was born.  No nausea, vomiting, constipation, weight loss diarrhea, melena or hematochezia.   Current Outpatient Medications:    albuterol (VENTOLIN HFA) 108 (90 Base) MCG/ACT inhaler, Inhale 2 puffs into the lungs every 6 (six) hours as needed for wheezing or shortness of breath., Disp: 8 g, Rfl: 2   cetirizine (ZYRTEC) 10 MG tablet, TAKE 1 TABLET(10 MG) BY MOUTH  DAILY, Disp: 30 tablet, Rfl: 1   diclofenac Sodium (VOLTAREN) 1 % GEL, Apply to left knee and left foot for pain, Disp: 100 g, Rfl: 1   FLUoxetine (PROZAC) 20 MG capsule, Take 20 mg by mouth daily., Disp: , Rfl:    traZODone (DESYREL) 50 MG tablet, Take by mouth., Disp: , Rfl:    clotrimazole-betamethasone (LOTRISONE) cream, Apply to affected area 2 times daily prn, Disp: 15 g, Rfl: 1   famotidine (PEPCID) 10 MG tablet, Take 1 tablet (10 mg total) by mouth 2 (two) times daily., Disp: 30 tablet, Rfl: 1   fluticasone (FLONASE) 50 MCG/ACT nasal spray, Place 1 spray into both nostrils daily., Disp: 16 g, Rfl: 1   lidocaine (XYLOCAINE) 2 % solution, Use as directed 10 mLs in the mouth or throat as needed for mouth pain. (Patient not taking: Reported on 12/24/2020), Disp: 100 mL, Rfl: 0   montelukast (SINGULAIR) 10 MG tablet, Take 1 tablet (10 mg total) by mouth at bedtime., Disp: 30 tablet, Rfl: 1   polyethylene glycol powder (GLYCOLAX/MIRALAX) 17 GM/SCOOP powder, Take 17 g by mouth daily., Disp: 225 g, Rfl: 1   risperiDONE (RISPERDAL) 0.5 MG tablet, Take 0.5 mg by mouth at bedtime. (Patient not taking: Reported on 02/23/2021), Disp: , Rfl:    triamcinolone cream (KENALOG) 0.1 %, Apply 1 application topically  2 (two) times daily., Disp: 30 g, Rfl: 0    PERTINENT  PMH / PSH/Family/Social History : Updated and reviewed as appropriate  OBJECTIVE:   BP 104/68   Pulse 69   Ht 5\' 6"  (1.676 m)   Wt 142 lb 3.2 oz (64.5 kg)   LMP 02/18/2021 (Exact Date)   SpO2 100%   BMI 22.95 kg/m   Today's weight:  Last Weight  Most recent update: 02/23/2021  1:38 PM    Weight  64.5 kg (142 lb 3.2 oz)            Review of prior weights: Filed Weights   02/23/21 1337  Weight: 142 lb 3.2 oz (64.5 kg)     Cardiac: Regular rate and rhythm. Normal S1/S2. No murmurs, rubs, or gallops appreciated. Lungs: Clear bilaterally to ascultation.  Abdomen: Normoactive bowel sounds. No tenderness to deep or light  palpation. No rebound or guarding.  No hernias appreciated she does have mild diastases recti which she reports is her hernia Psych: Pleasant and appropriate   MSK exam Bilateral knee exam no deformity normal-appearing symmetric knees no overlying erythema.  There is mild crepitus but good range of motion.  She has tenderness along the medial joint line left greater than right. Bilateral pes planus noted Ankles without edema there is no effusion erythema or swelling of the ankles Over the left second metatarsal there is a hard somewhat painful nodule at the head of the metatarsal.  There is no clicking associated with this. ASSESSMENT/PLAN:   Chronic pain of both knees Suspect osteoarthritis.  Doubt inflammatory arthritis given her clinical history.  Recommended exercises and continued walking.  Recommended supportive shoes.  Will obtain x-rays to confirm diagnosis.  Recommend topical Voltaren gel can use Aleve sparingly.  Gastroesophageal reflux disease Patient reports intermittent abdominal pain and is concerned for hernia.  No abdominal pain or hernia on my examination.  I have asked her to call gastroenterology given her history of anemia.  Repeat CBC and CMP today.   Left foot nodule suspect either cyst or possibly Morton's neuroma, will obtain x-ray imaging to evaluate for underlying arthritis.  Recommended Voltaren gel to area.  Consider referral to sports medicine for special foot inserts in future.  Follow-up with PCP scheduled, the patient has difficulty navigating healthcare system and I suspect she would benefit from regularly scheduled every 2 to 38-month visits with her primary care physician.    2-month, MD  Family Medicine Teaching Service  [facility]

## 2021-02-23 NOTE — Patient Instructions (Addendum)
It was wonderful to see you today.  Please bring ALL of your medications with you to every visit.   Today we talked about:   An x-ray was ordered for you---you do not need an appointment to have this completed.  I recommend going to [facility] [address] [place] [place] [address] [place]   If the results are normal,I will send you a letter  I will call you with results if anything is abnormal     Please call Gastroenterology about your stomach  [phone]  Thank you for choosing [facility].   Please call [phone] with any questions about today's appointment.  Please be sure to schedule follow up at the front  desk before you leave today--please schedule with Dr. Salvadore Dom.   Terisa Starr, MD  Family Medicine

## 2021-02-23 NOTE — Assessment & Plan Note (Signed)
Suspect osteoarthritis.  Doubt inflammatory arthritis given her clinical history.  Recommended exercises and continued walking.  Recommended supportive shoes.  Will obtain x-rays to confirm diagnosis.  Recommend topical Voltaren gel can use Aleve sparingly.

## 2021-02-24 ENCOUNTER — Telehealth: Payer: Self-pay | Admitting: Family Medicine

## 2021-02-24 LAB — COMPREHENSIVE METABOLIC PANEL
ALT: 13 IU/L (ref 0–32)
AST: 18 IU/L (ref 0–40)
Albumin/Globulin Ratio: 2 (ref 1.2–2.2)
Albumin: 4.9 g/dL — ABNORMAL HIGH (ref 3.8–4.8)
Alkaline Phosphatase: 67 IU/L (ref 44–121)
BUN/Creatinine Ratio: 12 (ref 9–23)
BUN: 13 mg/dL (ref 6–24)
Bilirubin Total: 0.4 mg/dL (ref 0.0–1.2)
CO2: 25 mmol/L (ref 20–29)
Calcium: 9.3 mg/dL (ref 8.7–10.2)
Chloride: 103 mmol/L (ref 96–106)
Creatinine, Ser: 1.1 mg/dL — ABNORMAL HIGH (ref 0.57–1.00)
Globulin, Total: 2.4 g/dL (ref 1.5–4.5)
Glucose: 75 mg/dL (ref 70–99)
Potassium: 4.2 mmol/L (ref 3.5–5.2)
Sodium: 142 mmol/L (ref 134–144)
Total Protein: 7.3 g/dL (ref 6.0–8.5)
eGFR: 62 mL/min/{1.73_m2} (ref >59)

## 2021-02-24 LAB — CBC WITH DIFFERENTIAL/PLATELET
Basophils Absolute: 0.1 10*3/uL (ref 0.0–0.2)
Basos: 1 %
EOS (ABSOLUTE): 0.2 10*3/uL (ref 0.0–0.4)
Eos: 3 %
Hematocrit: 35.5 % (ref 34.0–46.6)
Hemoglobin: 11.5 g/dL (ref 11.1–15.9)
Immature Grans (Abs): 0 10*3/uL (ref 0.0–0.1)
Immature Granulocytes: 0 %
Lymphocytes Absolute: 2.8 10*3/uL (ref 0.7–3.1)
Lymphs: 59 %
MCH: 30.2 pg (ref 26.6–33.0)
MCHC: 32.4 g/dL (ref 31.5–35.7)
MCV: 93 fL (ref 79–97)
Monocytes Absolute: 0.5 10*3/uL (ref 0.1–0.9)
Monocytes: 11 %
Neutrophils Absolute: 1.2 10*3/uL — ABNORMAL LOW (ref 1.4–7.0)
Neutrophils: 26 %
Platelets: 276 10*3/uL (ref 150–450)
RBC: 3.81 x10E6/uL (ref 3.77–5.28)
RDW: 12.6 % (ref 11.7–15.4)
WBC: 4.7 10*3/uL (ref 3.4–10.8)

## 2021-02-24 NOTE — Telephone Encounter (Signed)
Called patient with results.  We discussed the findings.  For her slightly elevated creatinine, we encourage cessation of NSAIDs.  She is using some BC powder in addition to Aleve.  Discussed limiting Aleve.  She will use topical Voltaren.  Can use Tylenol for pain.  Discussed x-ray findings including arthritis on bilateral knee x-rays.  No identifiable bony lesion that correlates with left second metatarsal abnormality on exam.  Repeat exam at follow-up.  Recommended appropriate footwear.  Consider foot and ankle referral at follow-up if she has pain or this persist.  Reviewed reasons to call and return to care.

## 2021-03-09 ENCOUNTER — Other Ambulatory Visit: Payer: Self-pay

## 2021-03-09 ENCOUNTER — Ambulatory Visit (HOSPITAL_COMMUNITY)
Admission: EM | Admit: 2021-03-09 | Discharge: 2021-03-09 | Disposition: A | Discharge status: Home / Self Care | Payer: Medicaid Other | Attending: Family Medicine | Admitting: Family Medicine

## 2021-03-09 ENCOUNTER — Encounter (HOSPITAL_COMMUNITY): Payer: Self-pay

## 2021-03-09 DIAGNOSIS — M79672 Pain in left foot: Secondary | ICD-10-CM

## 2021-03-09 DIAGNOSIS — R232 Flushing: Secondary | ICD-10-CM

## 2021-03-09 NOTE — ED Provider Notes (Signed)
[facility]    CSN: [id number] Arrival date & time: 03/09/21  1146      History   Chief Complaint Chief Complaint  Patient presents with   Foot Pain    HPI Meghan Barrett is a 50 y.o. female.    Foot Pain  Here for hot flashes, began in the last week. Denies f/c/URI symptoms. She does get cold after the sweat.  She did have vomiting 12/17 after she had drunk a good bit of wine, but not since.  Has pain between her toes, and radiates up into her leg. Pt has a hard time to tell me when she first noted the bump; states she did not see it til her doctor checked her out. Xray result reviewed today, and xray negative.  Her kitty has scratched her a bunch, but has not bitten her. She has had the cat for about 6 months, and the cat does not go outside. Behavior of the cat has been normal.   Past Medical History:  Diagnosis Date   Anxiety    Asthma    Depression    Diabetes mellitus (HCC) dx'd 06/19/2013   Hernia    High cholesterol    Hx of diabetes mellitus 01/27/2020   Schizophrenia (HCC)     Patient Active Problem List   Diagnosis Date Noted   Vesicular skin lesions 11/06/2020   Chronic pain of both knees 08/26/2020   Chronic bilateral low back pain without sciatica 08/26/2020   Bacterial vaginosis 05/24/2020   Hx of diabetes mellitus 01/27/2020   Gastroesophageal reflux disease 01/27/2020   Mild intermittent asthma 12/13/2019   Hyperlipidemia 08/17/2015   Schizophrenia (HCC) 06/19/2013    Past Surgical History:  Procedure Laterality Date   APPENDECTOMY  08-06-15   CESAREAN SECTION  1991; 1999; 2002   FINGER FRACTURE SURGERY Left 2010   TUBAL LIGATION  2002    OB History   No obstetric history on file.      Home Medications    Prior to Admission medications   Medication Sig Start Date End Date Taking? Authorizing Provider  albuterol (VENTOLIN HFA) 108 (90 Base) MCG/ACT inhaler Inhale 2 puffs into the lungs every 6 (six) hours as needed for  wheezing or shortness of breath. 11/17/20   Doreene Eland, MD  cetirizine (ZYRTEC) 10 MG tablet TAKE 1 TABLET(10 MG) BY MOUTH DAILY 02/05/21   Autry-Lott, Randa Evens, DO  clotrimazole-betamethasone (LOTRISONE) cream Apply to affected area 2 times daily prn 10/06/20   Valinda Hoar, NP  diclofenac Sodium (VOLTAREN) 1 % GEL Apply to left knee and left foot for pain 02/23/21   Westley Chandler, MD  famotidine (PEPCID) 10 MG tablet Take 1 tablet (10 mg total) by mouth 2 (two) times daily. 12/24/20   Erick Alley, DO  FLUoxetine (PROZAC) 20 MG capsule Take 20 mg by mouth daily. 12/02/19   [provider]  fluticasone (FLONASE) 50 MCG/ACT nasal spray Place 1 spray into both nostrils daily. 11/17/20   Doreene Eland, MD  lidocaine (XYLOCAINE) 2 % solution Use as directed 10 mLs in the mouth or throat as needed for mouth pain. Patient not taking: Reported on 12/24/2020 04/07/20   Particia Nearing, PA-C  montelukast (SINGULAIR) 10 MG tablet Take 1 tablet (10 mg total) by mouth at bedtime. 02/23/21   Westley Chandler, MD  polyethylene glycol powder (GLYCOLAX/MIRALAX) 17 GM/SCOOP powder Take 17 g by mouth daily. 01/11/21   Autry-Lott, Randa Evens, DO  risperiDONE (RISPERDAL) 0.5  MG tablet Take 0.5 mg by mouth at bedtime. Patient not taking: Reported on 02/23/2021 12/02/19   [provider]  traZODone (DESYREL) 50 MG tablet Take by mouth. 03/06/20   [provider]  triamcinolone cream (KENALOG) 0.1 % Apply 1 application topically 2 (two) times daily. 11/20/20   Sabino Dick, DO    Family History Family History  Problem Relation Age of Onset   Alcohol abuse Mother    Drug abuse Mother    Diabetes Mother    Alcohol abuse Father    Drug abuse Father    Heart attack Father    Diabetes Father    Diabetes Sister    Early death Sister    High Cholesterol Sister    Cancer Other    Stroke Other     Social History Social History   Tobacco Use   Smoking status: Some Days     Years: 25.00    Types: Cigarettes   Smokeless tobacco: Never   Tobacco comments:    06/19/2013 "smoke ~ 5 cigarettes/month"  Substance Use Topics   Alcohol use: Yes    Alcohol/week: 1.0 standard drink    Types: 1 Cans of beer per week   Drug use: Yes    Types: "Crack" cocaine    Comment: 06/19/2013 "got off crack cocaine almost 1 yr ago"     Allergies   Penicillins   Review of Systems Review of Systems   Physical Exam Triage Vital Signs ED Triage Vitals [03/09/21 1255]  Enc Vitals Group     BP 133/85     Pulse Rate 67     Resp 16     Temp 97.9 F (36.6 C)     Temp Source Oral     SpO2 99 %     Weight      Height      Head Circumference      Peak Flow      Pain Score      Pain Loc      Pain Edu?      Excl. in GC?    No data found.  Updated Vital Signs BP 133/85 (BP Location: Right Arm)    Pulse 67    Temp 97.9 F (36.6 C) (Oral)    Resp 16    LMP 02/18/2021 (Exact Date)    SpO2 99%   Visual Acuity Right Eye Distance:   Left Eye Distance:   Bilateral Distance:    Right Eye Near:   Left Eye Near:    Bilateral Near:     Physical Exam Vitals reviewed.  Constitutional:      General: She is not in acute distress.    Appearance: She is not toxic-appearing.  HENT:     Nose: Nose normal.     Mouth/Throat:     Mouth: Mucous membranes are moist.  Eyes:     Extraocular Movements: Extraocular movements intact.     Pupils: Pupils are equal, round, and reactive to light.  Cardiovascular:     Rate and Rhythm: Normal rate and regular rhythm.     Heart sounds: No murmur heard. Pulmonary:     Effort: Pulmonary effort is normal.     Breath sounds: No wheezing or rhonchi.  Musculoskeletal:     Cervical back: Neck supple.     Comments: Firm nodule on distal 3rd metatarsal  Lymphadenopathy:     Cervical: No cervical adenopathy.  Skin:    Coloration: Skin is not jaundiced or  pale.     Comments: Has some healing scratches on her wrist.  Neurological:      General: No focal deficit present.     Mental Status: She is alert and oriented to person, place, and time.  Psychiatric:        Behavior: Behavior normal.     UC Treatments / Results  Labs (all labs ordered are listed, but only abnormal results are displayed) Labs Reviewed - No data to display  EKG   Radiology No results found.  Procedures Procedures (including critical care time)  Medications Ordered in UC Medications - No data to display  Initial Impression / Assessment and Plan / UC Course  I have reviewed the triage vital signs and the nursing notes.  Pertinent labs & imaging results that were available during my care of the patient were reviewed by me and considered in my medical decision making (see chart for details).     No rabies vaccine will be recommended, with no h/o bite, and cat is inside. Scratches are healing well. ES tylenol as needed, and she is to talk to her PCP about ?any med to help flashes, and further eval for the foot. She declined toradol one time injection Final Clinical Impressions(s) / UC Diagnoses   Final diagnoses:  Foot pain, left  Hot flashes     Discharge Instructions      Try extra strength tylenol as needed for pain.  Talk to your doctor about the hot flashes     ED Prescriptions   None    I have reviewed the PDMP during this encounter.   Zenia Resides, MD 03/09/21 [phone]

## 2021-03-09 NOTE — ED Triage Notes (Signed)
Patient presents to Urgent Care with complaints of possible abscess located between right big toe. She states her pcp found this on examination, was instructed to follow-up visit scheduled 01/04. She continues to have pain to feet. Pt also concerned with possible early menopause. She began to have hot flashes recently and has irregular periods since in her early 36's. She is also concerned with possible need for rabies vaccine she was scratched by her cat who has not had rabies vaccine.   Denies fever.

## 2021-03-09 NOTE — Discharge Instructions (Addendum)
Try extra strength tylenol as needed for pain.  Talk to your doctor about the hot flashes

## 2021-03-21 NOTE — Progress Notes (Signed)
° ° °  SUBJECTIVE:   CHIEF COMPLAINT / HPI:   Meghan Barrett is a 51 yo F who presents for follow up on the below.   Left foot pain Continues to have pain when walking and standing.  Her job requires that she stands for long periods of time.  She usually wears her [employer] sneakers to work.  She has attempted to use Tylenol arthritis which she feels is too strong.  She does endorse being told by provider to not take Aleve which was working for her.  She has not tried Voltaren gel.  PERTINENT  PMH / PSH: Bilateral knee arthritis  OBJECTIVE:   BP 126/80    Pulse 64    Ht 5\' 6"  (1.676 m)    Wt 147 lb 9.6 oz (67 kg)    SpO2 100%    BMI 23.82 kg/m   General: Appears well, no acute distress. Age appropriate. Respiratory: normal effort Extremities: Left foot exam No gross deformity, swelling, ecchymoses FROM TTP over 2-3 proximal to mid metatarsal; location of bony prominence NV intact distally. Skin: Warm and dry, no rashes noted Neuro: alert and oriented Psych: normal affect  ASSESSMENT/PLAN:   Left foot pain Continues to have pain with bearing weight and standing for long periods of time.  Tender over bony prominence located at top of the foot at 2nd-3rd metatarsal.  Reviewed foot x-ray from December which was unremarkable.  Reviewed phone note from ordering provider which suggested foot and ankle follow-up if pain persist.  Will refer to foot and ankle at this time and encourage use of Voltaren gel as well as hard soled shoes that are comfortable. - Ambulatory referral to Podiatry - diclofenac Sodium (VOLTAREN) 1 % GEL; Apply to left knee and left foot for pain  Dispense: 100 g; Refill: 1   January, DO [facility]

## 2021-03-24 ENCOUNTER — Encounter: Payer: Self-pay | Admitting: Family Medicine

## 2021-03-24 ENCOUNTER — Ambulatory Visit (INDEPENDENT_AMBULATORY_CARE_PROVIDER_SITE_OTHER): Payer: Medicaid Other | Admitting: Family Medicine

## 2021-03-24 ENCOUNTER — Other Ambulatory Visit: Payer: Self-pay

## 2021-03-24 VITALS — BP 126/80 | HR 64 | Ht 66.0 in | Wt 147.6 lb

## 2021-03-24 DIAGNOSIS — M79672 Pain in left foot: Secondary | ICD-10-CM | POA: Diagnosis not present

## 2021-03-24 DIAGNOSIS — G8929 Other chronic pain: Secondary | ICD-10-CM

## 2021-03-24 MED ORDER — DICLOFENAC SODIUM 1 % EX GEL: CUTANEOUS | 1 refills | Status: DC

## 2021-03-24 MED ORDER — CETIRIZINE HCL 10 MG PO TABS: ORAL_TABLET | ORAL | 1 refills | Status: DC

## 2021-03-24 MED ORDER — CLOTRIMAZOLE-BETAMETHASONE 1-0.05 % EX CREA: TOPICAL_CREAM | CUTANEOUS | 1 refills | Status: DC

## 2021-03-24 MED ORDER — ALBUTEROL SULFATE HFA 108 (90 BASE) MCG/ACT IN AERS: 2.0000 | INHALATION_SPRAY | Freq: Four times a day (QID) | RESPIRATORY_TRACT | 2 refills | Status: DC | PRN

## 2021-03-24 NOTE — Patient Instructions (Addendum)
Thank you coming in today.  We discussed left foot pain and reviewed your imaging.  There are no acute abnormalities of your imaging.  For foot arthritis I have prescribed Voltaren gel and sent a referral to the foot and ankle doctor call [facility] [phone] to make an appointment.   Please call [facility] to schedule your colonoscopy [phone]   Follow-up as needed.

## 2021-03-30 ENCOUNTER — Ambulatory Visit: Payer: Self-pay | Admitting: Podiatry

## 2021-03-30 ENCOUNTER — Other Ambulatory Visit: Payer: Self-pay

## 2021-04-22 ENCOUNTER — Ambulatory Visit: Payer: Medicaid Other | Admitting: Podiatry

## 2021-04-30 ENCOUNTER — Ambulatory Visit (HOSPITAL_COMMUNITY)
Admission: EM | Admit: 2021-04-30 | Discharge: 2021-04-30 | Disposition: A | Discharge status: Home / Self Care | Payer: Medicaid Other | Attending: Physician Assistant | Admitting: Physician Assistant

## 2021-04-30 ENCOUNTER — Encounter (HOSPITAL_COMMUNITY): Payer: Self-pay | Admitting: Emergency Medicine

## 2021-04-30 DIAGNOSIS — L0591 Pilonidal cyst without abscess: Secondary | ICD-10-CM | POA: Diagnosis not present

## 2021-04-30 MED ORDER — DOXYCYCLINE HYCLATE 100 MG PO CAPS: 100.0000 mg | ORAL_CAPSULE | Freq: Two times a day (BID) | ORAL | 0 refills | Status: DC

## 2021-04-30 MED ORDER — MUPIROCIN 2 % EX OINT: 1.0000 "application " | TOPICAL_OINTMENT | Freq: Every day | CUTANEOUS | 0 refills | Status: DC

## 2021-04-30 MED ORDER — LIDOCAINE-EPINEPHRINE 1 %-1:100000 IJ SOLN
INTRAMUSCULAR | Status: AC
  Filled 2021-04-30: qty 1

## 2021-04-30 NOTE — ED Triage Notes (Signed)
Pt reports a possible abscess between buttocks x 3 days. Pt states this is recurrent.

## 2021-04-30 NOTE — Discharge Instructions (Signed)
We drained your abscess today.  This will likely continue to drain some fluid and that is okay.  Keep it clean with soap and water 2 times a day and apply Bactroban ointment.  Start doxycycline 100 mg twice daily for 10 days.  You can use Tylenol and ibuprofen for pain.  Follow-up with your primary care or our clinic next week for wound recheck.  If you develop any worsening symptoms including change in the character of drainage, fever, increased pain, nausea/vomiting you should be seen immediately.

## 2021-04-30 NOTE — ED Provider Notes (Addendum)
[facility]    CSN: [id number] Arrival date & time: 04/30/21  0845      History   Chief Complaint Chief Complaint  Patient presents with   Abscess    HPI Mancel BaleFatima Berryman is a 51 y.o. female.   Patient presents today with a 3-day history of enlarging lesion at the top of her gluteal cleft.  She reports this is painful with current pain rated 3 on a 0-10 pain scale, localized to affected area, described as aching, no aggravating relieving factors identified.  She does report having lesions in this area in the past but denies history of I&D for pilonidal cyst in the past.  She denies any recent antibiotic use.  She has been applying a steroid/antifungal cream that she was previously prescribed by her PCP which provided minimal relief of symptoms.  She denies any additional medications for symptom management.  She does have a history of diabetes but reports this is well controlled with last A1c 5.6% October 2022.  She denies history of recurrent skin infections or MRSA.  She denies any recent antibiotic use.  She denies fever/nausea/vomiting or other systemic symptoms.   Past Medical History:  Diagnosis Date   Anxiety    Asthma    Depression    Diabetes mellitus (HCC) dx'd 06/19/2013   Hernia    High cholesterol    Hx of diabetes mellitus 01/27/2020   Schizophrenia [facility](HCC)     Patient Active Problem List   Diagnosis Date Noted   Vesicular skin lesions 11/06/2020   Chronic pain of both knees 08/26/2020   Chronic bilateral low back pain without sciatica 08/26/2020   Bacterial vaginosis 05/24/2020   Hx of diabetes mellitus 01/27/2020   Gastroesophageal reflux disease 01/27/2020   Mild intermittent asthma 12/13/2019   Hyperlipidemia 08/17/2015   Schizophrenia (HCC) 06/19/2013    Past Surgical History:  Procedure Laterality Date   APPENDECTOMY  08-06-15   CESAREAN SECTION  1991; 1999; 2002   FINGER FRACTURE SURGERY Left 2010   TUBAL LIGATION  2002    OB History   No  obstetric history on file.      Home Medications    Prior to Admission medications   Medication Sig Start Date End Date Taking? Authorizing Provider  doxycycline (VIBRAMYCIN) 100 MG capsule Take 1 capsule (100 mg total) by mouth 2 (two) times daily. 04/30/21  Yes Kathryne Ramella, Denny PeonErin K, PA-C  mupirocin ointment (BACTROBAN) 2 % Apply 1 application topically daily. 04/30/21  Yes Tekela Garguilo K, PA-C  albuterol (VENTOLIN HFA) 108 (90 Base) MCG/ACT inhaler Inhale 2 puffs into the lungs every 6 (six) hours as needed for wheezing or shortness of breath. 03/24/21   Autry-Lott, Randa EvensSimone, DO  cetirizine (ZYRTEC) 10 MG tablet TAKE 1 TABLET(10 MG) BY MOUTH DAILY 03/24/21   Autry-Lott, Randa EvensSimone, DO  clotrimazole-betamethasone (LOTRISONE) cream Apply to affected area 2 times daily prn 03/24/21   Autry-Lott, Randa EvensSimone, DO  diclofenac Sodium (VOLTAREN) 1 % GEL Apply to left knee and left foot for pain 03/24/21   Autry-Lott, Randa EvensSimone, DO  famotidine (PEPCID) 10 MG tablet Take 1 tablet (10 mg total) by mouth 2 (two) times daily. 12/24/20   Erick AlleyJones, Sarah, DO  FLUoxetine (PROZAC) 20 MG capsule Take 20 mg by mouth daily. 12/02/19   [provider]  fluticasone (FLONASE) 50 MCG/ACT nasal spray Place 1 spray into both nostrils daily. 11/17/20   Doreene ElandEniola, Kehinde T, MD  lidocaine (XYLOCAINE) 2 % solution Use as directed 10 mLs in the  mouth or throat as needed for mouth pain. Patient not taking: Reported on 12/24/2020 04/07/20   Particia Nearing, PA-C  montelukast (SINGULAIR) 10 MG tablet Take 1 tablet (10 mg total) by mouth at bedtime. 02/23/21   Westley Chandler, MD  polyethylene glycol powder (GLYCOLAX/MIRALAX) 17 GM/SCOOP powder Take 17 g by mouth daily. 01/11/21   Autry-Lott, Randa Evens, DO  risperiDONE (RISPERDAL) 0.5 MG tablet Take 0.5 mg by mouth at bedtime. Patient not taking: Reported on 02/23/2021 12/02/19   [provider]  traZODone (DESYREL) 50 MG tablet Take by mouth. 03/06/20   [provider]  triamcinolone  cream (KENALOG) 0.1 % Apply 1 application topically 2 (two) times daily. 11/20/20   Sabino Dick, DO    Family History Family History  Problem Relation Age of Onset   Alcohol abuse Mother    Drug abuse Mother    Diabetes Mother    Alcohol abuse Father    Drug abuse Father    Heart attack Father    Diabetes Father    Diabetes Sister    Early death Sister    High Cholesterol Sister    Cancer Other    Stroke Other     Social History Social History   Tobacco Use   Smoking status: Some Days    Years: 25.00    Types: Cigarettes   Smokeless tobacco: Never   Tobacco comments:    06/19/2013 "smoke ~ 5 cigarettes/month"  Substance Use Topics   Alcohol use: Yes    Alcohol/week: 1.0 standard drink    Types: 1 Cans of beer per week   Drug use: Yes    Types: "Crack" cocaine    Comment: 06/19/2013 "got off crack cocaine almost 1 yr ago"     Allergies   Penicillins   Review of Systems Review of Systems  Constitutional:  Negative for activity change, appetite change, fatigue and fever.  Respiratory:  Negative for cough and shortness of breath.   Cardiovascular:  Negative for chest pain.  Gastrointestinal:  Negative for abdominal pain, diarrhea, nausea and vomiting.  Skin:  Positive for color change and wound.  Neurological:  Negative for dizziness, light-headedness and headaches.    Physical Exam Triage Vital Signs ED Triage Vitals  Enc Vitals Group     BP 04/30/21 0943 100/69     Pulse Rate 04/30/21 0943 68     Resp 04/30/21 0943 16     Temp 04/30/21 0943 98 F (36.7 C)     Temp Source 04/30/21 0943 Oral     SpO2 04/30/21 0943 96 %     Weight 04/30/21 0941 147 lb 11.3 oz (67 kg)     Height 04/30/21 0941 5\' 6"  (1.676 m)     Head Circumference --      Peak Flow --      Pain Score 04/30/21 0941 0     Pain Loc --      Pain Edu? --      Excl. in GC? --    No data found.  Updated Vital Signs BP 100/69 (BP Location: Left Arm)    Pulse 68    Temp 98 F (36.7 C)  (Oral)    Resp 16    Ht 5\' 6"  (1.676 m)    Wt 147 lb 11.3 oz (67 kg)    SpO2 96%    BMI 23.84 kg/m   Visual Acuity Right Eye Distance:   Left Eye Distance:   Bilateral Distance:  Right Eye Near:   Left Eye Near:    Bilateral Near:     Physical Exam Vitals reviewed.  Constitutional:      General: She is awake. She is not in acute distress.    Appearance: Normal appearance. She is well-developed. She is not ill-appearing.     Comments: Very pleasant female appears stated age in no acute distress sitting comfortably in exam room  HENT:     Head: Normocephalic and atraumatic.  Cardiovascular:     Rate and Rhythm: Normal rate and regular rhythm.     Heart sounds: Normal heart sounds, S1 normal and S2 normal. No murmur heard. Pulmonary:     Effort: Pulmonary effort is normal.     Breath sounds: Normal breath sounds. No wheezing, rhonchi or rales.     Comments: Clear to auscultation bilaterally Skin:    Findings: Abscess present.          Comments: 2 x 1 cm noted at superior gluteal cleft with central fluctuance and surrounding erythema.  No streaking or evidence of lymphangitis.  No bleeding or drainage noted.  Psychiatric:        Behavior: Behavior is cooperative.     UC Treatments / Results  Labs (all labs ordered are listed, but only abnormal results are displayed) Labs Reviewed - No data to display  EKG   Radiology No results found.  Procedures Incision and Drainage  Date/Time: 04/30/2021 10:19 AM Performed by: Jeani Hawking, PA-C Authorized by: Jeani Hawking, PA-C   Consent:    Consent obtained:  Verbal   Consent given by:  Patient   Risks, benefits, and alternatives were discussed: yes     Risks discussed:  Bleeding, incomplete drainage and infection   Alternatives discussed:  Delayed treatment, alternative treatment and observation Universal protocol:    Procedure explained and questions answered to patient or proxy's satisfaction: yes     Patient  identity confirmed:  Verbally with patient Location:    Type:  Pilonidal cyst   Size:  2 cm x 1 cm   Location:  Anogenital   Anogenital location:  Pilonidal Pre-procedure details:    Skin preparation:  Chlorhexidine with alcohol Sedation:    Sedation type:  None Anesthesia:    Anesthesia method:  Local infiltration   Local anesthetic:  Lidocaine 1% WITH epi Procedure type:    Complexity:  Simple Procedure details:    Ultrasound guidance: no     Needle aspiration: no     Incision types:  Stab incision and single straight   Incision depth:  Dermal   Wound management:  Probed and deloculated and irrigated with saline   Drainage:  Bloody and purulent   Drainage amount:  Moderate   Wound treatment:  Wound left open   Packing materials:  None Post-procedure details:    Procedure completion:  Tolerated (including critical care time)  Medications Ordered in UC Medications - No data to display  Initial Impression / Assessment and Plan / UC Course  I have reviewed the triage vital signs and the nursing notes.  Pertinent labs & imaging results that were available during my care of the patient were reviewed by me and considered in my medical decision making (see chart for details).     I&D performed in clinic with improvement of pain symptoms.  Patient was started on doxycycline 100 mg twice daily for 10 days given penicillin allergy.  Recommended she keep area clean with soap and water and apply  Bactroban ointment.  She can use Tylenol ibuprofen for pain relief.  Recommended she rest and drink plenty of fluid.  Encouraged her to follow-up with either our clinic or primary care next week for reevaluation and wound check.  Discussed that if she has any worsening symptoms including fever, nausea, vomiting, body aches, change in character of drainage, increased pain or enlargement of lesion she should be seen immediately.  Strict return precautions given to which she expressed  understanding.  Final Clinical Impressions(s) / UC Diagnoses   Final diagnoses:  Infected pilonidal cyst     Discharge Instructions      We drained your abscess today.  This will likely continue to drain some fluid and that is okay.  Keep it clean with soap and water 2 times a day and apply Bactroban ointment.  Start doxycycline 100 mg twice daily for 10 days.  You can use Tylenol and ibuprofen for pain.  Follow-up with your primary care or our clinic next week for wound recheck.  If you develop any worsening symptoms including change in the character of drainage, fever, increased pain, nausea/vomiting you should be seen immediately.     ED Prescriptions     Medication Sig Dispense Auth. Provider   doxycycline (VIBRAMYCIN) 100 MG capsule Take 1 capsule (100 mg total) by mouth 2 (two) times daily. 20 capsule Tommaso Cavitt K, PA-C   mupirocin ointment (BACTROBAN) 2 % Apply 1 application topically daily. 22 g Riese Hellard K, PA-C      PDMP not reviewed this encounter.   Jeani Hawking, PA-C 04/30/21 1019    Fern Asmar, Noberto Retort, PA-C 04/30/21 1020

## 2021-05-06 ENCOUNTER — Ambulatory Visit: Payer: Medicaid Other | Admitting: Podiatry

## 2021-05-11 ENCOUNTER — Ambulatory Visit: Payer: Medicaid Other

## 2021-05-11 NOTE — Progress Notes (Unsigned)
° ° ° °  SUBJECTIVE:   CHIEF COMPLAINT / HPI:   Meghan Barrett is a 51 y.o. female presents for rash  Rash on buttock  Seen in urgent care 11 days ago for abscess on buttock. It was drained and she was started on doxycycline 100 mg BID for 10 days. Since then pt reports improvement in sx. Denies enlarging of abscess, fevers, tenderness, erythema.  Flowsheet Row Office Visit from 02/23/2021 in [facility]  PHQ-9 Total Score 0        Health Maintenance Due  Topic   OPHTHALMOLOGY EXAM    URINE MICROALBUMIN    COLONOSCOPY (Pts 45-49yrs Insurance coverage will need to be confirmed)    COVID-19 Vaccine (2 - Pfizer series)      PERTINENT  PMH / PSH:   OBJECTIVE:   There were no vitals taken for this visit.   General: Alert, no acute distress Cardio: Normal S1 and S2, RRR, no r/m/g Pulm: CTAB, normal work of breathing Abdomen: Bowel sounds normal. Abdomen soft and non-tender.  Extremities: No peripheral edema.  Neuro: Cranial nerves grossly intact   ASSESSMENT/PLAN:   No problem-specific Assessment & Plan notes found for this encounter.    Towanda Octave, MD PGY-3 [facility]

## 2021-05-21 ENCOUNTER — Other Ambulatory Visit: Payer: Self-pay

## 2021-05-21 ENCOUNTER — Encounter (HOSPITAL_COMMUNITY): Payer: Self-pay

## 2021-05-21 ENCOUNTER — Ambulatory Visit (HOSPITAL_COMMUNITY)
Admission: EM | Admit: 2021-05-21 | Discharge: 2021-05-21 | Disposition: A | Discharge status: Home / Self Care | Payer: Medicaid Other | Attending: Family Medicine | Admitting: Family Medicine

## 2021-05-21 DIAGNOSIS — M79672 Pain in left foot: Secondary | ICD-10-CM | POA: Diagnosis not present

## 2021-05-21 NOTE — ED Triage Notes (Signed)
Patient presents to Urgent Care with complaints of left foot pain x 2 days ago. Pt states she has a hx of arthritis. Not treating pain. Requesting a work note to be excused for 2 days.  ?

## 2021-05-21 NOTE — ED Provider Notes (Signed)
?[facility] ? ? ? ?CSN: [id number] ?Arrival date & time: 05/21/21  1417 ? ? ?  ? ?History   ?Chief Complaint ?Chief Complaint  ?Patient presents with  ? Foot Pain  ? ? ?HPI ?Meghan Barrett is a 51 y.o. female.  ? ?Patient is here for 2 days of left foot pain.  ?She gets this pain that comes/goes.  She did see her pcp, and recommended to see podiatry.  She missed the apt.  Her pain did resolve, but has come back.  ?Pain Wednesday night, woke up Thursday with pain, as well as this morning.  ?She is hoping to get a work note x 2 days to get off of her feet for this to improve.   ?She has been seen here before for this.  Xray normal.  No recent injury.  ? ?Past Medical History:  ?Diagnosis Date  ? Anxiety   ? Asthma   ? Depression   ? Diabetes mellitus ([place]) dx'd 06/19/2013  ? Hernia   ? High cholesterol   ? Hx of diabetes mellitus 01/27/2020  ? Schizophrenia ([place])   ? ? ?Patient Active Problem List  ? Diagnosis Date Noted  ? Vesicular skin lesions 11/06/2020  ? Chronic pain of both knees 08/26/2020  ? Chronic bilateral low back pain without sciatica 08/26/2020  ? Bacterial vaginosis 05/24/2020  ? Hx of diabetes mellitus 01/27/2020  ? Gastroesophageal reflux disease 01/27/2020  ? Mild intermittent asthma 12/13/2019  ? Hyperlipidemia 08/17/2015  ? Schizophrenia ([place]) 06/19/2013  ? ? ?Past Surgical History:  ?Procedure Laterality Date  ? APPENDECTOMY  08-06-15  ? [place]; 1999; 2002  ? FINGER FRACTURE SURGERY Left 2010  ? TUBAL LIGATION  2002  ? ? ?OB History   ?No obstetric history on file. ?  ? ? ? ?Home Medications   ? ?Prior to Admission medications   ?Medication Sig Start Date End Date Taking? Authorizing Provider  ?albuterol (VENTOLIN HFA) 108 (90 Base) MCG/ACT inhaler Inhale 2 puffs into the lungs every 6 (six) hours as needed for wheezing or shortness of breath. 03/24/21   Autry-Lott, Naaman Plummer, DO  ?cetirizine (ZYRTEC) 10 MG tablet TAKE 1 TABLET(10 MG) BY MOUTH DAILY 03/24/21   Autry-Lott, Naaman Plummer, DO   ?clotrimazole-betamethasone (LOTRISONE) cream Apply to affected area 2 times daily prn 03/24/21   Autry-Lott, Naaman Plummer, DO  ?diclofenac Sodium (VOLTAREN) 1 % GEL Apply to left knee and left foot for pain 03/24/21   Autry-Lott, Naaman Plummer, DO  ?doxycycline (VIBRAMYCIN) 100 MG capsule Take 1 capsule (100 mg total) by mouth 2 (two) times daily. 04/30/21   Raspet, Derry Skill, PA-C  ?famotidine (PEPCID) 10 MG tablet Take 1 tablet (10 mg total) by mouth 2 (two) times daily. 12/24/20   Precious Gilding, DO  ?FLUoxetine (PROZAC) 20 MG capsule Take 20 mg by mouth daily. 12/02/19   [provider]  ?fluticasone (FLONASE) 50 MCG/ACT nasal spray Place 1 spray into both nostrils daily. 11/17/20   Kinnie Feil, MD  ?lidocaine (XYLOCAINE) 2 % solution Use as directed 10 mLs in the mouth or throat as needed for mouth pain. ?Patient not taking: Reported on 12/24/2020 04/07/20   Volney American, PA-C  ?montelukast (SINGULAIR) 10 MG tablet Take 1 tablet (10 mg total) by mouth at bedtime. 02/23/21   Martyn Malay, MD  ?mupirocin ointment (BACTROBAN) 2 % Apply 1 application topically daily. 04/30/21   Raspet, Junie Panning K, PA-C  ?polyethylene glycol powder (GLYCOLAX/MIRALAX) 17 GM/SCOOP powder Take 17 g  by mouth daily. 01/11/21   Autry-Lott, Naaman Plummer, DO  ?risperiDONE (RISPERDAL) 0.5 MG tablet Take 0.5 mg by mouth at bedtime. ?Patient not taking: Reported on 02/23/2021 12/02/19   [provider]  ?traZODone (DESYREL) 50 MG tablet Take by mouth. 03/06/20   [provider]  ?triamcinolone cream (KENALOG) 0.1 % Apply 1 application topically 2 (two) times daily. 11/20/20   Sharion Settler, DO  ? ? ?Family History ?Family History  ?Problem Relation Age of Onset  ? Alcohol abuse Mother   ? Drug abuse Mother   ? Diabetes Mother   ? Alcohol abuse Father   ? Drug abuse Father   ? Heart attack Father   ? Diabetes Father   ? Diabetes Sister   ? Early death Sister   ? High Cholesterol Sister   ? Cancer Other   ? Stroke Other   ? ? ?Social  History ?Social History  ? ?Tobacco Use  ? Smoking status: Some Days  ?  Years: 25.00  ?  Types: Cigarettes  ? Smokeless tobacco: Never  ? Tobacco comments:  ?  06/19/2013 "smoke ~ 5 cigarettes/month"  ?Substance Use Topics  ? Alcohol use: Yes  ?  Alcohol/week: 1.0 standard drink  ?  Types: 1 Cans of beer per week  ? Drug use: Yes  ?  Types: "Crack" cocaine  ?  Comment: 06/19/2013 "got off crack cocaine almost 1 yr ago"  ? ? ? ?Allergies   ?Penicillins ? ? ?Review of Systems ?Review of Systems  ?Constitutional: Negative.   ?HENT: Negative.    ?Respiratory: Negative.    ?Cardiovascular: Negative.   ?Gastrointestinal: Negative.   ? ? ?Physical Exam ?Triage Vital Signs ?ED Triage Vitals [05/21/21 1434]  ?Enc Vitals Group  ?   BP 119/67  ?   Pulse Rate 65  ?   Resp 16  ?   Temp 97.6 ?F (36.4 ?C)  ?   Temp Source Oral  ?   SpO2 97 %  ?   Weight   ?   Height   ?   Head Circumference   ?   Peak Flow   ?   Pain Score 10  ?   Pain Loc   ?   Pain Edu?   ?   Excl. in [place]?   ? ?No data found. ? ?Updated Vital Signs ?BP 119/67 (BP Location: Left Arm)   Pulse 65   Temp 97.6 ?F (36.4 ?C) (Oral)   Resp 16   LMP 05/21/2021   SpO2 97%  ? ?Visual Acuity ?Right Eye Distance:   ?Left Eye Distance:   ?Bilateral Distance:   ? ?Right Eye Near:   ?Left Eye Near:    ?Bilateral Near:    ? ?Physical Exam ?Constitutional:   ?   Appearance: Normal appearance.  ?Feet:  ?   Comments: No swelling or deformity to the left foot;  she c/o a "bump" at the foot between the 2nd and 3rd metatarsal;  this area is tender for her;  also TTP along the lateral aspect of the left foot and into the lateral ankle;  full rom without pain or limitation ?Neurological:  ?   Mental Status: She is alert.  ? ? ? ?UC Treatments / Results  ?Labs ?(all labs ordered are listed, but only abnormal results are displayed) ?Labs Reviewed - No data to display ? ?EKG ? ? ?Radiology ?No results found. ? ?Procedures ?Procedures (including critical care time) ? ?Medications Ordered  in UC ?  Medications - No data to display ? ?Initial Impression / Assessment and Plan / UC Course  ?I have reviewed the triage vital signs and the nursing notes. ? ?Pertinent labs & imaging results that were available during my care of the patient were reviewed by me and considered in my medical decision making (see chart for details). ? ?Patient seen for acute on chronic foot pain.  She is here today primarily for a note for work.  No swelling and no recent injury to the foot.  A work note was done today.  She will use tylenol/motrin for pain. Follow up with her pcp ?  ?Final Clinical Impressions(s) / UC Diagnoses  ? ?Final diagnoses:  ?Foot pain, left  ? ? ? ?Discharge Instructions   ? ?  ?You were seen today for foot pain, which is a chronic issue for you that comes/goes.  I have given you off of work until Monday. I recommend you get off of your feet, rest, elevated, and ice the foot.  You may take tylenol or motrin for pain as well.  Please follow up with your primary care provider.  ? ? ? ?ED Prescriptions   ?None ?  ? ?PDMP not reviewed this encounter. ?  ?Rondel Oh, MD ?05/21/21 1452 ? ?

## 2021-05-21 NOTE — Discharge Instructions (Signed)
You were seen today for foot pain, which is a chronic issue for you that comes/goes.  I have given you off of work until Monday. I recommend you get off of your feet, rest, elevated, and ice the foot.  You may take tylenol or motrin for pain as well.  Please follow up with your primary care provider.  ?

## 2021-05-31 ENCOUNTER — Other Ambulatory Visit: Payer: Self-pay

## 2021-05-31 ENCOUNTER — Ambulatory Visit (INDEPENDENT_AMBULATORY_CARE_PROVIDER_SITE_OTHER): Payer: Medicaid Other

## 2021-05-31 ENCOUNTER — Encounter: Payer: Self-pay | Admitting: Podiatry

## 2021-05-31 ENCOUNTER — Ambulatory Visit (INDEPENDENT_AMBULATORY_CARE_PROVIDER_SITE_OTHER): Payer: Medicaid Other | Admitting: Podiatry

## 2021-05-31 ENCOUNTER — Telehealth: Payer: Self-pay | Admitting: Podiatry

## 2021-05-31 DIAGNOSIS — M7752 Other enthesopathy of left foot: Secondary | ICD-10-CM

## 2021-05-31 DIAGNOSIS — M778 Other enthesopathies, not elsewhere classified: Secondary | ICD-10-CM

## 2021-05-31 DIAGNOSIS — M7989 Other specified soft tissue disorders: Secondary | ICD-10-CM

## 2021-05-31 MED ORDER — DICLOFENAC SODIUM 1 % EX GEL: 2.0000 g | Freq: Four times a day (QID) | CUTANEOUS | 2 refills | Status: DC

## 2021-05-31 NOTE — Telephone Encounter (Signed)
Patient called and stated she was suppose to receive some type of medication to her pharmacy. ? ?She was calling to follow up ?

## 2021-05-31 NOTE — Telephone Encounter (Signed)
Pt called and stated she forgot to get a note stating she was here today, could she just come in for a note? ?

## 2021-05-31 NOTE — Patient Instructions (Addendum)
Plantar Fasciitis (Heel Spur Syndrome) °with Rehab °The plantar fascia is a fibrous, ligament-like, soft-tissue structure that spans the bottom of the foot. Plantar fasciitis is a condition that causes pain in the foot due to inflammation of the tissue. °SYMPTOMS  °Pain and tenderness on the underneath side of the foot. °Pain that worsens with standing or walking. °CAUSES  °Plantar fasciitis is caused by irritation and injury to the plantar fascia on the underneath side of the foot. Common mechanisms of injury include: °Direct trauma to bottom of the foot. °Damage to a small nerve that runs under the foot where the main fascia attaches to the heel bone. °Stress placed on the plantar fascia due to bone spurs. °RISK INCREASES WITH:  °Activities that place stress on the plantar fascia (running, jumping, pivoting, or cutting). °Poor strength and flexibility. °Improperly fitted shoes. °Tight calf muscles. °Flat feet. °Failure to warm-up properly before activity. °Obesity. °PREVENTION °Warm up and stretch properly before activity. °Allow for adequate recovery between workouts. °Maintain physical fitness: °Strength, flexibility, and endurance. °Cardiovascular fitness. °Maintain a health body weight. °Avoid stress on the plantar fascia. °Wear properly fitted shoes, including arch supports for individuals who have flat feet. ° °PROGNOSIS  °If treated properly, then the symptoms of plantar fasciitis usually resolve without surgery. However, occasionally surgery is necessary. ° °RELATED COMPLICATIONS  °Recurrent symptoms that may result in a chronic condition. °Problems of the lower back that are caused by compensating for the injury, such as limping. °Pain or weakness of the foot during push-off following surgery. °Chronic inflammation, scarring, and partial or complete fascia tear, occurring more often from repeated injections. ° °TREATMENT  °Treatment initially involves the use of ice and medication to help reduce pain and  inflammation. The use of strengthening and stretching exercises may help reduce pain with activity, especially stretches of the Achilles tendon. These exercises may be performed at home or with a therapist. Your caregiver may recommend that you use heel cups of arch supports to help reduce stress on the plantar fascia. Occasionally, corticosteroid injections are given to reduce inflammation. If symptoms persist for greater than 6 months despite non-surgical (conservative), then surgery may be recommended.  ° °MEDICATION  °If pain medication is necessary, then nonsteroidal anti-inflammatory medications, such as aspirin and ibuprofen, or other minor pain relievers, such as acetaminophen, are often recommended. °Do not take pain medication within 7 days before surgery. °Prescription pain relievers may be given if deemed necessary by your caregiver. Use only as directed and only as much as you need. °Corticosteroid injections may be given by your caregiver. These injections should be reserved for the most serious cases, because they may only be given a certain number of times. ° °HEAT AND COLD °Cold treatment (icing) relieves pain and reduces inflammation. Cold treatment should be applied for 10 to 15 minutes every 2 to 3 hours for inflammation and pain and immediately after any activity that aggravates your symptoms. Use ice packs or massage the area with a piece of ice (ice massage). °Heat treatment may be used prior to performing the stretching and strengthening activities prescribed by your caregiver, physical therapist, or athletic trainer. Use a heat pack or soak the injury in warm water. ° °SEEK IMMEDIATE MEDICAL CARE IF: °Treatment seems to offer no benefit, or the condition worsens. °Any medications produce adverse side effects. ° °EXERCISES- °RANGE OF MOTION (ROM) AND STRETCHING EXERCISES - Plantar Fasciitis (Heel Spur Syndrome) °These exercises may help you when beginning to rehabilitate your injury. Your    symptoms may resolve with or without further involvement from your physician, physical therapist or athletic trainer. While completing these exercises, remember:  °Restoring tissue flexibility helps normal motion to return to the joints. This allows healthier, less painful movement and activity. °An effective stretch should be held for at least 30 seconds. °A stretch should never be painful. You should only feel a gentle lengthening or release in the stretched tissue. ° °RANGE OF MOTION - Toe Extension, Flexion °Sit with your right / left leg crossed over your opposite knee. °Grasp your toes and gently pull them back toward the top of your foot. You should feel a stretch on the bottom of your toes and/or foot. °Hold this stretch for 10 seconds. °Now, gently pull your toes toward the bottom of your foot. You should feel a stretch on the top of your toes and or foot. °Hold this stretch for 10 seconds. °Repeat  times. Complete this stretch 3 times per day.  ° °RANGE OF MOTION - Ankle Dorsiflexion, Active Assisted °Remove shoes and sit on a chair that is preferably not on a carpeted surface. °Place right / left foot under knee. Extend your opposite leg for support. °Keeping your heel down, slide your right / left foot back toward the chair until you feel a stretch at your ankle or calf. If you do not feel a stretch, slide your bottom forward to the edge of the chair, while still keeping your heel down. °Hold this stretch for 10 seconds. °Repeat 3 times. Complete this stretch 2 times per day.  ° °STRETCH  Gastroc, Standing °Place hands on wall. °Extend right / left leg, keeping the front knee somewhat bent. °Slightly point your toes inward on your back foot. °Keeping your right / left heel on the floor and your knee straight, shift your weight toward the wall, not allowing your back to arch. °You should feel a gentle stretch in the right / left calf. Hold this position for 10 seconds. °Repeat 3 times. Complete this  stretch 2 times per day. ° °STRETCH  Soleus, Standing °Place hands on wall. °Extend right / left leg, keeping the other knee somewhat bent. °Slightly point your toes inward on your back foot. °Keep your right / left heel on the floor, bend your back knee, and slightly shift your weight over the back leg so that you feel a gentle stretch deep in your back calf. °Hold this position for 10 seconds. °Repeat 3 times. Complete this stretch 2 times per day. ° °STRETCH  Gastrocsoleus, Standing  °Note: This exercise can place a lot of stress on your foot and ankle. Please complete this exercise only if specifically instructed by your caregiver.  °Place the ball of your right / left foot on a step, keeping your other foot firmly on the same step. °Hold on to the wall or a rail for balance. °Slowly lift your other foot, allowing your body weight to press your heel down over the edge of the step. °You should feel a stretch in your right / left calf. °Hold this position for 10 seconds. °Repeat this exercise with a slight bend in your right / left knee. °Repeat 3 times. Complete this stretch 2 times per day.  ° °STRENGTHENING EXERCISES - Plantar Fasciitis (Heel Spur Syndrome)  °These exercises may help you when beginning to rehabilitate your injury. They may resolve your symptoms with or without further involvement from your physician, physical therapist or athletic trainer. While completing these exercises, remember:  °Muscles can   gain both the endurance and the strength needed for everyday activities through controlled exercises. °Complete these exercises as instructed by your physician, physical therapist or athletic trainer. Progress the resistance and repetitions only as guided. ° °STRENGTH - Towel Curls °Sit in a chair positioned on a non-carpeted surface. °Place your foot on a towel, keeping your heel on the floor. °Pull the towel toward your heel by only curling your toes. Keep your heel on the floor. °Repeat 3 times.  Complete this exercise 2 times per day. ° °STRENGTH - Ankle Inversion °Secure one end of a rubber exercise band/tubing to a fixed object (table, pole). Loop the other end around your foot just before your toes. °Place your fists between your knees. This will focus your strengthening at your ankle. °Slowly, pull your big toe up and in, making sure the band/tubing is positioned to resist the entire motion. °Hold this position for 10 seconds. °Have your muscles resist the band/tubing as it slowly pulls your foot back to the starting position. °Repeat 3 times. Complete this exercises 2 times per day.  °Document Released: 03/07/2005 Document Revised: 05/30/2011 Document Reviewed: 06/19/2008 °[employer]® Patient Information ©2014 [employer]. ° °

## 2021-06-02 NOTE — Progress Notes (Signed)
Subjective:  ? ?Patient ID: Meghan Barrett, female   DOB: 51 y.o.   MRN: [id number]  ? ?HPI ?51 year old female presents the office today for concerns of left foot pain.  She is concerned September 2022 she has noticed a knot at the top of the foot at the base of the second toe between the second and third toes.  She said the area fluctuates in size.  She also gets discomfort on the lateral aspect of her foot.  She is previously seen her primary care physician and x-rays were performed as well referred for further evaluation.  She does stand at work working 16 hours a day.  She gets pain mostly after work.  After going barefoot over in the mornings when she first gets up.  No injuries that she reports. ? ? ?Review of Systems  ?All other systems reviewed and are negative. ? ?Past Medical History:  ?Diagnosis Date  ? Anxiety   ? Asthma   ? Depression   ? Diabetes mellitus ([facility]) dx'd 06/19/2013  ? Hernia   ? High cholesterol   ? Hx of diabetes mellitus 01/27/2020  ? Schizophrenia ([facility])   ? ? ?Past Surgical History:  ?Procedure Laterality Date  ? APPENDECTOMY  08-06-15  ? CESAREAN SECTION  1991; 1999; 2002  ? FINGER FRACTURE SURGERY Left 2010  ? TUBAL LIGATION  2002  ? ? ? ?Current Outpatient Medications:  ?  diclofenac Sodium (VOLTAREN) 1 % GEL, Apply 2 g topically 4 (four) times daily. Rub into affected area of foot 2 to 4 times daily, Disp: 100 g, Rfl: 2 ?  albuterol (VENTOLIN HFA) 108 (90 Base) MCG/ACT inhaler, Inhale 2 puffs into the lungs every 6 (six) hours as needed for wheezing or shortness of breath., Disp: 8 g, Rfl: 2 ?  cetirizine (ZYRTEC) 10 MG tablet, TAKE 1 TABLET(10 MG) BY MOUTH DAILY, Disp: 30 tablet, Rfl: 1 ?  clotrimazole-betamethasone (LOTRISONE) cream, Apply to affected area 2 times daily prn, Disp: 15 g, Rfl: 1 ?  diclofenac Sodium (VOLTAREN) 1 % GEL, Apply to left knee and left foot for pain, Disp: 100 g, Rfl: 1 ?  doxycycline (VIBRAMYCIN) 100 MG capsule, Take 1 capsule (100 mg total) by mouth 2 (two)  times daily., Disp: 20 capsule, Rfl: 0 ?  famotidine (PEPCID) 10 MG tablet, Take 1 tablet (10 mg total) by mouth 2 (two) times daily., Disp: 30 tablet, Rfl: 1 ?  FLUoxetine (PROZAC) 20 MG capsule, Take 20 mg by mouth daily., Disp: , Rfl:  ?  fluticasone (FLONASE) 50 MCG/ACT nasal spray, Place 1 spray into both nostrils daily., Disp: 16 g, Rfl: 1 ?  lidocaine (XYLOCAINE) 2 % solution, Use as directed 10 mLs in the mouth or throat as needed for mouth pain. (Patient not taking: Reported on 12/24/2020), Disp: 100 mL, Rfl: 0 ?  montelukast (SINGULAIR) 10 MG tablet, Take 1 tablet (10 mg total) by mouth at bedtime., Disp: 30 tablet, Rfl: 1 ?  mupirocin ointment (BACTROBAN) 2 %, Apply 1 application topically daily., Disp: 22 g, Rfl: 0 ?  polyethylene glycol powder (GLYCOLAX/MIRALAX) 17 GM/SCOOP powder, Take 17 g by mouth daily., Disp: 225 g, Rfl: 1 ?  risperiDONE (RISPERDAL) 0.5 MG tablet, Take 0.5 mg by mouth at bedtime. (Patient not taking: Reported on 02/23/2021), Disp: , Rfl:  ?  traZODone (DESYREL) 50 MG tablet, Take by mouth., Disp: , Rfl:  ?  triamcinolone cream (KENALOG) 0.1 %, Apply 1 application topically 2 (two) times daily., Disp: 30 g,  Rfl: 0 ? ?Allergies  ?Allergen Reactions  ? Penicillins Anaphylaxis  ? ? ? ? ?   ?Objective:  ?Physical Exam  ?General: AAO x3, NAD ? ?Dermatological: Skin is warm, dry and supple bilateral.  There are no open sores, no preulcerative lesions, no rash or signs of infection present. ? ?Vascular: Dorsalis Pedis artery and Posterior Tibial artery pedal pulses are 2/4 bilateral with immedate capillary fill time. There is no pain with calf compression, swelling, warmth, erythema.  ? ?Neruologic: Grossly intact via light touch bilateral. Negative tinel sign.  ? ?Musculoskeletal: Mild tenderness along the dorsal second MPJ with small palpable nodules present.  Also mild discomfort on the course of lateral aspect of the foot on the course of peroneal tendon distally along the insertion.   Clinically the tendon appears to be intact.  Muscular strength 5/5 in all groups tested bilateral. ? ?Gait: Unassisted, Nonantalgic.  ? ? ?   ?Assessment:  ? ?Nodule, soft tissue mass left foot, tendinitis ? ?   ?Plan:  ?-Treatment options discussed including all alternatives, risks, and complications ?-Etiology of symptoms were discussed ?-X-rays were obtained and reviewed with the patient.  No evidence of acute fracture, stress fracture calcifications. ?-As the symptoms of been ongoing for 6 months I recommended MRI of the left foot to evaluate the area of the mass. ?-Discussed shoe modifications, stretching, icing on a regular basis as well. ?-Voltaren gel. ? ? ?Vivi Barrack DPM ? ?   ? ?

## 2021-06-20 ENCOUNTER — Other Ambulatory Visit: Payer: Medicaid Other

## 2021-06-20 ENCOUNTER — Inpatient Hospital Stay: Admission: RE | Admit: 2021-06-20 | Payer: Medicaid Other | Source: Ambulatory Visit

## 2021-06-22 ENCOUNTER — Other Ambulatory Visit: Payer: Self-pay | Admitting: Family Medicine

## 2021-06-22 ENCOUNTER — Other Ambulatory Visit: Payer: Medicaid Other

## 2021-06-23 ENCOUNTER — Inpatient Hospital Stay: Admission: RE | Admit: 2021-06-23 | Payer: Medicaid Other | Source: Ambulatory Visit

## 2021-07-02 ENCOUNTER — Ambulatory Visit (HOSPITAL_COMMUNITY)
Admission: EM | Admit: 2021-07-02 | Discharge: 2021-07-02 | Disposition: A | Discharge status: Home / Self Care | Payer: Medicaid Other | Attending: Family Medicine | Admitting: Family Medicine

## 2021-07-02 ENCOUNTER — Encounter (HOSPITAL_COMMUNITY): Payer: Self-pay

## 2021-07-02 DIAGNOSIS — R21 Rash and other nonspecific skin eruption: Secondary | ICD-10-CM

## 2021-07-02 DIAGNOSIS — J4520 Mild intermittent asthma, uncomplicated: Secondary | ICD-10-CM | POA: Diagnosis not present

## 2021-07-02 DIAGNOSIS — M25579 Pain in unspecified ankle and joints of unspecified foot: Secondary | ICD-10-CM

## 2021-07-02 DIAGNOSIS — J309 Allergic rhinitis, unspecified: Secondary | ICD-10-CM

## 2021-07-02 MED ORDER — METHYLPREDNISOLONE 4 MG PO TBPK: ORAL_TABLET | ORAL | 0 refills | Status: DC

## 2021-07-02 MED ORDER — VALACYCLOVIR HCL 500 MG PO TABS: 500.0000 mg | ORAL_TABLET | Freq: Three times a day (TID) | ORAL | 0 refills | Status: AC

## 2021-07-02 MED ORDER — CLOTRIMAZOLE-BETAMETHASONE 1-0.05 % EX CREA: TOPICAL_CREAM | CUTANEOUS | 1 refills | Status: DC

## 2021-07-02 NOTE — ED Triage Notes (Signed)
Pt presents with c/o allergies, asthma, foot pain and a "bump on her butt that came back". Pt states she did not go to work because of her foot.  ?

## 2021-07-02 NOTE — Discharge Instructions (Signed)
You were seen today for various reasons.  ?I have sent out a prednisone pack to help with your asthma and allergies.  Please take this x 5 days.  ?I have sent out an oral antiviral and cream for your rash.  Please use this x 5 days as well.  ?Follow up with your primary care provider if you continue to have issues or not improving.  ? ?

## 2021-07-02 NOTE — ED Provider Notes (Signed)
?[place] ? ? ? ?CSN: [id number] ?Arrival date & time: 07/02/21  1114 ? ? ?  ? ?History   ?Chief Complaint ?Chief Complaint  ?Patient presents with  ? Allergies  ? Cough  ? Asthma  ? Foot Pain  ? ? ?HPI ?Meghan Barrett is a 51 y.o. female.  ? ?Patient is here for several issues.  ?She has a rash/bump on her buttocks.  In the past thought it was herpes, but testing was negative.  Given medication that didn't help much.  It went away with cream, but then came back.  The rash itches. Has been there x 2 days or so.  ?She has foot pain.  She is getting an MRI on Monday.  She couldn't go to work.  She needs a note.  ?Her asthma and allergies have acted up.  Having runny nose, cough.  She takes meds daily, and nasal spray, and inhalers without much help.   ? ?Past Medical History:  ?Diagnosis Date  ? Anxiety   ? Asthma   ? Depression   ? Diabetes mellitus ([place]) dx'd 06/19/2013  ? Hernia   ? High cholesterol   ? Hx of diabetes mellitus 01/27/2020  ? Schizophrenia ([place])   ? ? ?Patient Active Problem List  ? Diagnosis Date Noted  ? Vesicular skin lesions 11/06/2020  ? Chronic pain of both knees 08/26/2020  ? Chronic bilateral low back pain without sciatica 08/26/2020  ? Bacterial vaginosis 05/24/2020  ? Hx of diabetes mellitus 01/27/2020  ? Gastroesophageal reflux disease 01/27/2020  ? Mild intermittent asthma 12/13/2019  ? Hyperlipidemia 08/17/2015  ? Schizophrenia ([place]) 06/19/2013  ? ? ?Past Surgical History:  ?Procedure Laterality Date  ? APPENDECTOMY  08-06-15  ? [place]; 1999; 2002  ? FINGER FRACTURE SURGERY Left 2010  ? TUBAL LIGATION  2002  ? ? ?OB History   ?No obstetric history on file. ?  ? ? ? ?Home Medications   ? ?Prior to Admission medications   ?Medication Sig Start Date End Date Taking? Authorizing Provider  ?albuterol (VENTOLIN HFA) 108 (90 Base) MCG/ACT inhaler Inhale 2 puffs into the lungs every 6 (six) hours as needed for wheezing or shortness of breath. 03/24/21   Autry-Lott, Naaman Plummer, DO   ?cetirizine (ZYRTEC) 10 MG tablet TAKE 1 TABLET(10 MG) BY MOUTH DAILY 06/22/21   Autry-Lott, Naaman Plummer, DO  ?clotrimazole-betamethasone (LOTRISONE) cream Apply to affected area 2 times daily prn 03/24/21   Autry-Lott, Naaman Plummer, DO  ?diclofenac Sodium (VOLTAREN) 1 % GEL Apply to left knee and left foot for pain 03/24/21   Autry-Lott, Naaman Plummer, DO  ?diclofenac Sodium (VOLTAREN) 1 % GEL Apply 2 g topically 4 (four) times daily. Rub into affected area of foot 2 to 4 times daily 05/31/21   Trula Slade, DPM  ?doxycycline (VIBRAMYCIN) 100 MG capsule Take 1 capsule (100 mg total) by mouth 2 (two) times daily. ?Patient not taking: Reported on 07/02/2021 04/30/21   Raspet, Derry Skill, PA-C  ?famotidine (PEPCID) 10 MG tablet Take 1 tablet (10 mg total) by mouth 2 (two) times daily. 12/24/20   Precious Gilding, DO  ?FLUoxetine (PROZAC) 20 MG capsule Take 20 mg by mouth daily. 12/02/19   [provider]  ?fluticasone (FLONASE) 50 MCG/ACT nasal spray Place 1 spray into both nostrils daily. 11/17/20   Kinnie Feil, MD  ?lidocaine (XYLOCAINE) 2 % solution Use as directed 10 mLs in the mouth or throat as needed for mouth pain. ?Patient not taking: Reported on 12/24/2020  04/07/20   Volney American, PA-C  ?montelukast (SINGULAIR) 10 MG tablet Take 1 tablet (10 mg total) by mouth at bedtime. 02/23/21   Martyn Malay, MD  ?mupirocin ointment (BACTROBAN) 2 % Apply 1 application topically daily. 04/30/21   Raspet, Derry Skill, PA-C  ?polyethylene glycol powder (GLYCOLAX/MIRALAX) 17 GM/SCOOP powder Take 17 g by mouth daily. 01/11/21   Autry-Lott, Naaman Plummer, DO  ?risperiDONE (RISPERDAL) 0.5 MG tablet Take 0.5 mg by mouth at bedtime. ?Patient not taking: Reported on 02/23/2021 12/02/19   [provider]  ?traZODone (DESYREL) 50 MG tablet Take by mouth. 03/06/20   [provider]  ?triamcinolone cream (KENALOG) 0.1 % Apply 1 application topically 2 (two) times daily. 11/20/20   Sharion Settler, DO  ? ? ?Family History ?Family  History  ?Problem Relation Age of Onset  ? Alcohol abuse Mother   ? Drug abuse Mother   ? Diabetes Mother   ? Alcohol abuse Father   ? Drug abuse Father   ? Heart attack Father   ? Diabetes Father   ? Diabetes Sister   ? Early death Sister   ? High Cholesterol Sister   ? Cancer Other   ? Stroke Other   ? ? ?Social History ?Social History  ? ?Tobacco Use  ? Smoking status: Some Days  ?  Years: 25.00  ?  Types: Cigarettes  ? Smokeless tobacco: Never  ? Tobacco comments:  ?  06/19/2013 "smoke ~ 5 cigarettes/month"  ?Substance Use Topics  ? Alcohol use: Yes  ?  Alcohol/week: 1.0 standard drink  ?  Types: 1 Cans of beer per week  ? Drug use: Yes  ?  Types: "Crack" cocaine  ?  Comment: 06/19/2013 "got off crack cocaine almost 1 yr ago"  ? ? ? ?Allergies   ?Penicillins ? ? ?Review of Systems ?Review of Systems  ?Constitutional: Negative.   ?HENT:  Positive for congestion and rhinorrhea.   ?Respiratory:  Positive for cough and wheezing.   ?Cardiovascular: Negative.   ?Gastrointestinal: Negative.   ?Genitourinary: Negative.   ?Musculoskeletal: Negative.   ?Skin:  Positive for rash.  ? ? ?Physical Exam ?Triage Vital Signs ?ED Triage Vitals  ?Enc Vitals Group  ?   BP 07/02/21 1220 119/76  ?   Pulse Rate 07/02/21 1220 88  ?   Resp 07/02/21 1220 14  ?   Temp 07/02/21 1220 98.8 ?F (37.1 ?C)  ?   Temp Source 07/02/21 1220 Oral  ?   SpO2 07/02/21 1220 99 %  ?   Weight --   ?   Height --   ?   Head Circumference --   ?   Peak Flow --   ?   Pain Score 07/02/21 1222 10  ?   Pain Loc --   ?   Pain Edu? --   ?   Excl. in [place]? --   ? ?No data found. ? ?Updated Vital Signs ?BP 119/76 (BP Location: Left Arm)   Pulse 88   Temp 98.8 ?F (37.1 ?C) (Oral)   Resp 14   SpO2 99%  ? ?Visual Acuity ?Right Eye Distance:   ?Left Eye Distance:   ?Bilateral Distance:   ? ?Right Eye Near:   ?Left Eye Near:    ?Bilateral Near:    ? ?Physical Exam ?Constitutional:   ?   Appearance: Normal appearance.  ?HENT:  ?   Head: Normocephalic and atraumatic.  ?    Nose: Nose normal. No congestion or rhinorrhea.  ?  Cardiovascular:  ?   Rate and Rhythm: Normal rate and regular rhythm.  ?Pulmonary:  ?   Breath sounds: Normal breath sounds.  ?Musculoskeletal:     ?   General: Normal range of motion.  ?   Cervical back: Normal range of motion and neck supple. No tenderness.  ?Skin: ?   General: Skin is warm.  ?   Findings: Rash present.  ?   Comments: At the right medial/upper buttocks near the gluteal cleft is a small cluster of slightly raised blisters.  Not painful;  no erythematous base noted;   ?Neurological:  ?   Mental Status: She is alert.  ? ? ? ?UC Treatments / Results  ?Labs ?(all labs ordered are listed, but only abnormal results are displayed) ?Labs Reviewed - No data to display ? ?EKG ? ? ?Radiology ?No results found. ? ?Procedures ?Procedures (including critical care time) ? ?Medications Ordered in UC ?Medications - No data to display ? ?Initial Impression / Assessment and Plan / UC Course  ?I have reviewed the triage vital signs and the nursing notes. ? ?Pertinent labs & imaging results that were available during my care of the patient were reviewed by me and considered in my medical decision making (see chart for details). ? ?Patient was seen for asthma/allergy symptoms.  Medrol dose pack given as no signs of bacterial infection.  ?Her rash does appear herpetic to me.  I will send out valtrex and refill lotrisone as this has been helpful to her in the past.  ?Advised to follow up with her pcp for further care if needed.   ? ?Final Clinical Impressions(s) / UC Diagnoses  ? ?Final diagnoses:  ?Allergic rhinitis, unspecified seasonality, unspecified trigger  ?Mild intermittent asthma, unspecified whether complicated  ?Rash and nonspecific skin eruption  ?Pain in joint involving ankle and foot, unspecified laterality  ? ? ? ?Discharge Instructions   ? ?  ?You were seen today for various reasons.  ?I have sent out a prednisone pack to help with your asthma and  allergies.  Please take this x 5 days.  ?I have sent out an oral antiviral and cream for your rash.  Please use this x 5 days as well.  ?Follow up with your primary care provider if you continue to have issues or not impro

## 2021-07-05 ENCOUNTER — Ambulatory Visit
Admission: RE | Admit: 2021-07-05 | Discharge: 2021-07-05 | Disposition: A | Discharge status: Home / Self Care | Payer: Medicaid Other | Source: Ambulatory Visit | Attending: Podiatry | Admitting: Podiatry

## 2021-07-05 DIAGNOSIS — M7989 Other specified soft tissue disorders: Secondary | ICD-10-CM

## 2021-07-14 ENCOUNTER — Ambulatory Visit: Payer: Medicaid Other | Admitting: Family Medicine

## 2021-07-26 ENCOUNTER — Encounter (HOSPITAL_COMMUNITY): Payer: Self-pay | Admitting: Emergency Medicine

## 2021-07-26 ENCOUNTER — Ambulatory Visit (HOSPITAL_COMMUNITY)
Admission: EM | Admit: 2021-07-26 | Discharge: 2021-07-26 | Disposition: A | Discharge status: Home / Self Care | Payer: Medicaid Other | Attending: Internal Medicine | Admitting: Internal Medicine

## 2021-07-26 DIAGNOSIS — S93602D Unspecified sprain of left foot, subsequent encounter: Secondary | ICD-10-CM

## 2021-07-26 NOTE — ED Triage Notes (Addendum)
Pt is present today with left foot swelling. Pt noticed the swelling and pain yesterday. Pt states that she has an appt to have Cortizone shots in her foot but due to working two jobs she has not been able to go. Pt denies any injury  ?

## 2021-07-26 NOTE — Discharge Instructions (Signed)
Please get better shoe inserts ?You may also want to change your work shoes to another type of shoe that has a small cushion ?Icing of your foot when you get home from a long day will help ?Elevation of your feet when you get home will help with swelling ?Gentle stretching exercises will help with ankle strength and foot pain. ?Return to urgent care for further evaluation if symptoms worsen ?No indication for imaging studies at this time. ?

## 2021-07-28 NOTE — ED Provider Notes (Signed)
?[facility] ? ? ? ?CSN: [id number] ?Arrival date & time: 07/26/21  5093 ? ? ?  ? ?History   ?Chief Complaint ?Chief Complaint  ?Patient presents with  ? Foot Pain  ?  Left  ?  ? ? ?HPI ?Meghan Barrett is a 51 y.o. female comes to the urgent care with left foot swelling of 1 day duration.  Patient noticed the left foot swelling yesterday.  It is associated with throbbing pain.  No redness.  No fever or chills.  No trauma or falls.  Patient says she has had this problem in the past and was supposed to have cortisone shots.  MRI previously was negative for any abnormality.  Patient's current job requires long hours of standing.  No bruising on the feet.  ? ?HPI ? ?Past Medical History:  ?Diagnosis Date  ? Anxiety   ? Asthma   ? Depression   ? Diabetes mellitus ([facility]) dx'd 06/19/2013  ? Hernia   ? High cholesterol   ? Hx of diabetes mellitus 01/27/2020  ? Schizophrenia ([facility])   ? ? ?Patient Active Problem List  ? Diagnosis Date Noted  ? Vesicular skin lesions 11/06/2020  ? Chronic pain of both knees 08/26/2020  ? Chronic bilateral low back pain without sciatica 08/26/2020  ? Bacterial vaginosis 05/24/2020  ? Hx of diabetes mellitus 01/27/2020  ? Gastroesophageal reflux disease 01/27/2020  ? Mild intermittent asthma 12/13/2019  ? Hyperlipidemia 08/17/2015  ? Schizophrenia ([facility]) 06/19/2013  ? ? ?Past Surgical History:  ?Procedure Laterality Date  ? APPENDECTOMY  08-06-15  ? CESAREAN SECTION  1991; 1999; 2002  ? FINGER FRACTURE SURGERY Left 2010  ? TUBAL LIGATION  2002  ? ? ?OB History   ?No obstetric history on file. ?  ? ? ? ?Home Medications   ? ?Prior to Admission medications   ?Medication Sig Start Date End Date Taking? Authorizing Provider  ?albuterol (VENTOLIN HFA) 108 (90 Base) MCG/ACT inhaler Inhale 2 puffs into the lungs every 6 (six) hours as needed for wheezing or shortness of breath. 03/24/21   Autry-Lott, Randa Evens, DO  ?cetirizine (ZYRTEC) 10 MG tablet TAKE 1 TABLET(10 MG) BY MOUTH DAILY 06/22/21   Autry-Lott,  Randa Evens, DO  ?clotrimazole-betamethasone (LOTRISONE) cream Apply to affected area 2 times daily prn 07/02/21   Jannifer Franklin, MD  ?diclofenac Sodium (VOLTAREN) 1 % GEL Apply to left knee and left foot for pain 03/24/21   Autry-Lott, Randa Evens, DO  ?diclofenac Sodium (VOLTAREN) 1 % GEL Apply 2 g topically 4 (four) times daily. Rub into affected area of foot 2 to 4 times daily 05/31/21   Vivi Barrack, DPM  ?famotidine (PEPCID) 10 MG tablet Take 1 tablet (10 mg total) by mouth 2 (two) times daily. 12/24/20   Erick Alley, DO  ?FLUoxetine (PROZAC) 20 MG capsule Take 20 mg by mouth daily. 12/02/19   [provider]  ?fluticasone (FLONASE) 50 MCG/ACT nasal spray Place 1 spray into both nostrils daily. 11/17/20   Doreene Eland, MD  ?methylPREDNISolone (MEDROL DOSEPAK) 4 MG TBPK tablet Take as directed for asthma/allergies 07/02/21   Jannifer Franklin, MD  ?montelukast (SINGULAIR) 10 MG tablet Take 1 tablet (10 mg total) by mouth at bedtime. 02/23/21   Westley Chandler, MD  ?mupirocin ointment (BACTROBAN) 2 % Apply 1 application topically daily. 04/30/21   Raspet, Noberto Retort, PA-C  ?polyethylene glycol powder (GLYCOLAX/MIRALAX) 17 GM/SCOOP powder Take 17 g by mouth daily. 01/11/21   Autry-Lott, Randa Evens, DO  ?traZODone (DESYREL) 50 MG  tablet Take by mouth. 03/06/20   [provider]  ?triamcinolone cream (KENALOG) 0.1 % Apply 1 application topically 2 (two) times daily. 11/20/20   Sabino DickEspinoza, Alejandra, DO  ? ? ?Family History ?Family History  ?Problem Relation Age of Onset  ? Alcohol abuse Mother   ? Drug abuse Mother   ? Diabetes Mother   ? Alcohol abuse Father   ? Drug abuse Father   ? Heart attack Father   ? Diabetes Father   ? Diabetes Sister   ? Early death Sister   ? High Cholesterol Sister   ? Cancer Other   ? Stroke Other   ? ? ?Social History ?Social History  ? ?Tobacco Use  ? Smoking status: Some Days  ?  Years: 25.00  ?  Types: Cigarettes  ? Smokeless tobacco: Never  ? Tobacco comments:  ?  06/19/2013 "smoke ~ 5  cigarettes/month"  ?Substance Use Topics  ? Alcohol use: Yes  ?  Alcohol/week: 1.0 standard drink  ?  Types: 1 Cans of beer per week  ? Drug use: Yes  ?  Types: "Crack" cocaine  ?  Comment: 06/19/2013 "got off crack cocaine almost 1 yr ago"  ? ? ? ?Allergies   ?Penicillins ? ? ?Review of Systems ?Review of Systems  ?Gastrointestinal: Negative.   ?Musculoskeletal:  Positive for arthralgias and joint swelling. Negative for gait problem and myalgias.  ?Skin:  Negative for color change, rash and wound.  ? ? ?Physical Exam ?Triage Vital Signs ?ED Triage Vitals  ?Enc Vitals Group  ?   BP 07/26/21 0915 110/68  ?   Pulse Rate 07/26/21 0915 70  ?   Resp 07/26/21 0915 17  ?   Temp 07/26/21 0918 (!) 97.5 ?F (36.4 ?C)  ?   Temp Source 07/26/21 0915 Oral  ?   SpO2 07/26/21 0915 99 %  ?   Weight --   ?   Height --   ?   Head Circumference --   ?   Peak Flow --   ?   Pain Score 07/26/21 0917 5  ?   Pain Loc --   ?   Pain Edu? --   ?   Excl. in [place]? --   ? ?No data found. ? ?Updated Vital Signs ?BP 110/68   Pulse 70   Temp (!) 97.5 ?F (36.4 ?C)   Resp 17   SpO2 99%  ? ?Visual Acuity ?Right Eye Distance:   ?Left Eye Distance:   ?Bilateral Distance:   ? ?Right Eye Near:   ?Left Eye Near:    ?Bilateral Near:    ? ?Physical Exam ?Vitals and nursing note reviewed.  ?Constitutional:   ?   General: She is not in acute distress. ?   Appearance: She is not ill-appearing.  ?Cardiovascular:  ?   Rate and Rhythm: Normal rate and regular rhythm.  ?Musculoskeletal:     ?   General: Tenderness present. Normal range of motion.  ?   Comments: Tenderness over the left midfoot.  No bruising.  Full range of motion of the left ankle.  ?Neurological:  ?   Mental Status: She is alert.  ? ? ? ?UC Treatments / Results  ?Labs ?(all labs ordered are listed, but only abnormal results are displayed) ?Labs Reviewed - No data to display ? ?EKG ? ? ?Radiology ?No results found. ? ?Procedures ?Procedures (including critical care time) ? ?Medications Ordered in  UC ?Medications - No data to display ? ?Initial  Impression / Assessment and Plan / UC Course  ?I have reviewed the triage vital signs and the nursing notes. ? ?Pertinent labs & imaging results that were available during my care of the patient were reviewed by me and considered in my medical decision making (see chart for details). ? ?  ? ?1.  Left foot sprain: ?Patient advised to change his shoes and shoe inserts ?Elevation of the leg ?Gentle range of motion exercises ?Icing of the left leg ?No indication for imaging at this time ?Anti-inflammatory medication as needed for pain ?Return to urgent care if symptoms worsen ?Final Clinical Impressions(s) / UC Diagnoses  ? ?Final diagnoses:  ?Foot sprain, left, subsequent encounter  ? ? ? ?Discharge Instructions   ? ?  ?Please get better shoe inserts ?You may also want to change your work shoes to another type of shoe that has a small cushion ?Icing of your foot when you get home from a long day will help ?Elevation of your feet when you get home will help with swelling ?Gentle stretching exercises will help with ankle strength and foot pain. ?Return to urgent care for further evaluation if symptoms worsen ?No indication for imaging studies at this time. ? ? ?ED Prescriptions   ?None ?  ? ?PDMP not reviewed this encounter. ?  ?Merrilee Jansky, MD ?07/28/21 [phone] ? ?

## 2021-08-04 ENCOUNTER — Ambulatory Visit: Payer: Medicaid Other | Admitting: Family Medicine

## 2021-08-10 ENCOUNTER — Other Ambulatory Visit: Payer: Self-pay | Admitting: Family Medicine

## 2021-08-26 ENCOUNTER — Ambulatory Visit: Payer: Medicaid Other | Admitting: Family Medicine

## 2021-08-26 NOTE — Progress Notes (Deleted)
    SUBJECTIVE:   CHIEF COMPLAINT / HPI:   ***  PERTINENT  PMH / PSH: ***  OBJECTIVE:   There were no vitals taken for this visit.  ***  ASSESSMENT/PLAN:   No problem-specific Assessment & Plan notes found for this encounter.     Shawndell Schillaci Autry-Lott, DO [facility]   

## 2021-08-26 NOTE — Patient Instructions (Incomplete)
It was wonderful to see you today. ? ?Please bring ALL of your medications with you to every visit.  ? ?Today we talked about: ? ?** ? ?Please be sure to schedule follow up at the front  desk before you leave today.  ? ?If you haven't already, sign up for My Chart to have easy access to your labs results, and communication with your primary care physician. ? ?Please call the clinic at [phone] if your symptoms worsen or you have any concerns. It was our pleasure to serve you. ? ?Dr. Autry-Lott ? ?

## 2021-09-09 ENCOUNTER — Ambulatory Visit (INDEPENDENT_AMBULATORY_CARE_PROVIDER_SITE_OTHER): Payer: Medicaid Other | Admitting: Family Medicine

## 2021-09-09 ENCOUNTER — Encounter: Payer: Self-pay | Admitting: Family Medicine

## 2021-09-09 ENCOUNTER — Other Ambulatory Visit (HOSPITAL_COMMUNITY)
Admission: RE | Admit: 2021-09-09 | Discharge: 2021-09-09 | Disposition: A | Discharge status: Home / Self Care | Payer: Medicaid Other | Source: Ambulatory Visit | Attending: Family Medicine | Admitting: Family Medicine

## 2021-09-09 VITALS — BP 100/60 | HR 59 | Ht 66.0 in | Wt 144.0 lb

## 2021-09-09 DIAGNOSIS — Z1159 Encounter for screening for other viral diseases: Secondary | ICD-10-CM | POA: Diagnosis not present

## 2021-09-09 DIAGNOSIS — N930 Postcoital and contact bleeding: Secondary | ICD-10-CM | POA: Insufficient documentation

## 2021-09-09 DIAGNOSIS — Z113 Encounter for screening for infections with a predominantly sexual mode of transmission: Secondary | ICD-10-CM | POA: Diagnosis not present

## 2021-09-09 LAB — POCT WET PREP (WET MOUNT)
Clue Cells Wet Prep Whiff POC: NEGATIVE
Trichomonas Wet Prep HPF POC: ABSENT

## 2021-09-09 NOTE — Patient Instructions (Signed)
We will follow up on the additional testing when available. Until then use protection.   Please call the clinic at [phone] if your symptoms worsen or you have any concerns. It was our pleasure to serve you.

## 2021-09-09 NOTE — Progress Notes (Unsigned)
    SUBJECTIVE:   CHIEF COMPLAINT / HPI:     PERTINENT  PMH / PSH: ***  OBJECTIVE:   BP 100/60   Pulse (!) 59   Ht 5\' 6"  (1.676 m)   Wt 144 lb (65.3 kg)   SpO2 98%   BMI 23.24 kg/m   ***  ASSESSMENT/PLAN:   No problem-specific Assessment & Plan notes found for this encounter.     , DO [facility]

## 2021-09-10 LAB — HIV ANTIBODY (ROUTINE TESTING W REFLEX): HIV Screen 4th Generation wRfx: NONREACTIVE

## 2021-09-10 LAB — RPR: RPR Ser Ql: NONREACTIVE

## 2021-09-10 LAB — CERVICOVAGINAL ANCILLARY ONLY
Chlamydia: NEGATIVE
Comment: NEGATIVE
Comment: NORMAL
Neisseria Gonorrhea: NEGATIVE

## 2021-09-10 LAB — HCV AB W REFLEX TO QUANT PCR: HCV Ab: NONREACTIVE

## 2021-09-10 LAB — HCV INTERPRETATION

## 2021-09-11 DIAGNOSIS — Z1159 Encounter for screening for other viral diseases: Secondary | ICD-10-CM | POA: Insufficient documentation

## 2021-09-11 DIAGNOSIS — N930 Postcoital and contact bleeding: Secondary | ICD-10-CM | POA: Insufficient documentation

## 2021-09-11 DIAGNOSIS — Z0000 Encounter for general adult medical examination without abnormal findings: Secondary | ICD-10-CM | POA: Insufficient documentation

## 2021-09-11 DIAGNOSIS — Z113 Encounter for screening for infections with a predominantly sexual mode of transmission: Secondary | ICD-10-CM | POA: Insufficient documentation

## 2021-09-11 NOTE — Assessment & Plan Note (Addendum)
Most recent Pap normal and negative for HPV.  Today wet prep unremarkable.  We will follow-up infectious screening.  Did not see a polyp on exam but could consider pelvic ultrasound for possible endocervical polyp if continues. - Cervicovaginal ancillary only - POCT Wet Prep [employer])

## 2021-09-14 ENCOUNTER — Encounter: Payer: Self-pay | Admitting: Family Medicine

## 2021-09-16 ENCOUNTER — Ambulatory Visit: Payer: Medicaid Other | Admitting: Family Medicine

## 2021-10-01 ENCOUNTER — Ambulatory Visit (HOSPITAL_COMMUNITY)
Admission: EM | Admit: 2021-10-01 | Discharge: 2021-10-01 | Disposition: A | Discharge status: Home / Self Care | Payer: Medicaid Other | Attending: Physician Assistant | Admitting: Physician Assistant

## 2021-10-01 ENCOUNTER — Encounter (HOSPITAL_COMMUNITY): Payer: Self-pay | Admitting: Emergency Medicine

## 2021-10-01 DIAGNOSIS — R238 Other skin changes: Secondary | ICD-10-CM

## 2021-10-01 MED ORDER — VALACYCLOVIR HCL 500 MG PO TABS: 500.0000 mg | ORAL_TABLET | Freq: Two times a day (BID) | ORAL | 0 refills | Status: DC

## 2021-10-01 NOTE — ED Triage Notes (Addendum)
Patient c/o rash x 1 week.   Patient endorses rash is present on RT buttocks.   Patient endorses " I've had this issue for years but it flared up 4 months ago".   Patient has used valacyclovir with some relief of symptoms. Patient has used  clotrimazole and betamethasone with some relief of symptoms. Patient states she's finished both of these prescriptions.

## 2021-10-01 NOTE — ED Provider Notes (Signed)
[facility]    CSN: [id number] Arrival date & time: 10/01/21  1443      History   Chief Complaint Chief Complaint  Patient presents with   Rash    HPI Meghan Barrett is a 51 y.o. female.   Patient presents today with a pruritic rash on her right gluteal cleft.  Reports that she was seen for similar symptoms by her PCP who prescribed Valtrex due to concern for herpes.  She is also been given topical medications including topical steroids as well as clotrimazole.  Denies any changes to personal hygiene products including soaps or detergents.  Denies history of dermatological condition including psoriasis or eczema.  She does report that symptoms initially improved with medication but have recurred.  She does not take antiviral therapy prophylactically.  Denies any spread of rash or additional symptoms including fever, nausea, vomiting.  She is confident that she is not pregnant.    Past Medical History:  Diagnosis Date   Anxiety    Asthma    Depression    Diabetes mellitus (HCC) dx'd 06/19/2013   Hernia    High cholesterol    Hx of diabetes mellitus 01/27/2020   Schizophrenia [facility])     Patient Active Problem List   Diagnosis Date Noted   PCB (post coital bleeding) 09/11/2021   Screening for viral disease 09/11/2021   Screening examination for sexually transmitted disease 09/11/2021   Vesicular skin lesions 11/06/2020   Chronic pain of both knees 08/26/2020   Chronic bilateral low back pain without sciatica 08/26/2020   Hx of diabetes mellitus 01/27/2020   Gastroesophageal reflux disease 01/27/2020   Mild intermittent asthma 12/13/2019   Hyperlipidemia 08/17/2015   Schizophrenia (HCC) 06/19/2013    Past Surgical History:  Procedure Laterality Date   APPENDECTOMY  08-06-15   CESAREAN SECTION  1991; 1999; 2002   FINGER FRACTURE SURGERY Left 2010   TUBAL LIGATION  2002    OB History   No obstetric history on file.      Home Medications    Prior to  Admission medications   Medication Sig Start Date End Date Taking? Authorizing Provider  cetirizine (ZYRTEC) 10 MG tablet TAKE 1 TABLET(10 MG) BY MOUTH DAILY 08/10/21  Yes Autry-Lott, Randa Evens, DO  clotrimazole-betamethasone (LOTRISONE) cream Apply to affected area 2 times daily prn 07/02/21  Yes Piontek, Ellen Mayol, MD  famotidine (PEPCID) 10 MG tablet Take 1 tablet (10 mg total) by mouth 2 (two) times daily. 12/24/20  Yes Erick Alley, DO  FLUoxetine (PROZAC) 20 MG capsule Take 20 mg by mouth daily. 12/02/19  Yes [provider]  montelukast (SINGULAIR) 10 MG tablet Take 1 tablet (10 mg total) by mouth at bedtime. 02/23/21  Yes Westley Chandler, MD  traZODone (DESYREL) 50 MG tablet Take by mouth. 03/06/20  Yes [provider]  valACYclovir (VALTREX) 500 MG tablet Take 1 tablet (500 mg total) by mouth 2 (two) times daily. 10/01/21  Yes Everley Evora K, PA-C  albuterol (VENTOLIN HFA) 108 (90 Base) MCG/ACT inhaler Inhale 2 puffs into the lungs every 6 (six) hours as needed for wheezing or shortness of breath. 03/24/21   Autry-Lott, Randa Evens, DO  diclofenac Sodium (VOLTAREN) 1 % GEL Apply to left knee and left foot for pain 03/24/21   Autry-Lott, Randa Evens, DO  diclofenac Sodium (VOLTAREN) 1 % GEL Apply 2 g topically 4 (four) times daily. Rub into affected area of foot 2 to 4 times daily 05/31/21   Vivi Barrack, DPM  fluticasone (FLONASE) 50 MCG/ACT nasal spray Place 1 spray into both nostrils daily. 11/17/20   Doreene Eland, MD  mupirocin ointment (BACTROBAN) 2 % Apply 1 application topically daily. 04/30/21   Khi Mcmillen, Denny Peon K, PA-C  polyethylene glycol powder (GLYCOLAX/MIRALAX) 17 GM/SCOOP powder Take 17 g by mouth daily. 01/11/21   Autry-Lott, Randa Evens, DO    Family History Family History  Problem Relation Age of Onset   Alcohol abuse Mother    Drug abuse Mother    Diabetes Mother    Alcohol abuse Father    Drug abuse Father    Heart attack Father    Diabetes Father    Diabetes Sister     Early death Sister    High Cholesterol Sister    Cancer Other    Stroke Other     Social History Social History   Tobacco Use   Smoking status: Some Days    Years: 25.00    Types: Cigarettes   Smokeless tobacco: Never   Tobacco comments:    06/19/2013 "smoke ~ 5 cigarettes/month"  Substance Use Topics   Alcohol use: Yes    Alcohol/week: 1.0 standard drink of alcohol    Types: 1 Cans of beer per week   Drug use: Yes    Types: "Crack" cocaine    Comment: 06/19/2013 "got off crack cocaine almost 1 yr ago"     Allergies   Penicillins   Review of Systems Review of Systems  Constitutional:  Negative for activity change, appetite change, fatigue and fever.  Musculoskeletal:  Negative for arthralgias and myalgias.  Skin:  Positive for rash. Negative for color change and wound.  Neurological:  Negative for dizziness, light-headedness and headaches.     Physical Exam Triage Vital Signs ED Triage Vitals  Enc Vitals Group     BP 10/01/21 1541 109/71     Pulse Rate 10/01/21 1541 75     Resp 10/01/21 1541 12     Temp 10/01/21 1541 97.9 F (36.6 C)     Temp Source 10/01/21 1541 Oral     SpO2 10/01/21 1541 96 %     Weight --      Height --      Head Circumference --      Peak Flow --      Pain Score 10/01/21 1545 0     Pain Loc --      Pain Edu? --      Excl. in GC? --    No data found.  Updated Vital Signs BP 109/71 (BP Location: Right Arm)   Pulse 75   Temp 97.9 F (36.6 C) (Oral)   Resp 12   LMP 09/15/2021 (Exact Date)   SpO2 96%   Visual Acuity Right Eye Distance:   Left Eye Distance:   Bilateral Distance:    Right Eye Near:   Left Eye Near:    Bilateral Near:     Physical Exam Vitals reviewed.  Constitutional:      General: She is awake. She is not in acute distress.    Appearance: Normal appearance. She is well-developed. She is not ill-appearing.     Comments: Very pleasant female appears stated age in no acute distress sitting comfortably in  exam room  HENT:     Head: Normocephalic and atraumatic.     Mouth/Throat:     Pharynx: Uvula midline. No oropharyngeal exudate or posterior oropharyngeal erythema.  Cardiovascular:     Rate and Rhythm: Normal rate and regular  rhythm.     Heart sounds: Normal heart sounds, S1 normal and S2 normal. No murmur heard. Pulmonary:     Effort: Pulmonary effort is normal.     Breath sounds: Normal breath sounds. No wheezing, rhonchi or rales.     Comments: Clear to auscultation bilaterally Abdominal:     Palpations: Abdomen is soft.     Tenderness: There is no abdominal tenderness. There is no right CVA tenderness, left CVA tenderness, guarding or rebound.  Skin:    Findings: Rash present. Rash is vesicular.          Comments: 4 cm x 2 cm erythematous rash with vesicles noted right buttocks.  Psychiatric:        Behavior: Behavior is cooperative.      UC Treatments / Results  Labs (all labs ordered are listed, but only abnormal results are displayed) Labs Reviewed - No data to display  EKG   Radiology No results found.  Procedures Procedures (including critical care time)  Medications Ordered in UC Medications - No data to display  Initial Impression / Assessment and Plan / UC Course  I have reviewed the triage vital signs and the nursing notes.  Pertinent labs & imaging results that were available during my care of the patient were reviewed by me and considered in my medical decision making (see chart for details).     Symptoms similar in appearance to herpes.  Patient was prescribed Valtrex with instruction to use this twice daily for minimum of 3 days but can use for up to a week.  If she continues to have recurrent episodes but these respond to Valtrex we may have to consider suppressive therapy in the future.  Recommend she follow-up with her PCP to discuss this.  Recommended hypoallergenic soaps and detergents.  If she has any additional symptoms including spread of  rash, fever, nausea, vomiting she needs to be seen immediately.  Strict return precautions given.  Patient declined work excuse note today.  Final Clinical Impressions(s) / UC Diagnoses   Final diagnoses:  Vesicular rash   Discharge Instructions   None    ED Prescriptions     Medication Sig Dispense Auth. Provider   valACYclovir (VALTREX) 500 MG tablet Take 1 tablet (500 mg total) by mouth 2 (two) times daily. 20 tablet Kiylah Loyer, Noberto Retort, PA-C      PDMP not reviewed this encounter.   Jeani Hawking, PA-C 10/01/21 1610

## 2021-10-01 NOTE — Discharge Instructions (Addendum)
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  Start valacyclovir as previously prescribed.  If this continues to happen we may need to see about using this medication every day to prevent episodes.  Follow-up with your primary care to discuss this.  If you have any spread of rash or additional symptoms you need to be seen.

## 2021-10-25 ENCOUNTER — Ambulatory Visit: Payer: Medicaid Other | Admitting: Family Medicine

## 2021-12-13 ENCOUNTER — Telehealth: Payer: Self-pay | Admitting: *Deleted

## 2021-12-13 NOTE — Telephone Encounter (Signed)
Patient called about a referral to a dentist.  Attempted to call patient back but there was no answer or voicemail.  Will mail her a list of medicaid dentist but she can contact medicaid for the most updated list.  Brandi Tomlinson,CMA

## 2021-12-16 ENCOUNTER — Other Ambulatory Visit (HOSPITAL_COMMUNITY): Payer: Self-pay

## 2021-12-16 ENCOUNTER — Other Ambulatory Visit: Payer: Self-pay | Admitting: Student

## 2021-12-16 ENCOUNTER — Ambulatory Visit (INDEPENDENT_AMBULATORY_CARE_PROVIDER_SITE_OTHER): Payer: Medicaid Other | Admitting: Student

## 2021-12-16 VITALS — BP 103/66 | HR 67 | Ht 66.0 in | Wt 144.8 lb

## 2021-12-16 DIAGNOSIS — G8929 Other chronic pain: Secondary | ICD-10-CM

## 2021-12-16 DIAGNOSIS — K219 Gastro-esophageal reflux disease without esophagitis: Secondary | ICD-10-CM | POA: Diagnosis present

## 2021-12-16 DIAGNOSIS — J302 Other seasonal allergic rhinitis: Secondary | ICD-10-CM | POA: Insufficient documentation

## 2021-12-16 DIAGNOSIS — N951 Menopausal and female climacteric states: Secondary | ICD-10-CM

## 2021-12-16 DIAGNOSIS — R053 Chronic cough: Secondary | ICD-10-CM | POA: Diagnosis not present

## 2021-12-16 DIAGNOSIS — J4540 Moderate persistent asthma, uncomplicated: Secondary | ICD-10-CM | POA: Diagnosis not present

## 2021-12-16 MED ORDER — MONTELUKAST SODIUM 10 MG PO TABS: 10.0000 mg | ORAL_TABLET | Freq: Every day | ORAL | 1 refills | Status: DC

## 2021-12-16 MED ORDER — FAMOTIDINE 10 MG PO TABS: 10.0000 mg | ORAL_TABLET | Freq: Two times a day (BID) | ORAL | 1 refills | Status: DC

## 2021-12-16 MED ORDER — DICLOFENAC SODIUM 1 % EX GEL: 2.0000 g | Freq: Four times a day (QID) | CUTANEOUS | 2 refills | Status: DC

## 2021-12-16 MED ORDER — FLUTICASONE PROPIONATE 50 MCG/ACT NA SUSP: 1.0000 | Freq: Every day | NASAL | 1 refills | Status: DC

## 2021-12-16 NOTE — Assessment & Plan Note (Addendum)
The etiology of the patient's cough is likely multifactorial in nature and her inconsistent adherence to her medications lately has likely exacerbated her symptoms.  The differential for most likely etiologies includes GERD, asthma exacerbation, and seasonal allergies and she has each of these conditions.  None of these diagnoses fit perfectly, but may all be contributing to her presentation.  Tuberculosis and malignancy were also considered as differential diagnoses, but lack of digital clubbing, night sweats, fevers, weight loss, hemoptysis, or otherwise productive cough are reassuring.  It may be prudent to consider low-dose CT screening or TB testing with PCP if patient's cough does not improve with medication resumption. - Refilled medications for GERD, asthma, and allergies as described in corresponding A&P items and provided education on the importance of consistent medication use - Provided return precautions if symptoms do not improve within a month

## 2021-12-16 NOTE — Assessment & Plan Note (Signed)
The patient continues to have chronic pain consistent with osteoarthritis that is unchanged from previous visits. - Refilled patient's Voltaren gel - Recommended increasing daily Tylenol to 650 mg pills twice or thrice daily and reassured patient that there is no risk of overdose if she takes this amount of the medication

## 2021-12-16 NOTE — Patient Instructions (Addendum)
It was great to see you today! Thank you for choosing [facility] for your primary care.  Today we talked about: - Your cough: We are refilling your famotidine for GERD, Flonase for allergies, Albuterol inhaler for asthma, and Montelukast pill for asthma. It is important that you take all of these medications daily and contact us for refills if you run out. We think a combination of issues is causing your cough including your asthma, GERD, and allergies. - Your hot flashes: We want to get your cough under control because that can be contributing to your hot flashes - Your body aches: If the Tylenol is helping with your aches and pains, you can take 2-3 more pills per day. We will refill your Voltaren gel for your bone aches.  If you haven't already, [email or web address] for My Chart to have easy access to your labs results, and communication with your primary care physician.  You should follow up with your PCP in a month if you are not feeling better. Otherwise, we will see you at your regular checkup!  I recommend that you always bring your medications to each appointment as this makes it easy to ensure you are on the correct medications and helps [name] not miss refills when you need them.  Please arrive 15 minutes before your appointment to ensure smooth check in process.  We appreciate your efforts in making this happen.  Please call the clinic at [phone] if your symptoms worsen or you have any concerns.  Thank you for coming to see [name] at [place] and for the opportunity to care for you! Lorenza Winkleman, Medical Student 12/16/2021, 2:30 PM

## 2021-12-16 NOTE — Assessment & Plan Note (Addendum)
The patient's swollen nasal turbinates and periodic nasal discharge are consistent with allergies. - Refilled the patient's Flonase and provided education that prolonged daily use is needed for effects to be seen - Recommended continuing cetrizine daily

## 2021-12-16 NOTE — Assessment & Plan Note (Signed)
The patient's reports of unprovoked intermittent hot flushes frequently throughout the day are consistent with perimenopausal symptoms.  Discussed the difficulty of treating these symptoms with the patient and recommended follow-up with PCP once her cough is adequately treated, which could be exacerbating her hot flush symptoms.

## 2021-12-16 NOTE — Assessment & Plan Note (Signed)
The patient's asthma appears poorly controlled and is consistent with moderate persistent diagnosis.  Her frequent use of her albuterol rescue inhaler and symptoms at night are concerning.  However, her lung exam today is unremarkable. It is likely that her inconsistent medication adherence is contributing significantly to her overall clinical picture and it is necessary to resume her current medications urgently. - Refilled patient's montelukast and albuterol, instructing her to take the montelukast daily and work towards reducing rescue inhaler use - Provided emergency precautions in case of asthma attack

## 2021-12-16 NOTE — Progress Notes (Cosign Needed)
SUBJECTIVE:   CHIEF COMPLAINT / HPI:   Meghan Barrett is a 51 y.o. female with a past medical history of seasonal allergies, asthma, GERD, type 2 DM, and HLD presenting to the clinic for a persistent cough, hot flushes, and medication refills.  Cough The patient reports that she has had several weeks (unsure of exact duration) of persistent nonproductive cough. The cough is worse in the mornings and after meals, but also occurs randomly throughout the day. She has not experienced any shortness of breath with exertion or at rest.  She reports she has been using her Albuterol inhaler up to 3 times a day and it does help with the cough.  She notes that she ran out of her famotidine, Montelukast, and Flonase weeks ago and has not been taking them since.  She does take cetrizine once daily and Benadryl every night for allergies, but still wakes up coughing.  She also notes that she does cough at night.  She denies fevers, chills, weight loss, hemoptysis, dyspnea with exertion, or production of phlegm. She does report that she feels like certain foods such as rice and lettuce leaves get stuck in her throat sometimes.  She is not seen by pulmonology.  Hot flushes The patient reports that since December of last year she has been experiencing sensations of heat passing over her with associated sweating every 5-10 minutes. Her LMP was 3 weeks ago and she reports that she gets her menstrual periods regularly every month for about 3 days with a 1 day pause and then resumes bleeding for 4 more days.  Chronic knee pain The patient describes a chronic pain in her knees that is constantly present and gets worse throughout the day with movement.  She reports that she takes 1 Tylenol 650 mg daily (she is afraid of overdose if she takes any more) and it helps significantly.  She has also previously used Voltaren gel with positive results, but is currently out of it.   PERTINENT  PMH/FH: Patient medications reviewed  this visit and marked as taking: Current Meds  Medication Sig   albuterol (VENTOLIN HFA) 108 (90 Base) MCG/ACT inhaler Inhale 2 puffs into the lungs every 6 (six) hours as needed for wheezing or shortness of breath.   cetirizine (ZYRTEC) 10 MG tablet TAKE 1 TABLET(10 MG) BY MOUTH DAILY   FLUoxetine (PROZAC) 20 MG capsule Take 20 mg by mouth daily.   polyethylene glycol powder (GLYCOLAX/MIRALAX) 17 GM/SCOOP powder Take 17 g by mouth daily.   traZODone (DESYREL) 50 MG tablet Take by mouth.   valACYclovir (VALTREX) 500 MG tablet Take 1 tablet (500 mg total) by mouth 2 (two) times daily.   REFILLED diclofenac Sodium (VOLTAREN) 1 % GEL Apply 2 g topically 4 (four) times daily. Rub into affected area of foot 2 to 4 times daily   REFILLED famotidine (PEPCID) 10 MG tablet Take 1 tablet (10 mg total) by mouth 2 (two) times daily.   REFILLED fluticasone (FLONASE) 50 MCG/ACT nasal spray Place 1 spray into both nostrils daily.   REFILLED montelukast (SINGULAIR) 10 MG tablet Take 1 tablet (10 mg total) by mouth at bedtime.   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 She reports that she smoked 1-5 cigarettes per day for 25 years, but quit 3 years ago. She has never vaped. She drinks 1 beer or glass of wine daily. She has a remote history of crack cocaine use, but has not used it in a decade.   OBJECTIVE:   BP 103/66   Pulse 67   Ht 5\' 6"  (1.676 m)   Wt 144 lb 12.8 oz (65.7 kg)   LMP 11/19/2021   SpO2 99%   BMI 23.37 kg/m    Physical exam: General: Resting comfortably in chair in no acute distress, alert and at baseline. HEENT: Head: Normocephalic, atraumatic. No  tenderness to percussion over sinuses. Eyes: PERRLA. Sclera without injection or icterus. Nose: Mild erythema of nasal turbinates. Mouth/Oral: Clear, no tonsillar exudate. MMM. Neck: Supple. No LAD, thyroid smooth and not palpable. Cardiovascular: Regular rate and rhythm. Normal S1/S2. No murmurs, rubs, or gallops appreciated. 2+ radial pulses. Pulmonary: Clear bilaterally to ascultation. No increased WOB, no accessory muscle usage on room air. No wheezes, rales, or crackles. Extremities: No peripheral edema bilaterally. No cyanosis or clubbing. Capillary refill < 2 seconds.   ASSESSMENT/PLAN:  Meghan Barrett is a 51 y.o. female with a past medical history of seasonal allergies, asthma, GERD, type 2 DM, and HLD presenting to the clinic for a persistent cough consistent with a multifactorial etiology of asthma, GERD, and allergies.  Hot flushes, perimenopausal The patient's reports of unprovoked intermittent hot flushes frequently throughout the day are consistent with perimenopausal symptoms.  Discussed the difficulty of treating these symptoms with the patient and recommended follow-up with PCP once her cough is adequately treated, which could be exacerbating her hot flush symptoms.  Chronic pain of both knees The patient continues to have chronic pain consistent with osteoarthritis that is unchanged from previous visits. - Refilled patient's Voltaren gel - Recommended increasing daily Tylenol to 650 mg pills twice or thrice daily and reassured patient that there is no risk of overdose if she takes this amount of the medication  Asthma in adult, moderate persistent, uncomplicated The patient's asthma appears poorly controlled and is consistent with moderate persistent diagnosis.  Her frequent use of her albuterol rescue inhaler and symptoms at night are concerning.  However, her lung exam today is unremarkable. It is likely that her inconsistent medication adherence is contributing significantly  to her overall clinical picture and it is necessary to resume her current medications urgently. - Refilled patient's montelukast and albuterol, instructing her to take the montelukast daily and work towards reducing rescue inhaler use - Provided emergency precautions in case of asthma attack  Gastroesophageal reflux disease The patient's postprandial cough and early morning cough are consistent with GERD.  She has been unable to take her famotidine due to running out. It is likely that her inconsistent medication adherence is contributing significantly to her overall clinical picture and it is necessary to resume her current medications urgently. - Refilled patient's famotidine and provided education on the importance of daily adherence to treatment - Recommended PCP follow-up concerning difficulty swallowing of certain foods if it does not improve with treatment of cough  Seasonal allergies The patient's swollen nasal turbinates and periodic nasal discharge are consistent with allergies. - Refilled the patient's Flonase and provided education that prolonged daily use is needed for  effects to be seen - Recommended continuing cetrizine daily  Persistent cough The etiology of the patient's cough is likely multifactorial in nature and her inconsistent adherence to her medications lately has likely exacerbated her symptoms.  The differential for most likely etiologies includes GERD, asthma exacerbation, and seasonal allergies and she has each of these conditions.  None of these diagnoses fit perfectly, but may all be contributing to her presentation.  Tuberculosis and malignancy were also considered as differential diagnoses, but lack of digital clubbing, night sweats, fevers, weight loss, hemoptysis, or otherwise productive cough are reassuring.  It may be prudent to consider low-dose CT screening or TB testing with PCP if patient's cough does not improve with medication resumption. - Refilled  medications for GERD, asthma, and allergies as described in corresponding A&P items and provided education on the importance of consistent medication use - Provided return precautions if symptoms do not improve within a month   [facility] doctor, Medical Student [facility]

## 2021-12-16 NOTE — Assessment & Plan Note (Addendum)
The patient's postprandial cough and early morning cough are consistent with GERD.  She has been unable to take her famotidine due to running out. It is likely that her inconsistent medication adherence is contributing significantly to her overall clinical picture and it is necessary to resume her current medications urgently. - Refilled patient's famotidine and provided education on the importance of daily adherence to treatment - Recommended PCP follow-up concerning difficulty swallowing of certain foods if it does not improve with treatment of cough

## 2021-12-17 NOTE — Progress Notes (Signed)
SUBJECTIVE:   CHIEF COMPLAINT / HPI:   Meghan Barrett is a 51 y.o. female with a past medical history of seasonal allergies, asthma, GERD, type 2 DM, and HLD presenting to the clinic for a persistent cough, hot flushes, and medication refills.  Cough The patient reports that she has had several weeks (unsure of exact duration) of persistent nonproductive cough. The cough is worse in the mornings and after meals, but also occurs randomly throughout the day. She has not experienced any shortness of breath with exertion or at rest.  She reports she has been using her Albuterol inhaler up to 3 times a day and it does help with the cough.  She notes that she ran out of her famotidine, Montelukast, and Flonase weeks ago and has not been taking them since.  She does take cetrizine once daily and Benadryl every night for allergies, but still wakes up coughing.  She also notes that she does cough at night.  She denies fevers, chills, weight loss, hemoptysis, dyspnea with exertion, or production of phlegm. She does report that she feels like certain foods such as rice and lettuce leaves get stuck in her throat sometimes.  She is not seen by pulmonology.  Hot flushes The patient reports that since December of last year she has been experiencing sensations of heat passing over her with associated sweating every 5-10 minutes. Her LMP was 3 weeks ago and she reports that she gets her menstrual periods regularly every month for about 3 days with a 1 day pause and then resumes bleeding for 4 more days.  Chronic knee pain The patient describes a chronic pain in her knees that is constantly present and gets worse throughout the day with movement.  She reports that she takes 1 Tylenol 650 mg daily (she is afraid of overdose if she takes any more) and it helps significantly.  She has also previously used Voltaren gel with positive results, but is currently out of it.   PERTINENT  PMH/FH: Patient medications reviewed  this visit and marked as taking: Current Meds  Medication Sig   albuterol (VENTOLIN HFA) 108 (90 Base) MCG/ACT inhaler Inhale 2 puffs into the lungs every 6 (six) hours as needed for wheezing or shortness of breath.   cetirizine (ZYRTEC) 10 MG tablet TAKE 1 TABLET(10 MG) BY MOUTH DAILY   FLUoxetine (PROZAC) 20 MG capsule Take 20 mg by mouth daily.   polyethylene glycol powder (GLYCOLAX/MIRALAX) 17 GM/SCOOP powder Take 17 g by mouth daily.   traZODone (DESYREL) 50 MG tablet Take by mouth.   valACYclovir (VALTREX) 500 MG tablet Take 1 tablet (500 mg total) by mouth 2 (two) times daily.   REFILLED diclofenac Sodium (VOLTAREN) 1 % GEL Apply 2 g topically 4 (four) times daily. Rub into affected area of foot 2 to 4 times daily   REFILLED famotidine (PEPCID) 10 MG tablet Take 1 tablet (10 mg total) by mouth 2 (two) times daily.   REFILLED fluticasone (FLONASE) 50 MCG/ACT nasal spray Place 1 spray into both nostrils daily.   REFILLED montelukast (SINGULAIR) 10 MG tablet Take 1 tablet (10 mg total) by mouth at bedtime.   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 She reports that she smoked 1-5 cigarettes per day for 25 years, but quit 3 years ago. She has never vaped. She drinks 1 beer or glass of wine daily. She has a remote history of crack cocaine use, but has not used it in a decade.   OBJECTIVE:   BP 103/66   Pulse 67   Ht 5\' 6"  (1.676 m)   Wt 144 lb 12.8 oz (65.7 kg)   LMP 11/19/2021   SpO2 99%   BMI 23.37 kg/m    Physical exam: General: Resting comfortably in chair in no acute distress, alert and at baseline. HEENT: Head: Normocephalic, atraumatic. No  tenderness to percussion over sinuses. Eyes: PERRLA. Sclera without injection or icterus. Nose: Mild erythema of nasal turbinates. Mouth/Oral: Clear, no tonsillar exudate. MMM. Neck: Supple. No LAD, thyroid smooth and not palpable. Cardiovascular: Regular rate and rhythm. Normal S1/S2. No murmurs, rubs, or gallops appreciated. 2+ radial pulses. Pulmonary: Clear bilaterally to ascultation. No increased WOB, no accessory muscle usage on room air. No wheezes, rales, or crackles. Extremities: No peripheral edema bilaterally. No cyanosis or clubbing. Capillary refill < 2 seconds.   ASSESSMENT/PLAN:  Meghan Barrett is a 51 y.o. female with a past medical history of seasonal allergies, asthma, GERD, type 2 DM, and HLD presenting to the clinic for a persistent cough consistent with a multifactorial etiology of asthma, GERD, and allergies.  Hot flushes, perimenopausal The patient's reports of unprovoked intermittent hot flushes frequently throughout the day are consistent with perimenopausal symptoms.  Discussed the difficulty of treating these symptoms with the patient and recommended follow-up with PCP once her cough is adequately treated, which could be exacerbating her hot flush symptoms.  Chronic pain of both knees The patient continues to have chronic pain consistent with osteoarthritis that is unchanged from previous visits. - Refilled patient's Voltaren gel - Recommended increasing daily Tylenol to 650 mg pills twice or thrice daily and reassured patient that there is no risk of overdose if she takes this amount of the medication  Asthma in adult, moderate persistent, uncomplicated The patient's asthma appears poorly controlled and is consistent with moderate persistent diagnosis.  Her frequent use of her albuterol rescue inhaler and symptoms at night are concerning.  However, her lung exam today is unremarkable. It is likely that her inconsistent medication adherence is contributing significantly  to her overall clinical picture and it is necessary to resume her current medications urgently. - Refilled patient's montelukast and albuterol, instructing her to take the montelukast daily and work towards reducing rescue inhaler use - Provided emergency precautions in case of asthma attack  Gastroesophageal reflux disease The patient's postprandial cough and early morning cough are consistent with GERD.  She has been unable to take her famotidine due to running out. It is likely that her inconsistent medication adherence is contributing significantly to her overall clinical picture and it is necessary to resume her current medications urgently. - Refilled patient's famotidine and provided education on the importance of daily adherence to treatment - Recommended PCP follow-up concerning difficulty swallowing of certain foods if it does not improve with treatment of cough  Seasonal allergies The patient's swollen nasal turbinates and periodic nasal discharge are consistent with allergies. - Refilled the patient's Flonase and provided education that prolonged daily use is needed for  effects to be seen - Recommended continuing cetrizine daily  Persistent cough The etiology of the patient's cough is likely multifactorial in nature and her inconsistent adherence to her medications lately has likely exacerbated her symptoms.  The differential for most likely etiologies includes GERD, asthma exacerbation, and seasonal allergies and she has each of these conditions.  None of these diagnoses fit perfectly, but may all be contributing to her presentation.  Tuberculosis and malignancy were also considered as differential diagnoses, but lack of digital clubbing, night sweats, fevers, weight loss, hemoptysis, or otherwise productive cough are reassuring.  It may be prudent to consider low-dose CT screening or TB testing with PCP if patient's cough does not improve with medication resumption. - Refilled  medications for GERD, asthma, and allergies as described in corresponding A&P items and provided education on the importance of consistent medication use - Provided return precautions if symptoms do not improve within a month   [facility] doctor, Medical Student [facility]    I was personally present and performed or re-performed the history, physical exam and medical decision making activities of this service and have verified that the service and findings are accurately documented in the student's note.  Cough likely multifactorial due to GERD, asthma (off of daily controller medicine) and allergies. Refilled medications for these diagnoses. No red flag symptoms. To f/u if ino improvement.  Darral Dash, DO                  12/17/2021, 7:52 AM

## 2022-01-06 ENCOUNTER — Other Ambulatory Visit: Payer: Self-pay | Admitting: Student

## 2022-01-06 ENCOUNTER — Other Ambulatory Visit: Payer: Self-pay | Admitting: Family Medicine

## 2022-02-07 ENCOUNTER — Ambulatory Visit: Payer: Medicaid Other | Admitting: Student

## 2022-02-07 NOTE — Progress Notes (Deleted)
  SUBJECTIVE:   CHIEF COMPLAINT / HPI:   ***  PERTINENT  PMH / PSH: ***  Past Medical History:  Diagnosis Date   Anxiety    Asthma    Depression    Diabetes mellitus (HCC) dx'd 06/19/2013   Hernia    High cholesterol    Hx of diabetes mellitus 01/27/2020   Schizophrenia (HCC)     OBJECTIVE:  There were no vitals taken for this visit. Physical Exam   ASSESSMENT/PLAN:  There are no diagnoses linked to this encounter. No follow-ups on file. Alfredo Martinez, MD 02/07/2022, 8:06 AM PGY-***, [facility] {    This will disappear when note is signed, click to select method of visit    :1}

## 2022-02-14 ENCOUNTER — Ambulatory Visit: Payer: Medicaid Other | Admitting: Family Medicine

## 2022-02-21 ENCOUNTER — Encounter (HOSPITAL_COMMUNITY): Payer: Self-pay | Admitting: *Deleted

## 2022-02-21 ENCOUNTER — Other Ambulatory Visit: Payer: Self-pay

## 2022-02-21 ENCOUNTER — Ambulatory Visit (HOSPITAL_COMMUNITY)
Admission: EM | Admit: 2022-02-21 | Discharge: 2022-02-21 | Disposition: A | Discharge status: Home / Self Care | Payer: Medicaid Other | Attending: Emergency Medicine | Admitting: Emergency Medicine

## 2022-02-21 DIAGNOSIS — R682 Dry mouth, unspecified: Secondary | ICD-10-CM

## 2022-02-21 DIAGNOSIS — M79604 Pain in right leg: Secondary | ICD-10-CM

## 2022-02-21 DIAGNOSIS — M79605 Pain in left leg: Secondary | ICD-10-CM | POA: Diagnosis not present

## 2022-02-21 LAB — CBG MONITORING, ED: Glucose-Capillary: 92 mg/dL (ref 70–99)

## 2022-02-21 NOTE — Discharge Instructions (Signed)
Your blood sugar looks great!  Talk with the person who prescribes your medicine about your dry mouth.   Look for compression socks at [facility] or [facility] and wear them to work or when you are standing a lot.

## 2022-02-21 NOTE — ED Provider Notes (Signed)
[facility]    CSN: [id number] Arrival date & time: 02/21/22  1614      History   Chief Complaint Chief Complaint  Patient presents with   Foot Swelling   blood sugar check    HPI Meghan Barrett is a 51 y.o. female. Pt works at [employer] and stands all day. Some days her L foot is painful and swollen after work; this happened again yesterday. It doesn't usually happen on the right.  Swelling and pain usually improve after she lays down and sleeps at night.  Separately, she is worried about her history of diabetes as she has had a dry mouth lately.  She has a history of drug-induced diabetes.  She reports that she took some kind of medicine that caused her blood sugar to be high and close diabetes.  After that medicine was stopped and she was switched to a different 1, for diabetes/elevated blood sugar went away.  However, because her mouth has been dry lately, she would like her blood sugar checked and wants to make sure that it has not come back.  She started trazodone recently and also wonders if that could be causing her dry mouth.   HPI  Past Medical History:  Diagnosis Date   Anxiety    Asthma    Depression    Diabetes mellitus (HCC) dx'd 06/19/2013   Hernia    High cholesterol    Hx of diabetes mellitus 01/27/2020   Schizophrenia [facility])     Patient Active Problem List   Diagnosis Date Noted   Hot flushes, perimenopausal 12/16/2021   Seasonal allergies 12/16/2021   Persistent cough 12/16/2021   PCB (post coital bleeding) 09/11/2021   Screening for viral disease 09/11/2021   Screening examination for sexually transmitted disease 09/11/2021   Vesicular skin lesions 11/06/2020   Chronic pain of both knees 08/26/2020   Chronic bilateral low back pain without sciatica 08/26/2020   Hx of diabetes mellitus 01/27/2020   Gastroesophageal reflux disease 01/27/2020   Asthma in adult, moderate persistent, uncomplicated 12/13/2019   Hyperlipidemia 08/17/2015   Schizophrenia  (HCC) 06/19/2013    Past Surgical History:  Procedure Laterality Date   APPENDECTOMY  08-06-15   CESAREAN SECTION  1991; 1999; 2002   FINGER FRACTURE SURGERY Left 2010   TUBAL LIGATION  2002    OB History   No obstetric history on file.      Home Medications    Prior to Admission medications   Medication Sig Start Date End Date Taking? Authorizing Provider  albuterol (VENTOLIN HFA) 108 (90 Base) MCG/ACT inhaler Inhale 2 puffs into the lungs every 6 (six) hours as needed for wheezing or shortness of breath. 03/24/21   Autry-Lott, Randa Evens, DO  cetirizine (ZYRTEC) 10 MG tablet TAKE 1 TABLET(10 MG) BY MOUTH DAILY 01/07/22   Alicia Amel, MD  clotrimazole-betamethasone (LOTRISONE) cream Apply to affected area 2 times daily prn 07/02/21   Jannifer Franklin, MD  diclofenac Sodium (VOLTAREN) 1 % GEL Apply to left knee and left foot for pain 03/24/21   Autry-Lott, Randa Evens, DO  diclofenac Sodium (VOLTAREN) 1 % GEL Apply 2 g topically 4 (four) times daily. Rub into affected area of foot 2 to 4 times daily 12/16/21   Dameron, Nolberto Hanlon, DO  famotidine (PEPCID) 10 MG tablet Take 1 tablet (10 mg total) by mouth 2 (two) times daily. 12/16/21   Dameron, Nolberto Hanlon, DO  FLUoxetine (PROZAC) 20 MG capsule Take 20 mg by mouth daily. 12/02/19   [provider]  fluticasone (FLONASE) 50 MCG/ACT nasal spray Place 1 spray into both nostrils daily. 12/16/21   Dameron, Nolberto Hanlon, DO  montelukast (SINGULAIR) 10 MG tablet TAKE 1 TABLET(10 MG) BY MOUTH AT BEDTIME 12/17/21   Alicia Amel, MD  mupirocin ointment (BACTROBAN) 2 % Apply 1 application topically daily. 04/30/21   Raspet, Denny Peon K, PA-C  polyethylene glycol powder (GLYCOLAX/MIRALAX) 17 GM/SCOOP powder Take 17 g by mouth daily. 01/11/21   Autry-Lott, Randa Evens, DO  traZODone (DESYREL) 50 MG tablet Take by mouth. 03/06/20   [provider]  valACYclovir (VALTREX) 500 MG tablet TAKE 1 TABLET(500 MG) BY MOUTH TWICE DAILY 01/07/22   Alicia Amel, MD     Family History Family History  Problem Relation Age of Onset   Alcohol abuse Mother    Drug abuse Mother    Diabetes Mother    Alcohol abuse Father    Drug abuse Father    Heart attack Father    Diabetes Father    Diabetes Sister    Early death Sister    High Cholesterol Sister    Cancer Other    Stroke Other     Social History Social History   Tobacco Use   Smoking status: Some Days    Years: 25.00    Types: Cigarettes   Smokeless tobacco: Never   Tobacco comments:    06/19/2013 "smoke ~ 5 cigarettes/month"  Substance Use Topics   Alcohol use: Yes    Alcohol/week: 1.0 standard drink of alcohol    Types: 1 Cans of beer per week   Drug use: Yes    Types: "Crack" cocaine    Comment: 06/19/2013 "got off crack cocaine almost 1 yr ago"     Allergies   Penicillins   Review of Systems Review of Systems   Physical Exam Triage Vital Signs ED Triage Vitals  Enc Vitals Group     BP 02/21/22 1932 103/67     Pulse Rate 02/21/22 1932 60     Resp 02/21/22 1932 18     Temp 02/21/22 1932 98.2 F (36.8 C)     Temp src --      SpO2 02/21/22 1932 100 %     Weight --      Height --      Head Circumference --      Peak Flow --      Pain Score 02/21/22 1929 8     Pain Loc --      Pain Edu? --      Excl. in GC? --    No data found.  Updated Vital Signs BP 103/67   Pulse 60   Temp 98.2 F (36.8 C)   Resp 18   SpO2 100%   Visual Acuity Right Eye Distance:   Left Eye Distance:   Bilateral Distance:    Right Eye Near:   Left Eye Near:    Bilateral Near:     Physical Exam Constitutional:      General: She is not in acute distress.    Appearance: Normal appearance. She is not ill-appearing.  Cardiovascular:     Pulses:          Dorsalis pedis pulses are 2+ on the left side.       Posterior tibial pulses are 2+ on the left side.  Pulmonary:     Effort: Pulmonary effort is normal.  Musculoskeletal:     Left lower leg: No edema.  Feet:  Left foot:      Skin integrity: Skin integrity normal. No erythema or warmth.  Skin:    Capillary Refill: Capillary refill takes less than 2 seconds.  Neurological:     Mental Status: She is alert.      UC Treatments / Results  Labs (all labs ordered are listed, but only abnormal results are displayed) Labs Reviewed  CBG MONITORING, ED    EKG   Radiology No results found.  Procedures Procedures (including critical care time)  Medications Ordered in UC Medications - No data to display  Initial Impression / Assessment and Plan / UC Course  I have reviewed the triage vital signs and the nursing notes.  Pertinent labs & imaging results that were available during my care of the patient were reviewed by me and considered in my medical decision making (see chart for details).     REassured pt her blood glucose is normal. L foot and ankle exam today normal - discussed using compression socks when she stands a lot such as at work.    Final Clinical Impressions(s) / UC Diagnoses   Final diagnoses:  Pain in both lower extremities  Dry mouth     Discharge Instructions      Your blood sugar looks great!  Talk with the person who prescribes your medicine about your dry mouth.   Look for compression socks at [facility] or [facility] and wear them to work or when you are standing a lot.    ED Prescriptions   None    PDMP not reviewed this encounter.   Cathlyn Parsons, NP 02/21/22 [phone]

## 2022-02-21 NOTE — ED Triage Notes (Signed)
Pt reports lt foot is swollen and Pt wants her blood sugar checked.

## 2022-04-11 ENCOUNTER — Encounter: Payer: Medicaid Other | Admitting: Student

## 2022-05-05 IMAGING — DX DG KNEE STANDING AP BILAT
1 series · 1 of 1 positions shown · non-contrast
Comparison: No recent prior.

CLINICAL DATA: Chronic pain. Concern for knee osteoarthritis. No
known injury.

EXAM:
BILATERAL KNEES STANDING - 1 VIEW

[knee ap bilat standing]
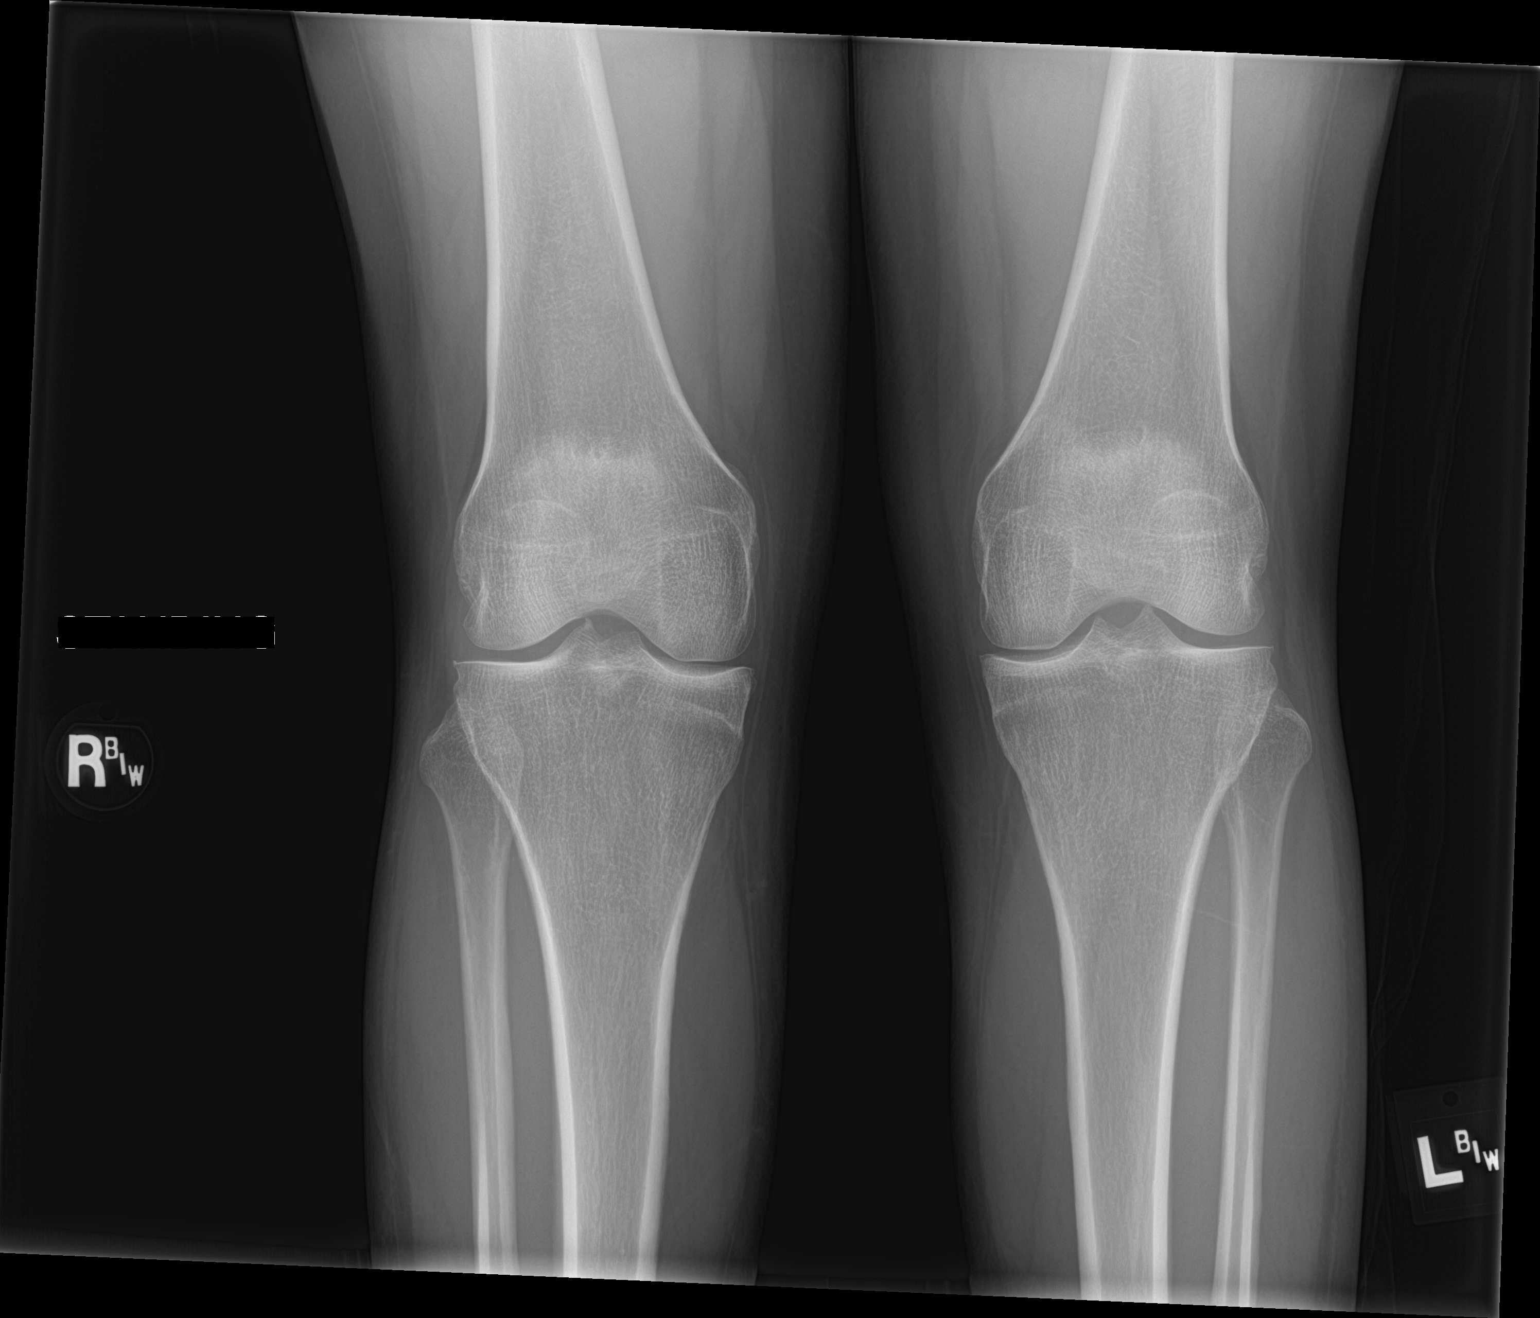

[1 of 1 positions shown; findings below may reference images not displayed]

FINDINGS: Mild medial and lateral compartment degenerative changes noted
bilaterally. No acute bony or joint abnormality.
IMPRESSION: Mild medial and lateral compartment degenerative changes noted
bilaterally. No acute abnormality identified.

## 2022-05-05 IMAGING — DX DG FOOT COMPLETE 3+V*L*
3 series · 3 of 3 positions shown · non-contrast
Comparison: No prior.

CLINICAL DATA: Left foot pain. Palpable painful nodule over second
metatarsal. No history of injury.

EXAM:
LEFT FOOT - COMPLETE 3+ VIEW

[foot ap]
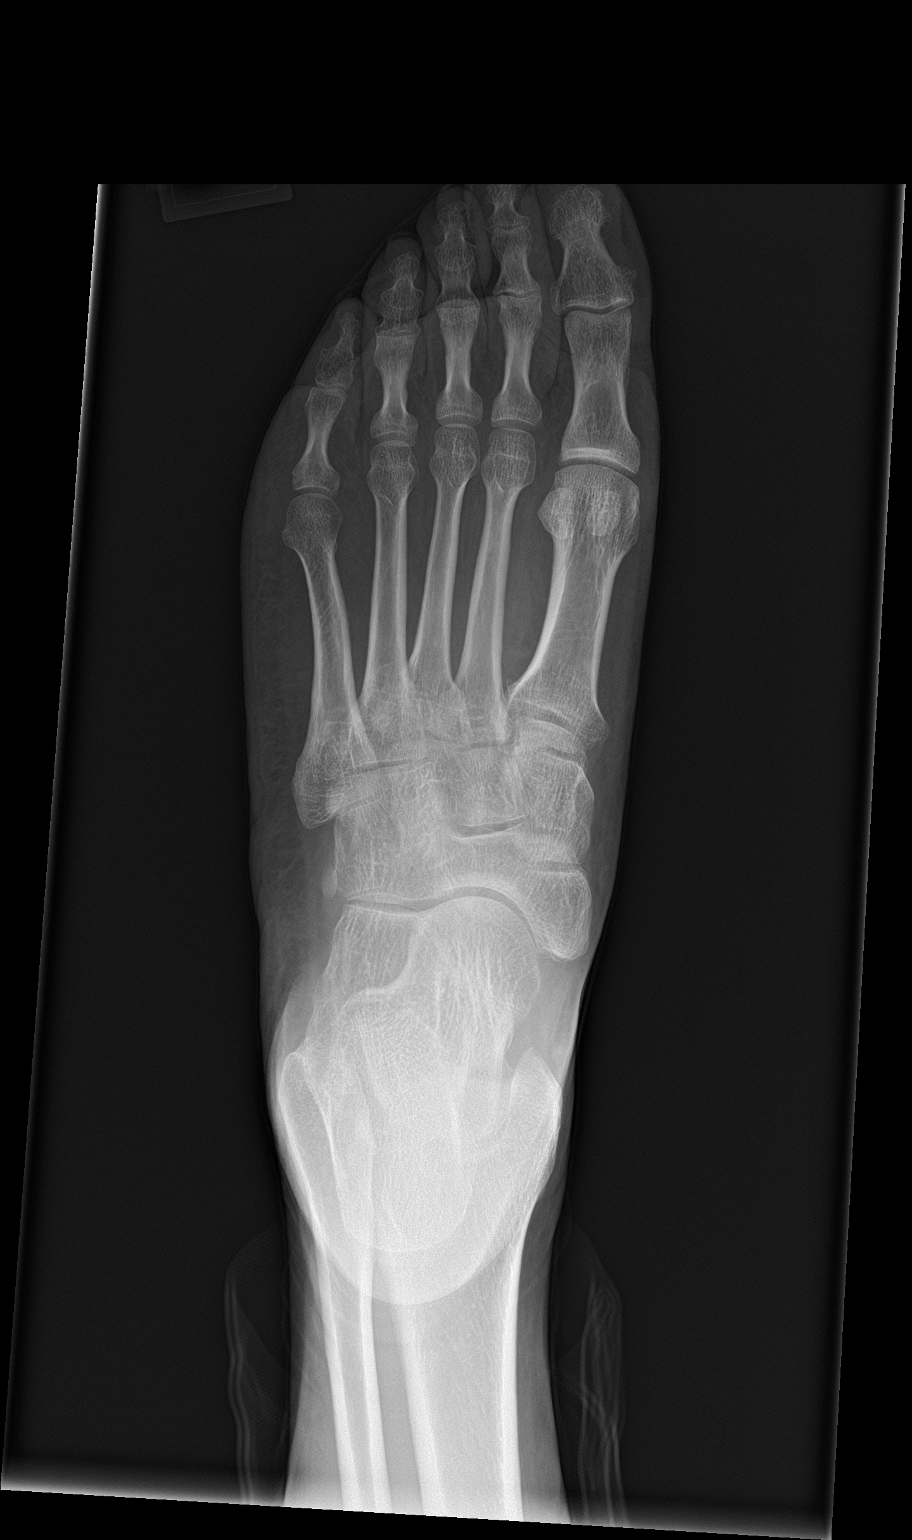

[foot obl]
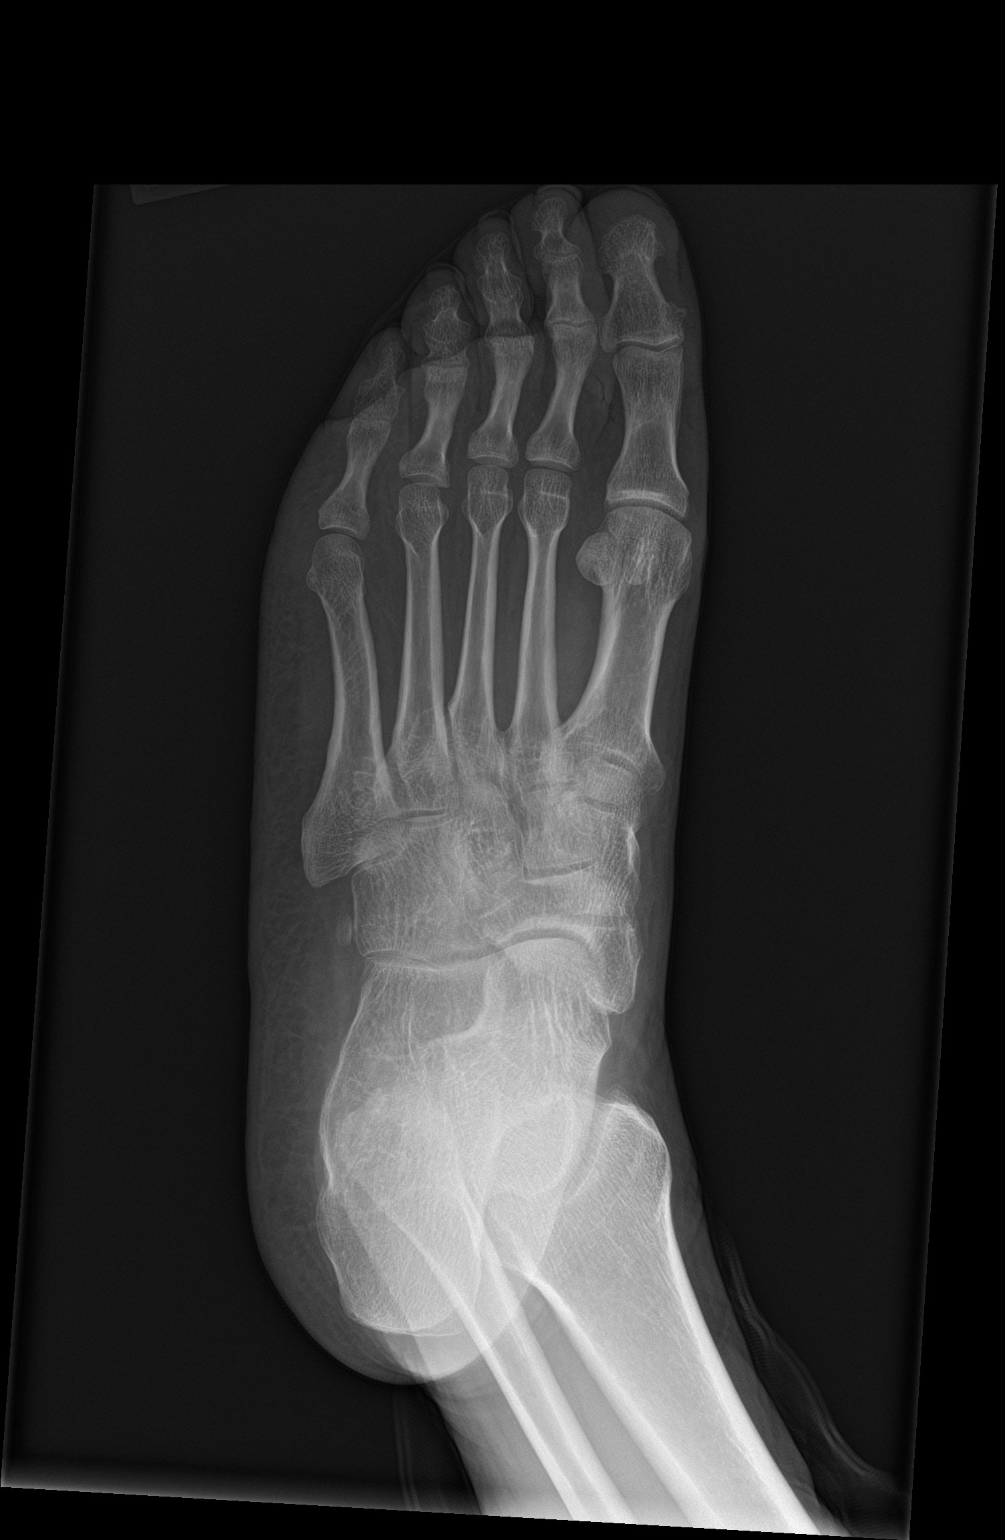

[foot lat]
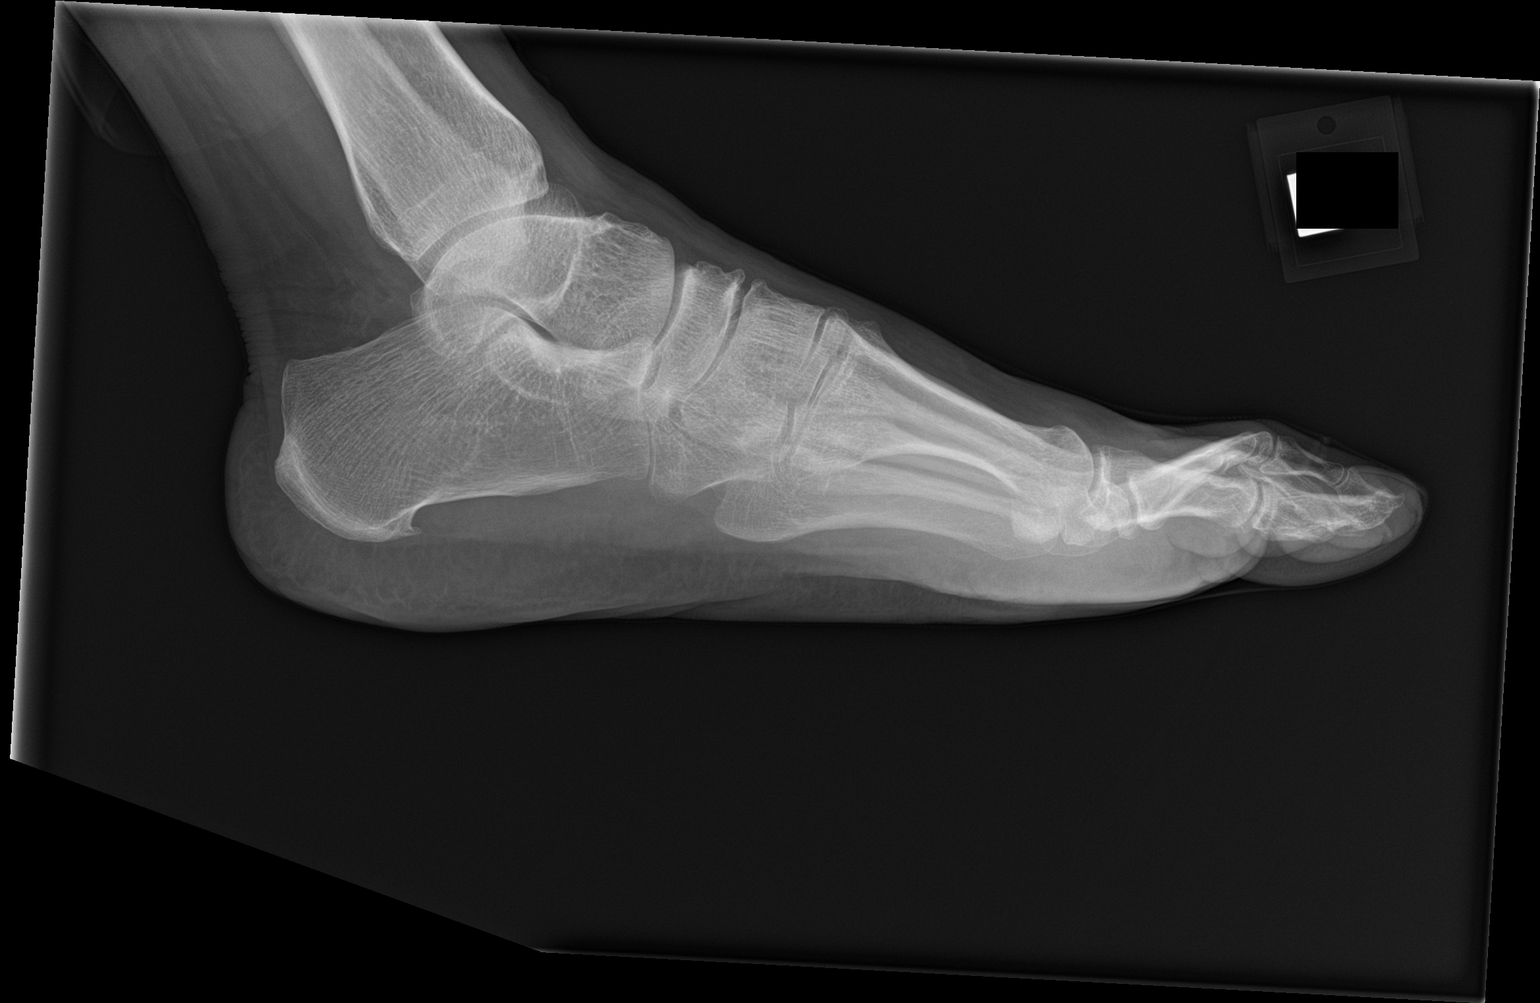

[3 of 3 positions shown; findings below may reference images not displayed]

FINDINGS: No acute soft tissue or bony abnormality. Mild calcaneal spurring.
No radiopaque foreign body. Symptoms persist MRI of the left foot
can be obtained.
IMPRESSION: No acute soft tissue bony abnormality.

## 2022-05-06 ENCOUNTER — Ambulatory Visit (HOSPITAL_COMMUNITY)
Admission: EM | Admit: 2022-05-06 | Discharge: 2022-05-06 | Disposition: A | Discharge status: Home / Self Care | Payer: Medicaid Other | Attending: Internal Medicine | Admitting: Internal Medicine

## 2022-05-06 ENCOUNTER — Encounter (HOSPITAL_COMMUNITY): Payer: Self-pay

## 2022-05-06 DIAGNOSIS — M545 Low back pain, unspecified: Secondary | ICD-10-CM

## 2022-05-06 DIAGNOSIS — R21 Rash and other nonspecific skin eruption: Secondary | ICD-10-CM

## 2022-05-06 DIAGNOSIS — G8929 Other chronic pain: Secondary | ICD-10-CM | POA: Diagnosis not present

## 2022-05-06 LAB — POCT URINALYSIS DIPSTICK, ED / UC
Bilirubin Urine: NEGATIVE
Glucose, UA: NEGATIVE mg/dL
Hgb urine dipstick: NEGATIVE
Ketones, ur: NEGATIVE mg/dL
Leukocytes,Ua: NEGATIVE
Nitrite: NEGATIVE
Protein, ur: NEGATIVE mg/dL
Specific Gravity, Urine: 1.02 (ref 1.005–1.030)
Urobilinogen, UA: 0.2 mg/dL (ref 0.0–1.0)
pH: 5 (ref 5.0–8.0)

## 2022-05-06 MED ORDER — FLUCONAZOLE 150 MG PO TABS: 150.0000 mg | ORAL_TABLET | ORAL | 0 refills | Status: AC

## 2022-05-06 MED ORDER — METHOCARBAMOL 500 MG PO TABS: 500.0000 mg | ORAL_TABLET | Freq: Two times a day (BID) | ORAL | 0 refills | Status: DC

## 2022-05-06 NOTE — Discharge Instructions (Signed)
Your urinalysis is negative for signs of urinary tract infection. I suspect your low back pain is likely due to a muscle spasm that will improve over time. I would like for you to use heat to the low back and perform gentle range of motion exercises to relieve pain. You may take prescribed muscle relaxer at bedtime as needed for muscle spasm.  Do not take this during the daytime as it can make you very sleepy. You may also take ibuprofen/Tylenol as needed for pain relief.  Please continue using clotrimazole cream to the rash. Take Diflucan pill once today, then again in 7 days to treat likely fungal infection to the buttocks. Follow-up with provider as scheduled in a couple of weeks for ongoing evaluation and management of your rash to the buttocks. You may find that your rash improves if you wear loosefitting clothing and prevent excessive moisture buildup to the area.  Return to urgent care as needed.

## 2022-05-06 NOTE — ED Provider Notes (Signed)
[place]    CSN: [id number] Arrival date & time: 05/06/22  1043      History   Chief Complaint Chief Complaint  Patient presents with   Rash    HPI Meghan Barrett is a 52 y.o. female.   Patient presents to urgent care for evaluation of rash to the buttocks that has been intermittent over the last several months. She states the rash appears every 6 months or so and usually responds well to clotrimazole ointment. Rash starts with itching to the skin, patient states this is how she knows the rash is there. Denies pain, redness, and drainage to the rash. No recent changes in laundry detergents or personal hygiene products. No recent bites to the buttocks or known contacts with similar rash. She was seen by PCP recently for rash who treated with valtrex, although they stated the rash was inconsistent with HSV.  She states clotrimazole helps the most with the rash. Reports working in hot environment and believes excessive moisture buildup to the buttock area contributes to the rash. Denies recent antibiotic use or steroid use. No fever/chills.  She would also like to be evaluated for low back pain to the right side that has been present for the last few days. She states she performs heavy lifting at work, but denies recent known injuries to the back, saddle anesthesia, urinary/stool incontinence, and numbness/tingling to the lower extremities. No nausea, vomiting, diarrhea, blood/mucous to the stools, constipation, or abdominal pain. No recent falls. She has been taking tylenol and ibuprofen without relief of low back discomfort. Denies urinary symptoms.      Past Medical History:  Diagnosis Date   Anxiety    Asthma    Depression    Diabetes mellitus ([place]) dx'd 06/19/2013   Hernia    High cholesterol    Hx of diabetes mellitus 01/27/2020   Schizophrenia [facility])     Patient Active Problem List   Diagnosis Date Noted   Hot flushes, perimenopausal 12/16/2021   Seasonal  allergies 12/16/2021   Persistent cough 12/16/2021   PCB (post coital bleeding) 09/11/2021   Screening for viral disease 09/11/2021   Screening examination for sexually transmitted disease 09/11/2021   Vesicular skin lesions 11/06/2020   Chronic pain of both knees 08/26/2020   Chronic bilateral low back pain without sciatica 08/26/2020   Hx of diabetes mellitus 01/27/2020   Gastroesophageal reflux disease 01/27/2020   Asthma in adult, moderate persistent, uncomplicated 0000000   Hyperlipidemia 08/17/2015   Schizophrenia ([place]) 06/19/2013    Past Surgical History:  Procedure Laterality Date   APPENDECTOMY  08-06-15   CESAREAN SECTION  1991; 1999; 2002   FINGER FRACTURE SURGERY Left 2010   TUBAL LIGATION  2002    OB History   No obstetric history on file.      Home Medications    Prior to Admission medications   Medication Sig Start Date End Date Taking? Authorizing Provider  fluconazole (DIFLUCAN) 150 MG tablet Take 1 tablet (150 mg total) by mouth every 7 (seven) days for 2 doses. 05/06/22 05/14/22 Yes Kyle Luppino, Stasia Cavalier, FNP  methocarbamol (ROBAXIN) 500 MG tablet Take 1 tablet (500 mg total) by mouth 2 (two) times daily. 05/06/22  Yes Talbot Grumbling, FNP  albuterol (VENTOLIN HFA) 108 (90 Base) MCG/ACT inhaler Inhale 2 puffs into the lungs every 6 (six) hours as needed for wheezing or shortness of breath. 03/24/21   Autry-Lott, Naaman Plummer, DO  cetirizine (ZYRTEC) 10 MG tablet TAKE 1  TABLET(10 MG) BY MOUTH DAILY 01/07/22   Eppie Gibson, MD  clotrimazole-betamethasone (LOTRISONE) cream Apply to affected area 2 times daily prn 07/02/21   Rondel Oh, MD  diclofenac Sodium (VOLTAREN) 1 % GEL Apply to left knee and left foot for pain 03/24/21   Autry-Lott, Naaman Plummer, DO  diclofenac Sodium (VOLTAREN) 1 % GEL Apply 2 g topically 4 (four) times daily. Rub into affected area of foot 2 to 4 times daily 12/16/21   Dameron, Luna Fuse, DO  famotidine (PEPCID) 10 MG tablet Take 1 tablet (10  mg total) by mouth 2 (two) times daily. 12/16/21   Dameron, Luna Fuse, DO  FLUoxetine (PROZAC) 20 MG capsule Take 20 mg by mouth daily. 12/02/19   [provider]  fluticasone (FLONASE) 50 MCG/ACT nasal spray Place 1 spray into both nostrils daily. 12/16/21   Dameron, Luna Fuse, DO  montelukast (SINGULAIR) 10 MG tablet TAKE 1 TABLET(10 MG) BY MOUTH AT BEDTIME 12/17/21   Eppie Gibson, MD  mupirocin ointment (BACTROBAN) 2 % Apply 1 application topically daily. 04/30/21   Raspet, Junie Panning K, PA-C  polyethylene glycol powder (GLYCOLAX/MIRALAX) 17 GM/SCOOP powder Take 17 g by mouth daily. 01/11/21   Autry-Lott, Naaman Plummer, DO  traZODone (DESYREL) 50 MG tablet Take by mouth. 03/06/20   [provider]  valACYclovir (VALTREX) 500 MG tablet TAKE 1 TABLET(500 MG) BY MOUTH TWICE DAILY 01/07/22   Eppie Gibson, MD    Family History Family History  Problem Relation Age of Onset   Alcohol abuse Mother    Drug abuse Mother    Diabetes Mother    Alcohol abuse Father    Drug abuse Father    Heart attack Father    Diabetes Father    Diabetes Sister    Early death Sister    High Cholesterol Sister    Cancer Other    Stroke Other     Social History Social History   Tobacco Use   Smoking status: Former    Years: 25.00    Types: Cigarettes   Smokeless tobacco: Never   Tobacco comments:    06/19/2013 "smoke ~ 5 cigarettes/month"  Vaping Use   Vaping Use: Never used  Substance Use Topics   Alcohol use: Not Currently    Alcohol/week: 1.0 standard drink of alcohol    Types: 1 Cans of beer per week   Drug use: Not Currently    Types: "Crack" cocaine    Comment: 06/19/2013 "got off crack cocaine almost 1 yr ago"     Allergies   Penicillins   Review of Systems Review of Systems Per HPI  Physical Exam Triage Vital Signs ED Triage Vitals  Enc Vitals Group     BP 05/06/22 1131 115/77     Pulse Rate 05/06/22 1131 61     Resp 05/06/22 1131 16     Temp 05/06/22 1131 (!) 97.3 F (36.3  C)     Temp Source 05/06/22 1131 Oral     SpO2 05/06/22 1131 98 %     Weight --      Height --      Head Circumference --      Peak Flow --      Pain Score 05/06/22 1132 0     Pain Loc --      Pain Edu? --      Excl. in [place]? --    No data found.  Updated Vital Signs BP 115/77 (BP Location: Right Arm)   Pulse 61  Temp (!) 97.3 F (36.3 C) (Oral)   Resp 16   SpO2 98%   Visual Acuity Right Eye Distance:   Left Eye Distance:   Bilateral Distance:    Right Eye Near:   Left Eye Near:    Bilateral Near:     Physical Exam Vitals and nursing note reviewed.  Constitutional:      Appearance: She is not ill-appearing or toxic-appearing.  HENT:     Head: Normocephalic and atraumatic.     Right Ear: Hearing, tympanic membrane, ear canal and external ear normal.     Left Ear: Hearing, tympanic membrane, ear canal and external ear normal.     Nose: Nose normal.     Mouth/Throat:     Lips: Pink.     Mouth: Mucous membranes are moist. No injury.     Tongue: No lesions. Tongue does not deviate from midline.     Palate: No mass and lesions.     Pharynx: Oropharynx is clear. Uvula midline. No pharyngeal swelling, oropharyngeal exudate, posterior oropharyngeal erythema or uvula swelling.     Tonsils: No tonsillar exudate or tonsillar abscesses.  Eyes:     General: Lids are normal. Vision grossly intact. Gaze aligned appropriately.     Extraocular Movements: Extraocular movements intact.     Conjunctiva/sclera: Conjunctivae normal.  Cardiovascular:     Rate and Rhythm: Normal rate and regular rhythm.     Heart sounds: Normal heart sounds, S1 normal and S2 normal.  Pulmonary:     Effort: Pulmonary effort is normal. No respiratory distress.     Breath sounds: Normal breath sounds and air entry.  Abdominal:     Palpations: Abdomen is soft.     Tenderness: There is no right CVA tenderness or left CVA tenderness.  Musculoskeletal:     Cervical back: Normal and neck supple.      Thoracic back: Normal.     Lumbar back: Tenderness present. No swelling, edema, deformity, signs of trauma, lacerations, spasms or bony tenderness. Normal range of motion. Negative right straight leg raise test and negative left straight leg raise test. No scoliosis.     Right lower leg: No edema.     Left lower leg: No edema.     Comments: TTP to the right sided upper lumbar paraspinals, no deformities, decreased ROM, midline tenderness, or extremity weakness. Sensation and strength intact to bilateral lower extremities.   Skin:    General: Skin is warm and dry.     Capillary Refill: Capillary refill takes less than 2 seconds.     Findings: Rash present.          Comments: Vesicular and splotchy rash to the upper right gluteal cleft. No erythema, drainage, or signs of excoriation. No warmth. No underlying soft-tissue swelling or induration. See images below for detail.  Rash is localized to the buttocks and is not to anywhere else on the body.  Neurological:     General: No focal deficit present.     Mental Status: She is alert and oriented to person, place, and time. Mental status is at baseline.     Cranial Nerves: No cranial nerve deficit, dysarthria or facial asymmetry.     Sensory: No sensory deficit.     Motor: No weakness.     Coordination: Coordination normal.     Gait: Gait normal.     Comments: Strength and sensation intact to bilateral lower extremities.   Psychiatric:        Mood  and Affect: Mood normal.        Speech: Speech normal.        Behavior: Behavior normal.        Thought Content: Thought content normal.        Judgment: Judgment normal.      UC Treatments / Results  Labs (all labs ordered are listed, but only abnormal results are displayed) Labs Reviewed  POCT URINALYSIS DIPSTICK, ED / UC    EKG   Radiology No results found.  Procedures Procedures (including critical care time)  Medications Ordered in UC Medications - No data to  display  Initial Impression / Assessment and Plan / UC Course  I have reviewed the triage vital signs and the nursing notes.  Pertinent labs & imaging results that were available during my care of the patient were reviewed by me and considered in my medical decision making (see chart for details).   1.  Rash and nonspecific skin eruption Rash has responded well to clotrimazole in the past, therefore we will continue clotrimazole use twice daily for the next 7 days.  Will add on Diflucan once weekly for the next 2 weeks to further treat likely candidal infection to the buttock.  She has a follow-up appointment with her primary care provider in the next couple of weeks.  I would like for her to continue to follow-up with PCP for ongoing evaluation and management of rash.  Rash does not appear to be bacterial in nature.  No indication for antibiotic therapy at this time.  Rash is not spreading and is localized to the buttocks.  Advised patient to wear loosefitting clothing to further reduce moisture to the area.  2.  Chronic right-sided low back pain without sciatica Presentation is consistent with muscular pain to the low back.  Muscle relaxer sent to pharmacy to be taken as prescribed, drowsiness precautions discussed.  May continue taking Tylenol 1000 mg every 6 hours as needed for pain.  Gentle range of motion exercises and heat to the low back recommended.  No red flag signs or symptoms indicating need for emergent referral to the ED for imaging.  Deferred imaging based on atraumatic mechanism of injury and stable musculoskeletal exam findings.  She is agreeable with this plan.  Urinalysis is unremarkable for signs of urinary tract infection.   Discussed physical exam and available lab work findings in clinic with patient.  Counseled patient regarding appropriate use of medications and potential side effects for all medications recommended or prescribed today. Discussed red flag signs and symptoms of  worsening condition,when to call the PCP office, return to urgent care, and when to seek higher level of care in the emergency department. Patient verbalizes understanding and agreement with plan. All questions answered. Patient discharged in stable condition.    Final Clinical Impressions(s) / UC Diagnoses   Final diagnoses:  Rash and nonspecific skin eruption  Chronic right-sided low back pain without sciatica     Discharge Instructions      Your urinalysis is negative for signs of urinary tract infection. I suspect your low back pain is likely due to a muscle spasm that will improve over time. I would like for you to use heat to the low back and perform gentle range of motion exercises to relieve pain. You may take prescribed muscle relaxer at bedtime as needed for muscle spasm.  Do not take this during the daytime as it can make you very sleepy. You may also take ibuprofen/Tylenol as  needed for pain relief.  Please continue using clotrimazole cream to the rash. Take Diflucan pill once today, then again in 7 days to treat likely fungal infection to the buttocks. Follow-up with provider as scheduled in a couple of weeks for ongoing evaluation and management of your rash to the buttocks. You may find that your rash improves if you wear loosefitting clothing and prevent excessive moisture buildup to the area.  Return to urgent care as needed.    ED Prescriptions     Medication Sig Dispense Auth. Provider   fluconazole (DIFLUCAN) 150 MG tablet Take 1 tablet (150 mg total) by mouth every 7 (seven) days for 2 doses. 2 tablet Talbot Grumbling, FNP   methocarbamol (ROBAXIN) 500 MG tablet Take 1 tablet (500 mg total) by mouth 2 (two) times daily. 20 tablet Talbot Grumbling, FNP      PDMP not reviewed this encounter.   Talbot Grumbling, [place] 05/06/22 1521

## 2022-05-06 NOTE — ED Triage Notes (Signed)
Patient reports that she has a rash on the left buttock. Patient states she went to her PCP and was given a prescription for Valacyclovir and a cream( Clotrimazole and Betamethasone) Patient states she was told that she did not have Herpes.  Patient states she has had pain in the right lower back  x 2 days.

## 2022-05-12 ENCOUNTER — Encounter: Payer: Medicaid Other | Admitting: Student

## 2022-05-13 ENCOUNTER — Encounter: Payer: Self-pay | Admitting: Student

## 2022-05-13 ENCOUNTER — Other Ambulatory Visit (HOSPITAL_COMMUNITY)
Admission: RE | Admit: 2022-05-13 | Discharge: 2022-05-13 | Disposition: A | Discharge status: Home / Self Care | Payer: Medicaid Other | Source: Ambulatory Visit | Attending: Family Medicine | Admitting: Family Medicine

## 2022-05-13 ENCOUNTER — Ambulatory Visit (INDEPENDENT_AMBULATORY_CARE_PROVIDER_SITE_OTHER): Payer: Medicaid Other | Admitting: Student

## 2022-05-13 VITALS — BP 118/80 | HR 81 | Ht 66.0 in | Wt 148.4 lb

## 2022-05-13 DIAGNOSIS — Z113 Encounter for screening for infections with a predominantly sexual mode of transmission: Secondary | ICD-10-CM

## 2022-05-13 DIAGNOSIS — N949 Unspecified condition associated with female genital organs and menstrual cycle: Secondary | ICD-10-CM | POA: Diagnosis not present

## 2022-05-13 DIAGNOSIS — N951 Menopausal and female climacteric states: Secondary | ICD-10-CM | POA: Diagnosis not present

## 2022-05-13 DIAGNOSIS — Z8639 Personal history of other endocrine, nutritional and metabolic disease: Secondary | ICD-10-CM | POA: Diagnosis not present

## 2022-05-13 DIAGNOSIS — Z79899 Other long term (current) drug therapy: Secondary | ICD-10-CM | POA: Diagnosis not present

## 2022-05-13 DIAGNOSIS — E785 Hyperlipidemia, unspecified: Secondary | ICD-10-CM | POA: Diagnosis not present

## 2022-05-13 DIAGNOSIS — R21 Rash and other nonspecific skin eruption: Secondary | ICD-10-CM | POA: Diagnosis not present

## 2022-05-13 DIAGNOSIS — F209 Schizophrenia, unspecified: Secondary | ICD-10-CM | POA: Diagnosis not present

## 2022-05-13 LAB — POCT WET PREP (WET MOUNT)
Clue Cells Wet Prep Whiff POC: NEGATIVE
Trichomonas Wet Prep HPF POC: ABSENT
WBC, Wet Prep HPF POC: NONE SEEN

## 2022-05-13 NOTE — Progress Notes (Signed)
    SUBJECTIVE:   Chief compliant/HPI: "Bumps on butt"  Meghan Barrett is a 52 y.o. who presents today for an annual exam. Hx of DM2, now in remission. Schizophrenia, Asthma.   Bumps on butt every 4-6 weeks. Got "fungus stuff" and getting better.  Looks like [place] thought may have been HSV a few years back and treated empirically with Valtrex though she has never had a swab done for HSV.  Saw UC for this earlier this week where it was thought to be fungal--treated with clotrimazole and PO diflucan. Has since gone away. Was never painful, just itchy.   Pelvic Pain  Vaginal Discharge Present x2 days. Low pelvic pain and white vaginal discharge. No particular odor associated with discharge. +new sexual partner in the past month or so. Of note, recently treated with diflucan for rash as above.   Healthcare maintenance Due for physical. Scheduled for a few weeks from now. Will obtain labs today to discuss at physical. Patient specifically wants to check Vitamin D level as she has heard that this may be associated with menopause symptoms.  Unable to complete full med rec today as uncertain exactly what she takes.    OBJECTIVE:   BP 118/80   Pulse 81   Ht 5' 6"$  (1.676 m)   Wt 148 lb 6.4 oz (67.3 kg)   SpO2 98%   BMI 23.95 kg/m   Physical Exam Vitals reviewed. Exam conducted with a chaperone present.  Constitutional:      General: She is not in acute distress. Cardiovascular:     Rate and Rhythm: Normal rate and regular rhythm.     Heart sounds: No murmur heard. Genitourinary:    Vagina: Normal. No signs of injury. No vaginal discharge.     Cervix: No discharge or lesion.     Uterus: Normal.      Adnexa: Right adnexa normal and left adnexa normal.  Skin:    General: Skin is warm and dry.     Findings: No rash.  Psychiatric:        Mood and Affect: Affect is flat.        Speech: Speech is delayed.        Behavior: Behavior normal. Behavior is cooperative.       ASSESSMENT/PLAN:   Vaginal discomfort Wet prep negative. Possible she had some yeast that was treated with diflucan when she took it for her rash. Will send UA, GC/Chlam, and full STI serum testing per patient request.   Rash Has resolved with antifungal therapy. Given that she did not respond to valtrex and was never diagnosed with HSV, I think it reasonable to remove valtrex from med list.   Schizophrenia ([place]) Presumably on antipsychotic meds, she tells me she is on two agents but can't tell me what they are. At risk for metabolic disease secondary to long-term antipsychotic use. Will obtain metabolic labs ahead of physical in a few weeks. - Records request faxed to [facility] - A1c, Lipids, CMP - Patient to bring all meds to physical in a few weeks      [name], MD [place]

## 2022-05-13 NOTE — Assessment & Plan Note (Addendum)
Wet prep negative. Possible she had some yeast that was treated with diflucan when she took it for her rash. Will send UA, GC/Chlam, and full STI serum testing per patient request.

## 2022-05-13 NOTE — Assessment & Plan Note (Signed)
Presumably on antipsychotic meds, she tells me she is on two agents but can't tell me what they are. At risk for metabolic disease secondary to long-term antipsychotic use. Will obtain metabolic labs ahead of physical in a few weeks. - Records request faxed to [facility] - A1c, Lipids, CMP - Patient to bring all meds to physical in a few weeks

## 2022-05-13 NOTE — Patient Instructions (Addendum)
Meghan Barrett,   We are checking some labs on you today. We can discuss any changes that we need to make when I see you back for your physical next month.  I'm glad your rash got better.  I am sending a records request to [facility] to see if we can figure out what you're on for your mental health. Please also bring all of your meds in with you for your physical!   [name], MD

## 2022-05-13 NOTE — Assessment & Plan Note (Signed)
Has resolved with antifungal therapy. Given that she did not respond to valtrex and was never diagnosed with HSV, I think it reasonable to remove valtrex from med list.

## 2022-05-14 LAB — HIV ANTIBODY (ROUTINE TESTING W REFLEX): HIV Screen 4th Generation wRfx: NONREACTIVE

## 2022-05-14 LAB — LIPID PANEL
Chol/HDL Ratio: 3.5 ratio (ref 0.0–4.4)
Cholesterol, Total: 237 mg/dL — ABNORMAL HIGH (ref 100–199)
HDL: 67 mg/dL (ref >39)
LDL Chol Calc (NIH): 155 mg/dL — ABNORMAL HIGH (ref 0–99)
Triglycerides: 88 mg/dL (ref 0–149)
VLDL Cholesterol Cal: 15 mg/dL (ref 5–40)

## 2022-05-14 LAB — COMPREHENSIVE METABOLIC PANEL
ALT: 20 IU/L (ref 0–32)
AST: 25 IU/L (ref 0–40)
Albumin/Globulin Ratio: 1.7 (ref 1.2–2.2)
Albumin: 4.6 g/dL (ref 3.9–4.9)
Alkaline Phosphatase: 77 IU/L (ref 44–121)
BUN/Creatinine Ratio: 11 (ref 9–23)
BUN: 11 mg/dL (ref 6–24)
Bilirubin Total: 0.6 mg/dL (ref 0.0–1.2)
CO2: 23 mmol/L (ref 20–29)
Calcium: 9.7 mg/dL (ref 8.7–10.2)
Chloride: 102 mmol/L (ref 96–106)
Creatinine, Ser: 1.04 mg/dL — ABNORMAL HIGH (ref 0.57–1.00)
Globulin, Total: 2.7 g/dL (ref 1.5–4.5)
Glucose: 97 mg/dL (ref 70–99)
Potassium: 4.4 mmol/L (ref 3.5–5.2)
Sodium: 143 mmol/L (ref 134–144)
Total Protein: 7.3 g/dL (ref 6.0–8.5)
eGFR: 66 mL/min/{1.73_m2} (ref >59)

## 2022-05-14 LAB — URINALYSIS
Bilirubin, UA: NEGATIVE
Glucose, UA: NEGATIVE
Ketones, UA: NEGATIVE
Leukocytes,UA: NEGATIVE
Nitrite, UA: NEGATIVE
Protein,UA: NEGATIVE
RBC, UA: NEGATIVE
Specific Gravity, UA: 1.019 (ref 1.005–1.030)
Urobilinogen, Ur: 0.2 mg/dL (ref 0.2–1.0)
pH, UA: 6 (ref 5.0–7.5)

## 2022-05-14 LAB — VITAMIN D 25 HYDROXY (VIT D DEFICIENCY, FRACTURES): Vit D, 25-Hydroxy: 8.5 ng/mL — ABNORMAL LOW (ref 30.0–100.0)

## 2022-05-14 LAB — HCV INTERPRETATION

## 2022-05-14 LAB — HCV AB W REFLEX TO QUANT PCR: HCV Ab: NONREACTIVE

## 2022-05-14 LAB — RPR: RPR Ser Ql: NONREACTIVE

## 2022-05-14 LAB — HEMOGLOBIN A1C
Est. average glucose Bld gHb Est-mCnc: 128 mg/dL
Hgb A1c MFr Bld: 6.1 % — ABNORMAL HIGH (ref 4.8–5.6)

## 2022-05-16 LAB — CERVICOVAGINAL ANCILLARY ONLY
Chlamydia: NEGATIVE
Comment: NEGATIVE
Comment: NEGATIVE
Comment: NORMAL
Neisseria Gonorrhea: NEGATIVE
Trichomonas: NEGATIVE

## 2022-06-24 ENCOUNTER — Ambulatory Visit (INDEPENDENT_AMBULATORY_CARE_PROVIDER_SITE_OTHER): Payer: Medicaid Other | Admitting: Student

## 2022-06-24 ENCOUNTER — Other Ambulatory Visit: Payer: Self-pay | Admitting: Student

## 2022-06-24 ENCOUNTER — Encounter: Payer: Self-pay | Admitting: Student

## 2022-06-24 VITALS — BP 102/68 | HR 71 | Ht 66.0 in | Wt 151.2 lb

## 2022-06-24 DIAGNOSIS — Z1231 Encounter for screening mammogram for malignant neoplasm of breast: Secondary | ICD-10-CM

## 2022-06-24 DIAGNOSIS — N898 Other specified noninflammatory disorders of vagina: Secondary | ICD-10-CM

## 2022-06-24 DIAGNOSIS — R7303 Prediabetes: Secondary | ICD-10-CM

## 2022-06-24 DIAGNOSIS — Z1211 Encounter for screening for malignant neoplasm of colon: Secondary | ICD-10-CM | POA: Diagnosis not present

## 2022-06-24 LAB — POCT WET PREP (WET MOUNT): Clue Cells Wet Prep Whiff POC: POSITIVE

## 2022-06-24 MED ORDER — FLUTICASONE PROPIONATE 50 MCG/ACT NA SUSP: 1.0000 | Freq: Every day | NASAL | 1 refills | Status: DC

## 2022-06-24 MED ORDER — METRONIDAZOLE 500 MG PO TABS: 500.0000 mg | ORAL_TABLET | Freq: Two times a day (BID) | ORAL | 0 refills | Status: AC

## 2022-06-24 MED ORDER — CETIRIZINE HCL 10 MG PO TABS: ORAL_TABLET | ORAL | 2 refills | Status: DC

## 2022-06-24 MED ORDER — ROSUVASTATIN CALCIUM 10 MG PO TABS: 10.0000 mg | ORAL_TABLET | Freq: Every day | ORAL | 11 refills | Status: DC

## 2022-06-24 NOTE — Progress Notes (Signed)
Called to inform patient she has Trichomonas and that Rx was sent in to be taken twice a day for 7 days. Patient understood and agreeable.

## 2022-06-24 NOTE — Patient Instructions (Addendum)
It was great to see you! Thank you for allowing me to participate in your care!  I recommend that you always bring your medications to each appointment as this makes it easy to ensure we are on the correct medications and helps Meghan Barrett not miss when refills are needed.  Our plans for today:  - Vaginal Irritation  Pelvic exam and testing. Will call if results are abnormal.  - High Cholesterol  Starting a medication called Crestor, take daily  Crestor 10 mg  - Colonoscopy  You are due for a colonoscopy  - Mammogram   You are due for a mammogram   - Prediabetes Your A1c was 6.1 which is elevated to the prediabetic range. It doesn't mean you have diabetes, but it does mean that things are heading that way. You will want to change your diet and exercise more  Eat less fried food and less food that are carbohydrates (Rice, Pasta, Potatoes)  Drink less sugary beverages (sweet tea, juice, soda)  Eat more vegetables and protein   Schedule appointment with our dietician, at your convenience    Dr. Danie Chandler    Phone: [phone]   - Arm numbness and pain  Make a follow up appointment to be seen for this issue. We weren't able to discuss this today.   We are checking some labs today, I will call you if they are abnormal will send you a MyChart message or a letter if they are normal.  If you do not hear about your labs in the next 2 weeks please let Meghan Barrett know.  Take care and seek immediate care sooner if you develop any concerns.   Dr. Bess Kinds, MD [facility]
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
## 2022-06-28 DIAGNOSIS — R7303 Prediabetes: Secondary | ICD-10-CM | POA: Insufficient documentation

## 2022-06-28 NOTE — Assessment & Plan Note (Signed)
Patient notes increased vaginal discharge and odor since recent sexual encounter with her partner. Reports they did not use protection and he used saliva for lubrication. She denies any burning/itching. She was recently tested for STI's and reports her and her partner have been consistent since.  -Wet prep (trich, candida, BV)

## 2022-06-28 NOTE — Assessment & Plan Note (Signed)
Patient A1c 6.1, elected for diet and exercise. Reviewed diet and patient wanting to meet with clinc RD -Ref to RD -Rpt A1c 3 mon -Reviewed healthy diet

## 2022-07-01 ENCOUNTER — Encounter (HOSPITAL_COMMUNITY): Payer: Self-pay

## 2022-07-01 ENCOUNTER — Ambulatory Visit (HOSPITAL_COMMUNITY)
Admission: EM | Admit: 2022-07-01 | Discharge: 2022-07-01 | Disposition: A | Discharge status: Home / Self Care | Payer: Medicaid Other | Attending: Internal Medicine | Admitting: Internal Medicine

## 2022-07-01 DIAGNOSIS — S29011A Strain of muscle and tendon of front wall of thorax, initial encounter: Secondary | ICD-10-CM

## 2022-07-01 MED ORDER — ACETAMINOPHEN 325 MG PO TABS
975.0000 mg | ORAL_TABLET | Freq: Once | ORAL | Status: AC
  Administered 2022-07-01: 975 mg via ORAL

## 2022-07-01 MED ORDER — METHOCARBAMOL 500 MG PO TABS: 500.0000 mg | ORAL_TABLET | Freq: Two times a day (BID) | ORAL | 0 refills | Status: DC

## 2022-07-01 MED ORDER — ACETAMINOPHEN 325 MG PO TABS
ORAL_TABLET | ORAL | Status: AC
Start: 2022-07-01 — End: ?
  Filled 2022-07-01: qty 3

## 2022-07-01 NOTE — ED Provider Notes (Signed)
[facility]    CSN: [id number] Arrival date & time: 07/01/22  1445      History   Chief Complaint Chief Complaint  Patient presents with   Chest Pain    HPI Meghan Barrett is a 52 y.o. female.   Patient with history of asthma, type 2 diabetes, anxiety, hyperlipidemia, and schizophrenia presents to urgent care for evaluation of chest pain to the left side of the chest starting yesterday at 2pm (approximately 26 hours ago).  Chest pain is worse with bending forward and does not travel down her left arm or into her left jaw. Chest pain comes and goes, she is not currently experiencing pain. She thought pain may be related to heart burn, but states she has changed her diet and does not eat greasy foods anymore. No nausea, vomiting, abdominal pain, back pain, heart palpitations, shortness of breath, dizziness, headache, or recent diarrhea. History of asthma, no cough/wheezing. Has not used her albuterol inhaler. Non-smoker, denies drug use. She is worried that the chest pain may be due to recent medication changes. She started taking flagyl for STD 6 days ago and Crestor for high cholesterol 5 days ago.  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  Denies recent known trauma or injury to the chest wall.  No history of heart attack or heart problems in the past.  No orthopnea or leg swelling.   Chest Pain   Past Medical History:  Diagnosis Date   Anxiety    Asthma    Depression    Diabetes mellitus dx'd 06/19/2013   Hernia    High cholesterol    Hx of diabetes mellitus 01/27/2020   Schizophrenia     Patient Active Problem List   Diagnosis Date Noted   Prediabetes 06/28/2022   Vaginal discomfort 05/13/2022   Hot flushes, perimenopausal 12/16/2021   Rash 11/06/2020   Vaginal discharge 05/22/2020   Hx of diabetes mellitus 01/27/2020   Gastroesophageal reflux disease 01/27/2020   Asthma in adult, moderate persistent,  uncomplicated 12/13/2019   Hyperlipidemia 08/17/2015   Schizophrenia 06/19/2013    Past Surgical History:  Procedure Laterality Date   APPENDECTOMY  08-06-15   CESAREAN SECTION  1991; 1999; 2002   FINGER FRACTURE SURGERY Left 2010   TUBAL LIGATION  2002    OB History   No obstetric history on file.      Home Medications    Prior to Admission medications   Medication Sig Start Date End Date Taking? Authorizing Provider  albuterol (VENTOLIN HFA) 108 (90 Base) MCG/ACT inhaler Inhale 2 puffs into the lungs every 6 (six) hours as needed for wheezing or shortness of breath. 03/24/21   Autry-Lott, Randa Evens, DO  cetirizine (ZYRTEC) 10 MG tablet Take one pill daily 06/24/22   Bess Kinds, MD  clotrimazole-betamethasone (LOTRISONE) cream Apply to affected area 2 times daily prn 07/02/21   Jannifer Franklin, MD  diclofenac Sodium (VOLTAREN) 1 % GEL Apply to left knee and left foot for pain 03/24/21   Autry-Lott, Randa Evens, DO  diclofenac Sodium (VOLTAREN) 1 % GEL Apply 2 g topically 4 (four) times daily. Rub into affected area of foot 2 to 4 times daily 12/16/21   Dameron, Nolberto Hanlon, DO  famotidine (PEPCID) 10 MG tablet Take 1 tablet (10 mg total) by mouth 2 (two) times daily. 12/16/21   Dameron, Nolberto Hanlon, DO  FLUoxetine (PROZAC) 20 MG capsule Take 20 mg by mouth daily. 12/02/19  [provider]  fluticasone (FLONASE) 50 MCG/ACT nasal spray Place 1 spray into both nostrils daily. 06/24/22   Bess Kinds, MD  methocarbamol (ROBAXIN) 500 MG tablet Take 1 tablet (500 mg total) by mouth 2 (two) times daily. 07/01/22   Carlisle Beers, FNP  metroNIDAZOLE (FLAGYL) 500 MG tablet Take 1 tablet (500 mg total) by mouth 2 (two) times daily for 7 days. 06/24/22 07/01/22  Bess Kinds, MD  montelukast (SINGULAIR) 10 MG tablet TAKE 1 TABLET(10 MG) BY MOUTH AT BEDTIME 12/17/21   Alicia Amel, MD  mupirocin ointment (BACTROBAN) 2 % Apply 1 application topically daily. 04/30/21   Raspet, Denny Peon K, PA-C   polyethylene glycol powder (GLYCOLAX/MIRALAX) 17 GM/SCOOP powder Take 17 g by mouth daily. 01/11/21   Autry-Lott, Randa Evens, DO  rosuvastatin (CRESTOR) 10 MG tablet Take 1 tablet (10 mg total) by mouth daily. 06/24/22 06/24/23  Bess Kinds, MD  traZODone (DESYREL) 50 MG tablet Take by mouth. 03/06/20   [provider]    Family History Family History  Problem Relation Age of Onset   Alcohol abuse Mother    Drug abuse Mother    Diabetes Mother    Alcohol abuse Father    Drug abuse Father    Heart attack Father    Diabetes Father    Diabetes Sister    Early death Sister    High Cholesterol Sister    Cancer Other    Stroke Other     Social History Social History   Tobacco Use   Smoking status: Former    Years: 25    Types: Cigarettes   Smokeless tobacco: Never   Tobacco comments:    06/19/2013 "smoke ~ 5 cigarettes/month"  Vaping Use   Vaping Use: Never used  Substance Use Topics   Alcohol use: Not Currently    Alcohol/week: 1.0 standard drink of alcohol    Types: 1 Cans of beer per week   Drug use: Not Currently    Types: "Crack" cocaine    Comment: 06/19/2013 "got off crack cocaine almost 1 yr ago"     Allergies   Penicillins   Review of Systems Review of Systems  Cardiovascular:  Positive for chest pain.  Per HPI   Physical Exam Triage Vital Signs ED Triage Vitals  Enc Vitals Group     BP 07/01/22 1530 100/63     Pulse Rate 07/01/22 1530 76     Resp 07/01/22 1530 18     Temp 07/01/22 1530 97.8 F (36.6 C)     Temp Source 07/01/22 1530 Oral     SpO2 07/01/22 1530 96 %     Weight --      Height --      Head Circumference --      Peak Flow --      Pain Score 07/01/22 1531 0     Pain Loc --      Pain Edu? --      Excl. in GC? --    No data found.  Updated Vital Signs BP 100/63 (BP Location: Right Arm)   Pulse 76   Temp 97.8 F (36.6 C) (Oral)   Resp 18   SpO2 96%   Visual Acuity Right Eye Distance:   Left Eye Distance:   Bilateral  Distance:    Right Eye Near:   Left Eye Near:    Bilateral Near:     Physical Exam Vitals and nursing note reviewed.  Constitutional:  Appearance: She is not ill-appearing or toxic-appearing.  HENT:     Head: Normocephalic and atraumatic.     Right Ear: Hearing and external ear normal.     Left Ear: Hearing and external ear normal.     Nose: Nose normal.     Mouth/Throat:     Lips: Pink.  Eyes:     General: Lids are normal. Vision grossly intact. Gaze aligned appropriately.     Extraocular Movements: Extraocular movements intact.     Conjunctiva/sclera: Conjunctivae normal.  Cardiovascular:     Rate and Rhythm: Normal rate and regular rhythm.     Heart sounds: Normal heart sounds, S1 normal and S2 normal.  Pulmonary:     Effort: Pulmonary effort is normal. No respiratory distress.     Breath sounds: Normal breath sounds and air entry. No wheezing, rhonchi or rales.     Comments: Chest pain described as reproducible to palpation of the left chest wall/left inferior clavicular region. Chest:     Chest wall: Tenderness present.  Musculoskeletal:     Cervical back: Neck supple. No tenderness.     Right lower leg: No edema.     Left lower leg: No edema.  Lymphadenopathy:     Cervical: No cervical adenopathy.  Skin:    General: Skin is warm and dry.     Capillary Refill: Capillary refill takes less than 2 seconds.     Findings: No rash.  Neurological:     General: No focal deficit present.     Mental Status: She is alert and oriented to person, place, and time. Mental status is at baseline.     Cranial Nerves: No dysarthria or facial asymmetry.  Psychiatric:        Mood and Affect: Mood normal.        Speech: Speech normal.        Behavior: Behavior normal.        Thought Content: Thought content normal.        Judgment: Judgment normal.      UC Treatments / Results  Labs (all labs ordered are listed, but only abnormal results are displayed) Labs Reviewed - No  data to display  EKG   Radiology No results found.  Procedures Procedures (including critical care time)  Medications Ordered in UC Medications  acetaminophen (TYLENOL) tablet 975 mg (975 mg Oral Given 07/01/22 1624)    Initial Impression / Assessment and Plan / UC Course  I have reviewed the triage vital signs and the nursing notes.  Pertinent labs & imaging results that were available during my care of the patient were reviewed by me and considered in my medical decision making (see chart for details).   1.  Muscle strain of chest wall Presentation is consistent with acute muscle strain of the chest wall as symptoms are much worse with movement and described the chest pain is reproducible to palpation of the left chest wall.  Lungs are clear and vitals are hemodynamically stable, therefore deferred imaging.  Blood pressure is slightly soft at baseline.  Neurologically intact to baseline without unilateral weakness.  EKG shows normal sinus rhythm without ST/T wave changes concerning for ACS.  No indication for referral to the emergency department at this time for further workup due to low suspicion for acute cardiac cause of chest discomfort.  Low suspicion for medication reaction etiology. May use Tylenol 1000 mg every 6 hours as needed for chest pain.  May use Robaxin muscle relaxer every 12  hours as needed for chest wall discomfort, drowsiness precautions discussed.  Advised PCP follow-up in the next 1 to 2 weeks to ensure symptoms have improved.  Heat and gentle range of motion exercises recommended.  Symptoms are likely exacerbated by heavy physical activity demands by her job. Work note given.  Discussed physical exam and available lab work findings in clinic with patient.  Counseled patient regarding appropriate use of medications and potential side effects for all medications recommended or prescribed today. Discussed red flag signs and symptoms of worsening condition,when to call  the PCP office, return to urgent care, and when to seek higher level of care in the emergency department. Patient verbalizes understanding and agreement with plan. All questions answered. Patient discharged in stable condition.    Final Clinical Impressions(s) / UC Diagnoses   Final diagnoses:  Muscle strain of chest wall, initial encounter     Discharge Instructions      Your pain is likely due to a muscle strain which will improve on its own with time.   - Tylenol 1,000mg  every 6 hours as needed for pain. - You may also take the prescribed muscle relaxer as directed as needed for muscle aches/spasm.  Do not take this medication and drive or drink alcohol as it can make you sleepy.  Mainly use this medicine at nighttime as needed. - Apply heat 20 minutes on then 20 minutes off and perform gentle range of motion exercises to the area of greatest pain to prevent muscle stiffness and provide further pain relief.   Red flag symptoms to watch out for are numbness/tingling to the legs, weakness, loss of bowel/bladder control, and/or worsening pain that does not respond well to medicines. Follow-up with your primary care provider or return to urgent care if your symptoms do not improve in the next 3 to 4 days with medications and interventions recommended today. If your symptoms are severe (red flag), please go to the emergency room.  I hope you feel better!      ED Prescriptions     Medication Sig Dispense Auth. Provider   methocarbamol (ROBAXIN) 500 MG tablet Take 1 tablet (500 mg total) by mouth 2 (two) times daily. 20 tablet Carlisle Beers, FNP      PDMP not reviewed this encounter.   Carlisle Beers, [place] 07/01/22 [phone]

## 2022-07-01 NOTE — Discharge Instructions (Addendum)
Your pain is likely due to a muscle strain which will improve on its own with time.   - Tylenol 1,000mg  every 6 hours as needed for pain. - You may also take the prescribed muscle relaxer as directed as needed for muscle aches/spasm.  Do not take this medication and drive or drink alcohol as it can make you sleepy.  Mainly use this medicine at nighttime as needed. - Apply heat 20 minutes on then 20 minutes off and perform gentle range of motion exercises to the area of greatest pain to prevent muscle stiffness and provide further pain relief.   Red flag symptoms to watch out for are numbness/tingling to the legs, weakness, loss of bowel/bladder control, and/or worsening pain that does not respond well to medicines. Follow-up with your primary care provider or return to urgent care if your symptoms do not improve in the next 3 to 4 days with medications and interventions recommended today. If your symptoms are severe (red flag), please go to the emergency room.  I hope you feel better!

## 2022-07-01 NOTE — ED Triage Notes (Signed)
Pt c/o lt arm pain/tingling/numbness a week ago and her PCP started her on Crestor. States now pain going to her center to lt side of chest. States she feels like its her medicines that she just started.

## 2022-07-06 ENCOUNTER — Encounter: Payer: Self-pay | Admitting: Internal Medicine

## 2022-07-08 ENCOUNTER — Ambulatory Visit: Payer: Medicaid Other | Admitting: Student

## 2022-07-08 NOTE — Progress Notes (Deleted)
  SUBJECTIVE:   CHIEF COMPLAINT / HPI:   F/u hand numbness   HLD No statin needed, risk is 0.7%  HM Colonoscopy due   PERTINENT  PMH / PSH: ***  Past Medical History:  Diagnosis Date   Anxiety    Asthma    Depression    Diabetes mellitus dx'd 06/19/2013   Hernia    High cholesterol    Hx of diabetes mellitus 01/27/2020   Schizophrenia     Patient Care Team: Alicia Amel, MD as PCP - General (Family Medicine) OBJECTIVE:  There were no vitals taken for this visit. Physical Exam   ASSESSMENT/PLAN:  There are no diagnoses linked to this encounter. No follow-ups on file. Bess Kinds, MD 07/08/2022, 9:32 AM PGY-***, [facility] {    This will disappear when note is signed, click to select method of visit    :1}

## 2022-07-14 ENCOUNTER — Telehealth: Payer: Self-pay

## 2022-07-14 NOTE — Telephone Encounter (Signed)
Patient re schecduled PV 07/29/22

## 2022-07-14 NOTE — Telephone Encounter (Signed)
Called twice to speak with patient about upcoming colonoscopy.  Left 2 voicemail's, letting patient know that the pre visit was missed and that she needed to call back and reschedule the PV by 5pm or the colonoscopy would be cancelled.

## 2022-07-15 ENCOUNTER — Ambulatory Visit: Payer: Medicaid Other | Admitting: Student

## 2022-07-15 ENCOUNTER — Ambulatory Visit (INDEPENDENT_AMBULATORY_CARE_PROVIDER_SITE_OTHER): Payer: Medicaid Other | Admitting: Family Medicine

## 2022-07-15 VITALS — BP 103/66 | HR 68 | Ht 66.0 in | Wt 148.0 lb

## 2022-07-15 DIAGNOSIS — G5601 Carpal tunnel syndrome, right upper limb: Secondary | ICD-10-CM

## 2022-07-15 MED ORDER — CETIRIZINE HCL 10 MG PO TABS: ORAL_TABLET | ORAL | 2 refills | Status: DC

## 2022-07-15 MED ORDER — FLUTICASONE PROPIONATE 50 MCG/ACT NA SUSP: 1.0000 | Freq: Every day | NASAL | 1 refills | Status: DC

## 2022-07-15 MED ORDER — METHOCARBAMOL 500 MG PO TABS: 500.0000 mg | ORAL_TABLET | Freq: Two times a day (BID) | ORAL | 0 refills | Status: AC | PRN

## 2022-07-15 NOTE — Progress Notes (Unsigned)
    SUBJECTIVE:   CHIEF COMPLAINT / HPI:  No chief complaint on file.   Patient is here to discuss hand numbness and tingling. Patient reports that this has been an ongoing problem for about 3 months. Initially had numbness and tingling in the left hand which has resolved but now in the right hand. Reports only has numbness and tingling at night, occurring almost every day.  No symptoms during the day.  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  PERTINENT  PMH / PSH: asthma, GERD, schizophrenia  Patient Care Team: Alicia Amel, MD as PCP - General (Family Medicine)   OBJECTIVE:   BP 103/66   Pulse 68   Ht 5\' 6"  (1.676 m)   Wt 148 lb (67.1 kg)   SpO2 100%   BMI 23.89 kg/m   Physical Exam Constitutional:      General: She is not in acute distress. Cardiovascular:     Rate and Rhythm: Normal rate and regular rhythm.     Pulses:          Radial pulses are 2+ on the right side.  Pulmonary:     Effort: Pulmonary effort is normal. No respiratory distress.     Breath sounds: Normal breath sounds.  Musculoskeletal:     Cervical back: Neck supple.     Comments: Tinel sign negative.  Positive Phalen sign producing symptoms in the thumb and middle finger of bilateral hands.  Neurological:     Mental Status: She is alert.         07/15/2022    1:38 PM  Depression screen PHQ 2/9  Decreased Interest 0  Down, Depressed, Hopeless 0  PHQ - 2 Score 0  Altered sleeping 0  Tired, decreased energy 0  Change in appetite 0  Feeling bad or failure about yourself  0  Trouble concentrating 0  Moving slowly or fidgety/restless 0  Suicidal thoughts 0  PHQ-9 Score 0     {Show previous vital signs (optional):23777}  {Labs  Heme  Chem  Endocrine  Serology  Results Review (optional):23779}  ASSESSMENT/PLAN:   1. Carpal tunnel syndrome of right wrist Patient presenting with intermittent right hand numbness and tingling for about 3  months, initially in the left but this has resolved.  Based on the description of symptoms  Return in about 4 weeks (around 08/12/2022) for f/u hand numbness.   Littie Deeds, MD [facility]

## 2022-07-15 NOTE — Patient Instructions (Addendum)
It was nice seeing you today!  Try a cock-up wrist splint or a wrist splint for carpal tunnel syndrome. Wear this at night.  Stay well, Littie Deeds, MD [facility] [phone]  --  Make sure to check out at the front desk before you leave today.  Please arrive at least 15 minutes prior to your scheduled appointments.  If you had blood work today, I will send you a MyChart message or a letter if results are normal. Otherwise, I will give you a call.  If you had a referral placed, they will call you to set up an appointment. Please give [name] a call if you don't hear back in the next 2 weeks.  If you need additional refills before your next appointment, please call your pharmacy first.

## 2022-07-22 ENCOUNTER — Encounter: Payer: Medicaid Other | Admitting: Internal Medicine

## 2022-07-29 ENCOUNTER — Telehealth: Payer: Self-pay

## 2022-07-29 NOTE — Telephone Encounter (Signed)
Called back at 1245 and left message for pt to call back reschedule PV by end of day or we would cancel the colonoscopy

## 2022-07-29 NOTE — Telephone Encounter (Signed)
Called pt at 1230 for PV.  Went to [employer] consultant.  Left message saying I will call back in  min

## 2022-07-29 NOTE — Telephone Encounter (Signed)
No call back from no show.  PV and colonoscopy has been cancelled and no show letter sent

## 2022-08-04 ENCOUNTER — Ambulatory Visit (HOSPITAL_COMMUNITY)
Admission: EM | Admit: 2022-08-04 | Discharge: 2022-08-04 | Disposition: A | Discharge status: Home / Self Care | Payer: Medicaid Other | Attending: Sports Medicine | Admitting: Sports Medicine

## 2022-08-04 ENCOUNTER — Encounter (HOSPITAL_COMMUNITY): Payer: Self-pay | Admitting: *Deleted

## 2022-08-04 DIAGNOSIS — R21 Rash and other nonspecific skin eruption: Secondary | ICD-10-CM | POA: Insufficient documentation

## 2022-08-04 NOTE — ED Triage Notes (Signed)
Pt states she has a rash or breakout on her right buttocks X 2 days. She is using Lotrisone cream but she is out of valtrex.

## 2022-08-04 NOTE — ED Provider Notes (Signed)
[facility]    CSN: [id number] Arrival date & time: 08/04/22  0800      History   Chief Complaint Chief Complaint  Patient presents with   Rash    HPI Meghan Barrett is a 52 y.o. female.   She is here today with chief complaint of a rash on her buttocks.  She reports she has had this many times in the past.  She was told by one provider that it may be herpes and the provider thought it may have been a fungal infection.  She has been trying some clotrimazole cream with some relief of her discomfort but then in the past she has taken Valtrex with resolution of her symptoms as well.  She reports it is not painful or itchy that she notices it at the beginning and the end of the day.  Rash is located on the top of her right buttocks and low back.  She denies any spreading of the lesions.  She denies any painful blisters or lesions on her labia or buttocks.   Rash   Past Medical History:  Diagnosis Date   Anxiety    Asthma    Depression    Diabetes mellitus (HCC) dx'd 06/19/2013   Hernia    High cholesterol    Hx of diabetes mellitus 01/27/2020   Schizophrenia [facility])     Patient Active Problem List   Diagnosis Date Noted   Prediabetes 06/28/2022   Vaginal discomfort 05/13/2022   Hot flushes, perimenopausal 12/16/2021   Rash 11/06/2020   Vaginal discharge 05/22/2020   Hx of diabetes mellitus 01/27/2020   Gastroesophageal reflux disease 01/27/2020   Asthma in adult, moderate persistent, uncomplicated 12/13/2019   Hyperlipidemia 08/17/2015   Schizophrenia (HCC) 06/19/2013    Past Surgical History:  Procedure Laterality Date   APPENDECTOMY  08-06-15   CESAREAN SECTION  1991; 1999; 2002   FINGER FRACTURE SURGERY Left 2010   TUBAL LIGATION  2002    OB History   No obstetric history on file.      Home Medications    Prior to Admission medications   Medication Sig Start Date End Date Taking? Authorizing Provider  albuterol (VENTOLIN HFA) 108 (90 Base) MCG/ACT  inhaler Inhale 2 puffs into the lungs every 6 (six) hours as needed for wheezing or shortness of breath. 03/24/21  Yes Autry-Lott, Randa Evens, DO  ARIPiprazole (ABILIFY) 10 MG tablet Take by mouth. 03/06/20  Yes [provider]  cetirizine (ZYRTEC) 10 MG tablet Take one pill daily 07/15/22  Yes Littie Deeds, MD  clotrimazole-betamethasone (LOTRISONE) cream Apply to affected area 2 times daily prn 07/02/21  Yes Piontek, Erin, MD  diclofenac Sodium (VOLTAREN) 1 % GEL Apply to left knee and left foot for pain 03/24/21  Yes Autry-Lott, Randa Evens, DO  diclofenac Sodium (VOLTAREN) 1 % GEL Apply 2 g topically 4 (four) times daily. Rub into affected area of foot 2 to 4 times daily 12/16/21  Yes Dameron, Nolberto Hanlon, DO  famotidine (PEPCID) 10 MG tablet Take 1 tablet (10 mg total) by mouth 2 (two) times daily. 12/16/21  Yes Dameron, Nolberto Hanlon, DO  FLUoxetine (PROZAC) 20 MG capsule Take 20 mg by mouth daily. 12/02/19  Yes [provider]  fluticasone (FLONASE) 50 MCG/ACT nasal spray Place 1 spray into both nostrils daily. 07/15/22  Yes Littie Deeds, MD  methocarbamol (ROBAXIN) 500 MG tablet Take 1 tablet (500 mg total) by mouth 2 (two) times daily as needed for muscle spasms. 07/15/22  Yes Littie Deeds,  MD  montelukast (SINGULAIR) 10 MG tablet TAKE 1 TABLET(10 MG) BY MOUTH AT BEDTIME 12/17/21  Yes Alicia Amel, MD  mupirocin ointment (BACTROBAN) 2 % Apply 1 application topically daily. 04/30/21  Yes Raspet, Erin K, PA-C  polyethylene glycol powder (GLYCOLAX/MIRALAX) 17 GM/SCOOP powder Take 17 g by mouth daily. 01/11/21  Yes Autry-Lott, Randa Evens, DO  rosuvastatin (CRESTOR) 10 MG tablet Take 1 tablet (10 mg total) by mouth daily. 06/24/22 06/24/23 Yes Sowell, Apolinar Junes, MD  traZODone (DESYREL) 50 MG tablet Take by mouth. 03/06/20  Yes [provider]    Family History Family History  Problem Relation Age of Onset   Alcohol abuse Mother    Drug abuse Mother    Diabetes Mother    Alcohol abuse Father    Drug  abuse Father    Heart attack Father    Diabetes Father    Diabetes Sister    Early death Sister    High Cholesterol Sister    Cancer Other    Stroke Other     Social History Social History   Tobacco Use   Smoking status: Former    Years: 25    Types: Cigarettes   Smokeless tobacco: Never   Tobacco comments:    06/19/2013 "smoke ~ 5 cigarettes/month"  Vaping Use   Vaping Use: Never used  Substance Use Topics   Alcohol use: Not Currently    Alcohol/week: 1.0 standard drink of alcohol    Types: 1 Cans of beer per week   Drug use: Not Currently    Types: "Crack" cocaine    Comment: 06/19/2013 "got off crack cocaine almost 1 yr ago"     Allergies   Penicillins   Review of Systems Review of Systems  Skin:  Positive for rash.  As listed above in HPI   Physical Exam Triage Vital Signs ED Triage Vitals  Enc Vitals Group     BP 08/04/22 0825 (!) 88/61     Pulse Rate 08/04/22 0825 71     Resp 08/04/22 0825 18     Temp 08/04/22 0825 (!) 97.4 F (36.3 C)     Temp Source 08/04/22 0825 Oral     SpO2 08/04/22 0825 97 %     Weight --      Height --      Head Circumference --      Peak Flow --      Pain Score 08/04/22 0822 0     Pain Loc --      Pain Edu? --      Excl. in GC? --    No data found.  Updated Vital Signs BP (!) 88/61 (BP Location: Left Arm)   Pulse 71   Temp (!) 97.4 F (36.3 C) (Oral)   Resp 18   SpO2 97%    Physical Exam Vitals reviewed.  Constitutional:      General: She is not in acute distress.    Appearance: Normal appearance. She is not ill-appearing, toxic-appearing or diaphoretic.  Cardiovascular:     Rate and Rhythm: Normal rate.  Pulmonary:     Effort: Pulmonary effort is normal.  Skin:    General: Skin is warm.     Comments: At the superior medial right buttocks there are a few clustered vesicles with clear fluid, just inferior to that there is some scabbed lesions, no excoriation marks.  No surrounding erythema or streaking.   Neurological:     Mental Status: She is alert.  UC Treatments / Results  Labs (all labs ordered are listed, but only abnormal results are displayed) Labs Reviewed  HSV CULTURE AND TYPING  AEROBIC CULTURE W GRAM STAIN (SUPERFICIAL SPECIMEN)    EKG   Radiology No results found.  Procedures Procedures (including critical care time)  Medications Ordered in UC Medications - No data to display  Initial Impression / Assessment and Plan / UC Course  I have reviewed the triage vital signs and the nursing notes.  Pertinent labs & imaging results that were available during my care of the patient were reviewed by me and considered in my medical decision making (see chart for details).     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  Urgent care staff will call her and we will treat as appropriate, Valtrex if HSV positive.  I encouraged her to keep the area clean and dry.  She may use Vaseline for barrier protection.  Follow-up with her primary care provider. Final Clinical Impressions(s) / UC Diagnoses   Final diagnoses:  Rash     Discharge Instructions      We have cultured the rash on your bottom.  He is not to the lab and have urgent care staff call you with your results.  Should something come back abnormal we will send the appropriate medications and let you know.  At this time I would keep the area clean and dry and avoid scratching the area.  He can use some Vaseline to keep the area clean.    ED Prescriptions   None    PDMP not reviewed this encounter.   Claudie Leach, DO 08/04/22 [phone]

## 2022-08-04 NOTE — Discharge Instructions (Addendum)
We have cultured the rash on your bottom.  He is not to the lab and have urgent care staff call you with your results.  Should something come back abnormal we will send the appropriate medications and let you know.  At this time I would keep the area clean and dry and avoid scratching the area.  He can use some Vaseline to keep the area clean.

## 2022-08-05 ENCOUNTER — Encounter: Payer: Self-pay | Admitting: Student

## 2022-08-05 ENCOUNTER — Ambulatory Visit (INDEPENDENT_AMBULATORY_CARE_PROVIDER_SITE_OTHER): Payer: Medicaid Other | Admitting: Student

## 2022-08-05 VITALS — BP 110/72 | HR 74 | Ht 66.0 in | Wt 144.0 lb

## 2022-08-05 DIAGNOSIS — K219 Gastro-esophageal reflux disease without esophagitis: Secondary | ICD-10-CM

## 2022-08-05 DIAGNOSIS — M1990 Unspecified osteoarthritis, unspecified site: Secondary | ICD-10-CM | POA: Insufficient documentation

## 2022-08-05 DIAGNOSIS — F209 Schizophrenia, unspecified: Secondary | ICD-10-CM | POA: Diagnosis not present

## 2022-08-05 DIAGNOSIS — Z8639 Personal history of other endocrine, nutritional and metabolic disease: Secondary | ICD-10-CM

## 2022-08-05 DIAGNOSIS — J4540 Moderate persistent asthma, uncomplicated: Secondary | ICD-10-CM

## 2022-08-05 DIAGNOSIS — Z0000 Encounter for general adult medical examination without abnormal findings: Secondary | ICD-10-CM | POA: Diagnosis not present

## 2022-08-05 LAB — AEROBIC CULTURE W GRAM STAIN (SUPERFICIAL SPECIMEN)
Culture: NO GROWTH
Special Requests: NORMAL

## 2022-08-05 LAB — POCT GLYCOSYLATED HEMOGLOBIN (HGB A1C): HbA1c, POC (prediabetic range): 5.8 % (ref 5.7–6.4)

## 2022-08-05 MED ORDER — DICLOFENAC SODIUM 1 % EX GEL
2.0000 g | Freq: Four times a day (QID) | CUTANEOUS | 2 refills | Status: AC
Start: 2022-08-05 — End: ?

## 2022-08-05 MED ORDER — MONTELUKAST SODIUM 10 MG PO TABS
ORAL_TABLET | ORAL | 2 refills | Status: AC
Start: 2022-08-05 — End: ?

## 2022-08-05 MED ORDER — FAMOTIDINE 10 MG PO TABS: 10.0000 mg | ORAL_TABLET | Freq: Two times a day (BID) | ORAL | 1 refills | Status: DC

## 2022-08-05 NOTE — Progress Notes (Signed)
    SUBJECTIVE:   CHIEF COMPLAINT / HPI:   GERD Symptoms GERD has been exacerbated recently with increased intake of hibachi and tomato sauce which are both known triggers for her.  She is also out of her Pepcid and requesting a refill.  The primary bothersome symptoms for her are heartburn and occasional nausea.  Arthritis Diffuse joint pains exacerbated by her job at [employer] which has her on her feet "all day".  Has used Voltaren gel successfully in the past but is out.  Requesting refill.  Paranoia Tells me she thinks we are "lying to her" about her cholesterol.  Noted to have elevated cholesterol at her visit in February and was recently started on rosuvastatin.  Tells me she has been taking this every day.  Would like her cholesterol rechecked today.  Health maintenance Needs a colonoscopy.  This was ordered at her last visit and per review of the referral, it looks like GI called her to set up the appointment but she has not called them back.  OBJECTIVE:   BP 110/72   Pulse 74   Ht 5\' 6"  (1.676 m)   Wt 144 lb (65.3 kg)   SpO2 98%   BMI 23.24 kg/m   Physical Exam Vitals reviewed.  Constitutional:      General: She is not in acute distress. Pulmonary:     Effort: Pulmonary effort is normal. No respiratory distress.  Abdominal:     Tenderness: There is abdominal tenderness (mild, epigastric).  Musculoskeletal:        General: No swelling or deformity.     Comments: Joint line edema to the bilateral knees  Skin:    General: Skin is warm and dry.  Psychiatric:        Attention and Perception: She does not perceive auditory or visual hallucinations.        Behavior: Behavior is slowed. Behavior is not agitated or aggressive.     Comments: Paranoia is present      ASSESSMENT/PLAN:   Gastroesophageal reflux disease Heartburn, nausea since running out of her Pepcid.  Known trigger foods including hibachi and tomato sauce. -Refilling Pepcid today -Advised to avoid known  triggers  Osteoarthritis Diffuse, worse in bilateral knees.  Responds well to Voltaren gel.  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 -Voltaren gel ordered  Schizophrenia (HCC) A bit paranoid on exam today.  Denies any hallucinations.  Does not appear frankly psychotic at this time.  Tells me she has follow-up with her psychiatrist in the next few weeks. -Encouraged to make sure she keeps appointment with psychiatrist -Actually has a meeting with her counselor today  Healthcare maintenance Patient to call endoscopy center to schedule colonoscopy  Hx of diabetes mellitus A1c well controlled at 5.8% today.  Urine albumin creatinine ratio ordered     J Dorothyann Gibbs, MD [facility]

## 2022-08-05 NOTE — Assessment & Plan Note (Signed)
Patient to call endoscopy center to schedule colonoscopy

## 2022-08-05 NOTE — Assessment & Plan Note (Signed)
A bit paranoid on exam today.  Denies any hallucinations.  Does not appear frankly psychotic at this time.  Tells me she has follow-up with her psychiatrist in the next few weeks. -Encouraged to make sure she keeps appointment with psychiatrist -Actually has a meeting with her counselor today

## 2022-08-05 NOTE — Assessment & Plan Note (Signed)
Diffuse, worse in bilateral knees.  Responds well to Voltaren gel.  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 -Voltaren gel ordered

## 2022-08-05 NOTE — Assessment & Plan Note (Signed)
Heartburn, nausea since running out of her Pepcid.  Known trigger foods including hibachi and tomato sauce. -Refilling Pepcid today -Advised to avoid known triggers

## 2022-08-05 NOTE — Patient Instructions (Addendum)
Ms. Shatira, [place] to see you Please call the gastroenterologist at [phone] to schedule your colonoscopy.  I am refilling your Pepcid, you should also take care to avoid the "trigger" foods such as oily hibachi and tomato sauce.  Be sure to keep your next psych appt next month.   Eliezer Mccoy, MD

## 2022-08-05 NOTE — Assessment & Plan Note (Signed)
A1c well controlled at 5.8% today.  Urine albumin creatinine ratio ordered

## 2022-08-06 LAB — MICROALBUMIN / CREATININE URINE RATIO
Creatinine, Urine: 119.6 mg/dL
Microalb/Creat Ratio: 3 mg/g creat (ref 0–29)
Microalbumin, Urine: 4.1 ug/mL

## 2022-08-07 LAB — AEROBIC CULTURE W GRAM STAIN (SUPERFICIAL SPECIMEN): Gram Stain: NONE SEEN

## 2022-08-07 LAB — HSV CULTURE AND TYPING

## 2022-08-08 ENCOUNTER — Encounter: Payer: Self-pay | Admitting: Student

## 2022-08-09 ENCOUNTER — Telehealth: Payer: Self-pay

## 2022-08-09 ENCOUNTER — Telehealth (HOSPITAL_COMMUNITY): Payer: Self-pay | Admitting: Emergency Medicine

## 2022-08-09 MED ORDER — VALACYCLOVIR HCL 1 G PO TABS
1000.0000 mg | ORAL_TABLET | Freq: Three times a day (TID) | ORAL | 0 refills | Status: DC
Start: 2022-08-09 — End: 2022-11-30

## 2022-08-09 NOTE — Telephone Encounter (Signed)
Patient call nurse line in regards to results.   She reports she was seen at [facility] on Thursday and was told by her PCP he would call her with those results.   She reports the area on her buttocks is not improving and spreading.   I did advise her all lab work here on 5/17 was WNL.   Will forward to PCP.

## 2022-08-12 ENCOUNTER — Ambulatory Visit: Payer: Medicaid Other

## 2022-08-19 ENCOUNTER — Ambulatory Visit
Admission: RE | Admit: 2022-08-19 | Discharge: 2022-08-19 | Disposition: A | Discharge status: Home / Self Care | Payer: Medicaid Other | Source: Ambulatory Visit | Attending: Family Medicine | Admitting: Family Medicine

## 2022-08-19 DIAGNOSIS — Z1231 Encounter for screening mammogram for malignant neoplasm of breast: Secondary | ICD-10-CM

## 2022-08-26 ENCOUNTER — Encounter: Payer: Medicaid Other | Admitting: Internal Medicine

## 2022-11-17 ENCOUNTER — Encounter (HOSPITAL_COMMUNITY): Payer: Self-pay

## 2022-11-17 ENCOUNTER — Ambulatory Visit (HOSPITAL_COMMUNITY)
Admission: EM | Admit: 2022-11-17 | Discharge: 2022-11-17 | Disposition: A | Discharge status: Home / Self Care | Payer: MEDICAID | Attending: Internal Medicine | Admitting: Internal Medicine

## 2022-11-17 DIAGNOSIS — R079 Chest pain, unspecified: Secondary | ICD-10-CM | POA: Diagnosis not present

## 2022-11-17 NOTE — ED Notes (Signed)
Patient is being discharged from the Urgent Care and sent to the Emergency Department via POV . Per Cathlean Marseilles NP, patient is in need of higher level of care due to Left side chest to arm pain. Patient is aware and verbalizes understanding of plan of care.  Vitals:   11/17/22 1544  BP: 100/67  Pulse: 70  Resp: 18  Temp: 97.7 F (36.5 C)  SpO2: 97%

## 2022-11-17 NOTE — Discharge Instructions (Signed)
Please proceed directly to the emergency room for further evaluation of the chest pain you are having.

## 2022-11-17 NOTE — ED Provider Notes (Signed)
[facility]    CSN: [id number] Arrival date & time: 11/17/22  1445      History   Chief Complaint Chief Complaint  Patient presents with   Arm Pain   Chest Pain   Nail Problem    HPI Meghan Barrett is a 52 y.o. female.   Patient presents today with 2-day history of left-sided chest pain radiated on the left arm.  Reports initially, the pain was intermittent, now the pain is constant.  She rates the pain is a 9.5 out of 10 and describes the pain as sharp.  She denies pain worsening with exertion or recent trauma to the chest or arm.  Reports she has anxiety at baseline and feels palpitations intermittently at baseline.  No shortness of breath, cough, nausea or vomiting, diaphoresis, or heartburn.  Reports history of prediabetes, high cholesterol, has been taking cholesterol medication daily as prescribed by PCP.    Past Medical History:  Diagnosis Date   Anxiety    Asthma    Depression    Diabetes mellitus (HCC) dx'd 06/19/2013   Hernia    High cholesterol    Hx of diabetes mellitus 01/27/2020   Schizophrenia [facility])     Patient Active Problem List   Diagnosis Date Noted   Osteoarthritis 08/05/2022   Prediabetes 06/28/2022   Vaginal discomfort 05/13/2022   Hot flushes, perimenopausal 12/16/2021   Healthcare maintenance 09/11/2021   Rash 11/06/2020   Vaginal discharge 05/22/2020   Hx of diabetes mellitus 01/27/2020   Gastroesophageal reflux disease 01/27/2020   Asthma in adult, moderate persistent, uncomplicated 12/13/2019   Hyperlipidemia 08/17/2015   Schizophrenia (HCC) 06/19/2013    Past Surgical History:  Procedure Laterality Date   APPENDECTOMY  08-06-15   CESAREAN SECTION  1991; 1999; 2002   FINGER FRACTURE SURGERY Left 2010   TUBAL LIGATION  2002    OB History   No obstetric history on file.      Home Medications    Prior to Admission medications   Medication Sig Start Date End Date Taking? Authorizing Provider  albuterol (VENTOLIN HFA)  108 (90 Base) MCG/ACT inhaler Inhale 2 puffs into the lungs every 6 (six) hours as needed for wheezing or shortness of breath. 03/24/21  Yes Autry-Lott, Randa Evens, DO  ARIPiprazole (ABILIFY) 10 MG tablet Take by mouth. 03/06/20  Yes [provider]  cetirizine (ZYRTEC) 10 MG tablet Take one pill daily 07/15/22  Yes Littie Deeds, MD  diclofenac Sodium (VOLTAREN) 1 % GEL Apply 2 g topically 4 (four) times daily. Rub into affected area of foot 2 to 4 times daily 08/05/22  Yes Alicia Amel, MD  famotidine (PEPCID) 10 MG tablet Take 1 tablet (10 mg total) by mouth 2 (two) times daily. 08/05/22  Yes Alicia Amel, MD  FLUoxetine (PROZAC) 20 MG capsule Take 20 mg by mouth daily. 12/02/19  Yes [provider]  fluticasone (FLONASE) 50 MCG/ACT nasal spray Place 1 spray into both nostrils daily. 07/15/22  Yes Littie Deeds, MD  methocarbamol (ROBAXIN) 500 MG tablet Take 1 tablet (500 mg total) by mouth 2 (two) times daily as needed for muscle spasms. 07/15/22  Yes Littie Deeds, MD  montelukast (SINGULAIR) 10 MG tablet TAKE 1 TABLET(10 MG) BY MOUTH AT BEDTIME 08/05/22  Yes Alicia Amel, MD  rosuvastatin (CRESTOR) 10 MG tablet Take 1 tablet (10 mg total) by mouth daily. 06/24/22 06/24/23 Yes Sowell, Apolinar Junes, MD  traZODone (DESYREL) 50 MG tablet Take by mouth. 03/06/20  Yes [provider]  valACYclovir (VALTREX) 1000 MG tablet Take 1 tablet (1,000 mg total) by mouth 3 (three) times daily. 08/09/22  Yes Lamptey, Britta Mccreedy, MD  polyethylene glycol powder (GLYCOLAX/MIRALAX) 17 GM/SCOOP powder Take 17 g by mouth daily. 01/11/21   Autry-Lott, Randa Evens, DO    Family History Family History  Problem Relation Age of Onset   Alcohol abuse Mother    Drug abuse Mother    Diabetes Mother    Alcohol abuse Father    Drug abuse Father    Heart attack Father    Diabetes Father    Diabetes Sister    Early death Sister    High Cholesterol Sister    Breast cancer Maternal Grandmother    Cancer Other     Stroke Other     Social History Social History   Tobacco Use   Smoking status: Former    Types: Cigarettes   Smokeless tobacco: Never   Tobacco comments:    06/19/2013 "smoke ~ 5 cigarettes/month"  Vaping Use   Vaping status: Never Used  Substance Use Topics   Alcohol use: Not Currently    Alcohol/week: 1.0 standard drink of alcohol    Types: 1 Cans of beer per week   Drug use: Not Currently    Types: "Crack" cocaine    Comment: 06/19/2013 "got off crack cocaine almost 1 yr ago"     Allergies   Penicillins   Review of Systems Review of Systems Per HPI  Physical Exam Triage Vital Signs ED Triage Vitals  Encounter Vitals Group     BP 11/17/22 1544 100/67     Systolic BP Percentile --      Diastolic BP Percentile --      Pulse Rate 11/17/22 1544 70     Resp 11/17/22 1544 18     Temp 11/17/22 1544 97.7 F (36.5 C)     Temp Source 11/17/22 1544 Oral     SpO2 11/17/22 1544 97 %     Weight 11/17/22 1542 143 lb 15.4 oz (65.3 kg)     Height 11/17/22 1542 5\' 6"  (1.676 m)     Head Circumference --      Peak Flow --      Pain Score 11/17/22 1540 9     Pain Loc --      Pain Education --      Exclude from Growth Chart --    No data found.  Updated Vital Signs BP 100/67 (BP Location: Right Arm)   Pulse 70   Temp 97.7 F (36.5 C) (Oral)   Resp 18   Ht 5\' 6"  (1.676 m)   Wt 143 lb 15.4 oz (65.3 kg)   SpO2 97%   BMI 23.24 kg/m   Visual Acuity Right Eye Distance:   Left Eye Distance:   Bilateral Distance:    Right Eye Near:   Left Eye Near:    Bilateral Near:     Physical Exam Vitals and nursing note reviewed.  Constitutional:      General: She is not in acute distress.    Appearance: She is well-developed. She is not toxic-appearing.  HENT:     Head: Normocephalic and atraumatic.  Cardiovascular:     Rate and Rhythm: Normal rate.  Pulmonary:     Effort: Pulmonary effort is normal.  Musculoskeletal:     Cervical back: Normal range of motion.      Right lower leg: No tenderness. No edema.     Left lower  leg: No tenderness. No edema.  Lymphadenopathy:     Cervical: No cervical adenopathy.  Skin:    General: Skin is warm and dry.     Capillary Refill: Capillary refill takes less than 2 seconds.     Coloration: Skin is not cyanotic or pale.     Findings: No erythema.  Neurological:     Mental Status: She is alert and oriented to person, place, and time.  Psychiatric:        Behavior: Behavior is cooperative.      UC Treatments / Results  Labs (all labs ordered are listed, but only abnormal results are displayed) Labs Reviewed - No data to display  EKG   Radiology No results found.  Procedures Procedures (including critical care time)  Medications Ordered in UC Medications - No data to display  Initial Impression / Assessment and Plan / UC Course  I have reviewed the triage vital signs and the nursing notes.  Pertinent labs & imaging results that were available during my care of the patient were reviewed by me and considered in my medical decision making (see chart for details).   Patient is well-appearing, normotensive, afebrile, not tachycardic, not tachypneic, oxygenating well on room air.    1. Chest pain, unspecified type EKG shows normal sinus rhythm with sinus arrhythmia; no significant ST segment or T wave abnormalities Pain is not reproducible on examination today; vital signs are stable, therefore no need to transport to ED emergently via EMS I recommended further evaluation and management in the emergency room given significant medical history of diabetes and high cholesterol Patient is reluctant to transport to ER because she does not want to wait a long time I reiterated with her that my recommendation is that she be evaluated in the ER for evaluation of chest pain and that if she does not, risks could be life altering and could include death Patient states "I am willing to take the risk" and is  requesting a note for work Reports she would rather go first thing in the morning when she can wait for a long period and have the bus take her home  The patient was given the opportunity to ask questions.  All questions answered to their satisfaction.  The patient is in agreement to this plan.    Final Clinical Impressions(s) / UC Diagnoses   Final diagnoses:  Chest pain, unspecified type     Discharge Instructions      Please proceed directly to the emergency room for further evaluation of the chest pain you are having.     ED Prescriptions   None    PDMP not reviewed this encounter.   Valentino Nose, NP 11/17/22 [phone]

## 2022-11-17 NOTE — ED Triage Notes (Addendum)
Bilateral Arm Pain; Chest Pain for 2 Days. Patient states she was at work and the onset was sudden. States the last time this happened she went to the hospital and diagnosed with high cholesterol. Patient still on rosuvastatin with no missed doses.   Pain in both lower arms going from the wrist to the elbow, sharp pains.  Patient having symptoms of fungus between the toes of the left foot. States she is having excessive thirst and hunger as well. History of high blood sugars.

## 2022-11-18 ENCOUNTER — Emergency Department (HOSPITAL_COMMUNITY): Payer: MEDICAID

## 2022-11-18 ENCOUNTER — Emergency Department (HOSPITAL_COMMUNITY)
Admission: EM | Admit: 2022-11-18 | Discharge: 2022-11-18 | Disposition: A | Discharge status: Home / Self Care | Payer: MEDICAID

## 2022-11-18 ENCOUNTER — Emergency Department (HOSPITAL_BASED_OUTPATIENT_CLINIC_OR_DEPARTMENT_OTHER): Payer: MEDICAID

## 2022-11-18 ENCOUNTER — Other Ambulatory Visit: Payer: Self-pay

## 2022-11-18 DIAGNOSIS — M79602 Pain in left arm: Secondary | ICD-10-CM

## 2022-11-18 DIAGNOSIS — R079 Chest pain, unspecified: Secondary | ICD-10-CM | POA: Diagnosis present

## 2022-11-18 DIAGNOSIS — E119 Type 2 diabetes mellitus without complications: Secondary | ICD-10-CM | POA: Diagnosis not present

## 2022-11-18 LAB — BASIC METABOLIC PANEL
Anion gap: 14 (ref 5–15)
BUN: 13 mg/dL (ref 6–20)
CO2: 26 mmol/L (ref 22–32)
Calcium: 9.3 mg/dL (ref 8.9–10.3)
Chloride: 98 mmol/L (ref 98–111)
Creatinine, Ser: 1.14 mg/dL — ABNORMAL HIGH (ref 0.44–1.00)
GFR, Estimated: 59 mL/min — ABNORMAL LOW (ref >60)
Glucose, Bld: 107 mg/dL — ABNORMAL HIGH (ref 70–99)
Potassium: 4.3 mmol/L (ref 3.5–5.1)
Sodium: 138 mmol/L (ref 135–145)

## 2022-11-18 LAB — CBC
HCT: 36.9 % (ref 36.0–46.0)
Hemoglobin: 12.1 g/dL (ref 12.0–15.0)
MCH: 30.2 pg (ref 26.0–34.0)
MCHC: 32.8 g/dL (ref 30.0–36.0)
MCV: 92 fL (ref 80.0–100.0)
Platelets: 266 10*3/uL (ref 150–400)
RBC: 4.01 MIL/uL (ref 3.87–5.11)
RDW: 12.3 % (ref 11.5–15.5)
WBC: 4.5 10*3/uL (ref 4.0–10.5)
nRBC: 0 % (ref 0.0–0.2)

## 2022-11-18 LAB — D-DIMER, QUANTITATIVE: D-Dimer, Quant: <0.27 ug{FEU}/mL (ref 0.00–0.50)

## 2022-11-18 LAB — TROPONIN I (HIGH SENSITIVITY)
Troponin I (High Sensitivity): 2 ng/L
Troponin I (High Sensitivity): 3 ng/L

## 2022-11-18 LAB — HCG, SERUM, QUALITATIVE: Preg, Serum: NEGATIVE

## 2022-11-18 MED ORDER — NAPROXEN 500 MG PO TABS: 500.0000 mg | ORAL_TABLET | Freq: Two times a day (BID) | ORAL | 0 refills | Status: DC

## 2022-11-18 NOTE — ED Triage Notes (Signed)
Pt arrives for eval of two days of intermittent central chest pain with radiation to both arms. She says the pain "is going to my main artery." Pain relieved by rest. Went to UC for same yesterday and was advised to come to the ED but did not have a ride home so she came today.

## 2022-11-18 NOTE — ED Provider Notes (Signed)
[place] EMERGENCY DEPARTMENT AT [facility] Provider Note   CSN: [id number] Arrival date & time: 11/18/22  1014     History  Chief Complaint  Patient presents with   Chest Pain    Meghan Barrett is a 52 y.o. female.  52 year old female with past medical history of diabetes and hyperlipidemia presenting to the emergency department today with chest pain and pain in her left arm.  The patient states this been going now for the past few days.  She states the pain is sharp in character.  She states that the chest discomfort is sharp in character.  She denies any associated shortness of breath.  Denies any hemoptysis.  The patient denies any fevers, chills, or cough.  She denies a history of DVT or pulmonary embolism, recent surgeries, recent travel.  She went to urgent care today and was subsequently sent to the emergency department for further evaluation.  The history is provided by the patient.  Chest Pain      Home Medications Prior to Admission medications   Medication Sig Start Date End Date Taking? Authorizing Provider  naproxen (NAPROSYN) 500 MG tablet Take 1 tablet (500 mg total) by mouth 2 (two) times daily. 11/18/22  Yes Durwin Glaze, MD  albuterol (VENTOLIN HFA) 108 (90 Base) MCG/ACT inhaler Inhale 2 puffs into the lungs every 6 (six) hours as needed for wheezing or shortness of breath. 03/24/21   Autry-Lott, Randa Evens, DO  ARIPiprazole (ABILIFY) 10 MG tablet Take by mouth. 03/06/20   [provider]  cetirizine (ZYRTEC) 10 MG tablet Take one pill daily 07/15/22   Littie Deeds, MD  diclofenac Sodium (VOLTAREN) 1 % GEL Apply 2 g topically 4 (four) times daily. Rub into affected area of foot 2 to 4 times daily 08/05/22   Alicia Amel, MD  famotidine (PEPCID) 10 MG tablet Take 1 tablet (10 mg total) by mouth 2 (two) times daily. 08/05/22   Alicia Amel, MD  FLUoxetine (PROZAC) 20 MG capsule Take 20 mg by mouth daily. 12/02/19   [provider]   fluticasone (FLONASE) 50 MCG/ACT nasal spray Place 1 spray into both nostrils daily. 07/15/22   Littie Deeds, MD  methocarbamol (ROBAXIN) 500 MG tablet Take 1 tablet (500 mg total) by mouth 2 (two) times daily as needed for muscle spasms. 07/15/22   Littie Deeds, MD  montelukast (SINGULAIR) 10 MG tablet TAKE 1 TABLET(10 MG) BY MOUTH AT BEDTIME 08/05/22   Alicia Amel, MD  polyethylene glycol powder (GLYCOLAX/MIRALAX) 17 GM/SCOOP powder Take 17 g by mouth daily. 01/11/21   Autry-Lott, Randa Evens, DO  rosuvastatin (CRESTOR) 10 MG tablet Take 1 tablet (10 mg total) by mouth daily. 06/24/22 06/24/23  Bess Kinds, MD  traZODone (DESYREL) 50 MG tablet Take by mouth. 03/06/20   [provider]  valACYclovir (VALTREX) 1000 MG tablet Take 1 tablet (1,000 mg total) by mouth 3 (three) times daily. 08/09/22   LampteyBritta Mccreedy, MD      Allergies    Penicillins    Review of Systems   Review of Systems  Cardiovascular:  Positive for chest pain.  Gen: No fevers Eyes: No vision changes HEENT: no congestion, sore throat Neck: no neck stiffness Resp: no cough, shortness of breath Card: see HPI Abd: no nausea or vomiting, no abdominal pain Extremities: no leg swelling Neuro: no weakness, numbness, tingling Skin: no rashes   Physical Exam Updated Vital Signs BP 121/80   Pulse 61   Temp 98.2  F (36.8 C)   Resp 13   SpO2 100%  Physical Exam Vitals and nursing note reviewed.   Gen: NAD Eyes: PERRL, EOMI HEENT: no oropharyngeal swelling Neck: trachea midline Resp: clear to auscultation bilaterally Card: RRR, no murmurs, rubs, or gallops Abd: nontender, nondistended Extremities: no calf tenderness, no edema, the patient is tender over the antecubital fossa on the left as well as over the basilic vein on the left with no obvious palpable cords noted Vascular: 2+ radial pulses bilaterally, 2+ DP pulses bilaterally Skin: no rashes Psyc: acting appropriately   ED Results / Procedures /  Treatments   Labs (all labs ordered are listed, but only abnormal results are displayed) Labs Reviewed  BASIC METABOLIC PANEL - Abnormal; Notable for the following components:      Result Value   Glucose, Bld 107 (*)    Creatinine, Ser 1.14 (*)    GFR, Estimated 59 (*)    All other components within normal limits  CBC  HCG, SERUM, QUALITATIVE  D-DIMER, QUANTITATIVE  TROPONIN I (HIGH SENSITIVITY)  TROPONIN I (HIGH SENSITIVITY)    EKG EKG Interpretation Date/Time:  Friday November 18 2022 10:21:37 EDT Ventricular Rate:  58 PR Interval:  140 QRS Duration:  88 QT Interval:  412 QTC Calculation: 404 R Axis:   67  Text Interpretation: Sinus bradycardia Otherwise normal ECG When compared with ECG of 17-Nov-2022 15:54, PREVIOUS ECG IS PRESENT Confirmed by Beckey Downing [phone]) on 11/18/2022 1:15:43 PM  Radiology UE Venous Duplex ([facility] and [facility] ONLY)  Result Date: 11/18/2022 UPPER VENOUS STUDY  Patient Name:  Meghan Barrett  Date of Exam:   11/18/2022 Medical Rec #: [id number]    Accession #:    [id number] Date of Birth: 12-05-1970   Patient Gender: F Patient Age:   19 years Exam Location:  [facility] Procedure:      VAS [name] UPPER EXTREMITY VENOUS DUPLEX Referring Phys: Greig Castilla Cheng Dec --------------------------------------------------------------------------------  Indications: Left arm pain, most notable at [facility] per patient, recent needle stick Comparison Study: No prior studies. Performing Technologist: Jean Rosenthal RDMS, RVT  Examination Guidelines: A complete evaluation includes B-mode imaging, spectral Doppler, color Doppler, and power Doppler as needed of all accessible portions of each vessel. Bilateral testing is considered an integral part of a complete examination. Limited examinations for reoccurring indications may be performed as noted.  Right Findings: +----------+------------+---------+-----------+----------+-------+ RIGHT     CompressiblePhasicitySpontaneousPropertiesSummary  +----------+------------+---------+-----------+----------+-------+ Subclavian               Yes       Yes                      +----------+------------+---------+-----------+----------+-------+  Left Findings: +----------+------------+---------+-----------+----------+---------------------+ LEFT      CompressiblePhasicitySpontaneousProperties       Summary        +----------+------------+---------+-----------+----------+---------------------+ IJV           Full       Yes       Yes                                    +----------+------------+---------+-----------+----------+---------------------+ Subclavian               Yes       Yes                                    +----------+------------+---------+-----------+----------+---------------------+  Axillary      Full       Yes       Yes                                    +----------+------------+---------+-----------+----------+---------------------+ Brachial      Full                                                        +----------+------------+---------+-----------+----------+---------------------+ Radial        Full                                                        +----------+------------+---------+-----------+----------+---------------------+ Ulnar         Full                                                        +----------+------------+---------+-----------+----------+---------------------+ Cephalic    Partial      Yes       Yes              Very short segment of                                                     occlusion at [facility] only,                                                     consistent with area                                                           of previous                                                             stick/most pain    +----------+------------+---------+-----------+----------+---------------------+ Basilic       Full                                                         +----------+------------+---------+-----------+----------+---------------------+  Summary:  Right: No evidence of thrombosis in the subclavian.  Left: No evidence of deep vein thrombosis in the upper extremity. Findings consistent with acute superficial vein thrombosis involving a short segment of the left cephalic vein at the antecubital fossa/site of previous access.  *See table(s) above for measurements and observations.    Preliminary    DG Chest 2 View  Result Date: 11/18/2022 CLINICAL DATA:  Chest pain EXAM: CHEST - 2 VIEW COMPARISON:  X-ray 12/10/2020 FINDINGS: No consolidation, pneumothorax or effusion. Hyperinflation. Normal cardiopericardial silhouette. Curvature and degenerative changes along the spine. IMPRESSION: Hyperinflation.  No acute cardiopulmonary disease Electronically Signed   By: Karen Kays M.D.   On: 11/18/2022 10:48    Procedures Procedures    Medications Ordered in ED Medications - No data to display  ED Course/ Medical Decision Making/ A&P                                 Medical Decision Making 52 year old female with past medical history of diabetes and hyperlipidemia presenting to the emergency department today with chest pain and left arm pain.  I will further evaluate the patient here with basic labs well as an EKG, chest x-ray, and troponin for further evaluation for ACS, pulmonary edema, pulmonary infiltrates, or pneumothorax.  Also obtain a D-dimer to evaluate for pulmonary embolism.  I will obtain an ultrasound of her left upper extremity to evaluate for DVT.  I will reevaluate for ultimate disposition.  Based on description of her symptoms suspicion for aortic dissection is low at this time.  This may be due to musculoskeletal pain.  Referred by me shows a sinus bradycardia with no significant ST-T changes.  The patient did have 1 low blood pressure reading here.  This was erroneous and when I reevaluated the  patient has had multiple blood pressures with systolic pressures greater than 110.  Her ultrasound shows some thrombophlebitis but no other concerning findings.  She will be discharged on anti-inflammatories.  She does have risk factors for cardiac chest pain to so she will be referred to cardiology as an outpatient.  Amount and/or Complexity of Data Reviewed Labs: ordered. Radiology: ordered.           Final Clinical Impression(s) / ED Diagnoses Final diagnoses:  Chest pain, unspecified type  Dispo: discharge  Rx / DC Orders ED Discharge Orders          Ordered    naproxen (NAPROSYN) 500 MG tablet  2 times daily        11/18/22 1439    Ambulatory referral to Cardiology       Comments: If you have not heard from the Cardiology office within the next 72 hours please call [phone].   11/18/22 1440              Durwin Glaze, MD 11/18/22 1440

## 2022-11-18 NOTE — ED Notes (Signed)
Pt was not in room when second troponin and primary assessment was attempted to obtain.

## 2022-11-18 NOTE — ED Notes (Signed)
BP was not accurate. Pt was moving and flexing her arm while BP was attempting to be taken.

## 2022-11-18 NOTE — Discharge Instructions (Signed)
Your workup today was reassuring.  Please take the anti-inflammatory as prescribed.  I have placed a consult to our cardiology team.  You should receive a call over the next few days.  Follow-up and return to the ER for worsening symptoms.

## 2022-11-18 NOTE — Progress Notes (Signed)
Upper extremity venous left study completed.  Preliminary results relayed to Rhae Hammock, MD.   See CV Proc for preliminary results report.   Jean Rosenthal, RDMS, RVT

## 2022-11-22 ENCOUNTER — Encounter (HOSPITAL_COMMUNITY): Payer: Self-pay

## 2022-11-22 ENCOUNTER — Emergency Department (HOSPITAL_COMMUNITY): Payer: MEDICAID

## 2022-11-22 ENCOUNTER — Emergency Department (HOSPITAL_COMMUNITY)
Admission: EM | Admit: 2022-11-22 | Discharge: 2022-11-23 | Disposition: A | Discharge status: Home / Self Care | Payer: MEDICAID | Attending: Emergency Medicine | Admitting: Emergency Medicine

## 2022-11-22 ENCOUNTER — Other Ambulatory Visit: Payer: Self-pay

## 2022-11-22 DIAGNOSIS — Z79899 Other long term (current) drug therapy: Secondary | ICD-10-CM | POA: Insufficient documentation

## 2022-11-22 DIAGNOSIS — M25562 Pain in left knee: Secondary | ICD-10-CM | POA: Insufficient documentation

## 2022-11-22 DIAGNOSIS — I10 Essential (primary) hypertension: Secondary | ICD-10-CM | POA: Insufficient documentation

## 2022-11-22 DIAGNOSIS — Y92410 Unspecified street and highway as the place of occurrence of the external cause: Secondary | ICD-10-CM | POA: Diagnosis not present

## 2022-11-22 DIAGNOSIS — M545 Low back pain, unspecified: Secondary | ICD-10-CM | POA: Insufficient documentation

## 2022-11-22 DIAGNOSIS — E119 Type 2 diabetes mellitus without complications: Secondary | ICD-10-CM | POA: Diagnosis not present

## 2022-11-22 LAB — POC URINE PREG, ED: Preg Test, Ur: NEGATIVE
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
## 2022-11-23 MED ORDER — CYCLOBENZAPRINE HCL 10 MG PO TABS: 10.0000 mg | ORAL_TABLET | Freq: Two times a day (BID) | ORAL | 0 refills | Status: DC | PRN
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
## 2022-11-23 NOTE — Discharge Instructions (Signed)
Exam and imaging are reassuring, like you have some muscular pain, given you muscle relaxer take as prescribed, may use over-the-counter pain medication as needed.  Muscle relaxer can make you drowsy do not consume alcohol or operate heavy machinery while taking this medication.  Recommend follow with your PCP for further assessment.  Come back to the emergency department if you develop chest pain, shortness of breath, severe abdominal pain, uncontrolled nausea, vomiting, diarrhea.

## 2022-11-26 ENCOUNTER — Encounter (HOSPITAL_COMMUNITY): Payer: Self-pay

## 2022-11-26 ENCOUNTER — Ambulatory Visit (HOSPITAL_COMMUNITY)
Admission: EM | Admit: 2022-11-26 | Discharge: 2022-11-26 | Disposition: A | Discharge status: Home / Self Care | Payer: MEDICAID | Attending: Physician Assistant | Admitting: Physician Assistant

## 2022-11-26 DIAGNOSIS — N898 Other specified noninflammatory disorders of vagina: Secondary | ICD-10-CM

## 2022-11-26 DIAGNOSIS — Z113 Encounter for screening for infections with a predominantly sexual mode of transmission: Secondary | ICD-10-CM

## 2022-11-26 DIAGNOSIS — N760 Acute vaginitis: Secondary | ICD-10-CM | POA: Diagnosis not present

## 2022-11-26 LAB — HIV ANTIBODY (ROUTINE TESTING W REFLEX): HIV Screen 4th Generation wRfx: NONREACTIVE

## 2022-11-26 MED ORDER — FLUCONAZOLE 150 MG PO TABS: 150.0000 mg | ORAL_TABLET | ORAL | 0 refills | Status: DC

## 2022-11-26 NOTE — Discharge Instructions (Addendum)
We are going to treat you for yeast infection.  Take 1 dose of Diflucan today and a second dose in 1 week.  I will contact you if any of your testing is abnormal.  Wear loosefitting cotton underwear and use hypoallergenic soaps and detergents.  If you develop any pelvic pain, abdominal pain, fever, nausea, vomiting you need to be seen immediately.  Abstain from sex and to receive the results.  Follow-up with any concerning symptoms.

## 2022-11-26 NOTE — ED Triage Notes (Signed)
Pt states she pull a piece of hair out of her vagina 2 weeks ago. Pt states she had sexual intercourse last night since then she has some vaginal irritation.

## 2022-11-26 NOTE — ED Provider Notes (Signed)
[facility]    CSN: [id number] Arrival date & time: 11/26/22  1224      History   Chief Complaint Chief Complaint  Patient presents with   Vaginitis    HPI Meghan Barrett is a 52 y.o. female.   Patient presents today with a 1 day history of vaginal pain.  She reports that last month she got her hair done and noticed that there was some here near her vagina.  She pulled this out and wonders if this could be contributing to her symptoms.  Her symptoms began after intercourse with her boyfriend yesterday.  They do not use condoms and have not used any new products including lubricants.  She denies any changes to personal hygiene products including soaps or detergents.  She has noticed some thick white discharge but denies any additional symptoms including odor, pelvic pain, abdominal pain, fever, nausea, vomiting.  She has not tried any over-the-counter medication for symptom management.  She reports a history of diabetes but does not take any medication for this on a regular basis including SGLT2 inhibitor.    Past Medical History:  Diagnosis Date   Anxiety    Asthma    Depression    Diabetes mellitus (HCC) dx'd 06/19/2013   Hernia    High cholesterol    Hx of diabetes mellitus 01/27/2020   Schizophrenia [facility])     Patient Active Problem List   Diagnosis Date Noted   Osteoarthritis 08/05/2022   Prediabetes 06/28/2022   Vaginal discomfort 05/13/2022   Hot flushes, perimenopausal 12/16/2021   Healthcare maintenance 09/11/2021   Rash 11/06/2020   Vaginal discharge 05/22/2020   Hx of diabetes mellitus 01/27/2020   Gastroesophageal reflux disease 01/27/2020   Asthma in adult, moderate persistent, uncomplicated 12/13/2019   Hyperlipidemia 08/17/2015   Schizophrenia (HCC) 06/19/2013    Past Surgical History:  Procedure Laterality Date   APPENDECTOMY  08-06-15   CESAREAN SECTION  1991; 1999; 2002   FINGER FRACTURE SURGERY Left 2010   TUBAL LIGATION  2002    OB  History   No obstetric history on file.      Home Medications    Prior to Admission medications   Medication Sig Start Date End Date Taking? Authorizing Provider  albuterol (VENTOLIN HFA) 108 (90 Base) MCG/ACT inhaler Inhale 2 puffs into the lungs every 6 (six) hours as needed for wheezing or shortness of breath. 03/24/21  Yes Autry-Lott, Randa Evens, DO  ARIPiprazole (ABILIFY) 10 MG tablet Take by mouth. 03/06/20  Yes [provider]  cetirizine (ZYRTEC) 10 MG tablet Take one pill daily 07/15/22  Yes Littie Deeds, MD  cyclobenzaprine (FLEXERIL) 10 MG tablet Take 1 tablet (10 mg total) by mouth 2 (two) times daily as needed for muscle spasms. 11/23/22  Yes Carroll Sage, PA-C  diclofenac Sodium (VOLTAREN) 1 % GEL Apply 2 g topically 4 (four) times daily. Rub into affected area of foot 2 to 4 times daily 08/05/22  Yes Alicia Amel, MD  famotidine (PEPCID) 10 MG tablet Take 1 tablet (10 mg total) by mouth 2 (two) times daily. 08/05/22  Yes Alicia Amel, MD  fluconazole (DIFLUCAN) 150 MG tablet Take 1 tablet (150 mg total) by mouth once a week for 2 doses. 11/26/22 12/04/22 Yes Jing Howatt K, PA-C  FLUoxetine (PROZAC) 20 MG capsule Take 20 mg by mouth daily. 12/02/19  Yes [provider]  fluticasone (FLONASE) 50 MCG/ACT nasal spray Place 1 spray into both nostrils daily. 07/15/22  Yes  Littie Deeds, MD  methocarbamol (ROBAXIN) 500 MG tablet Take 1 tablet (500 mg total) by mouth 2 (two) times daily as needed for muscle spasms. 07/15/22  Yes Littie Deeds, MD  montelukast (SINGULAIR) 10 MG tablet TAKE 1 TABLET(10 MG) BY MOUTH AT BEDTIME 08/05/22  Yes Alicia Amel, MD  naproxen (NAPROSYN) 500 MG tablet Take 1 tablet (500 mg total) by mouth 2 (two) times daily. 11/18/22  Yes Durwin Glaze, MD  polyethylene glycol powder (GLYCOLAX/MIRALAX) 17 GM/SCOOP powder Take 17 g by mouth daily. 01/11/21  Yes Autry-Lott, Randa Evens, DO  rosuvastatin (CRESTOR) 10 MG tablet Take 1 tablet (10 mg total)  by mouth daily. 06/24/22 06/24/23 Yes Sowell, Apolinar Junes, MD  traZODone (DESYREL) 100 MG tablet Take 100 mg by mouth at bedtime. 11/23/22  Yes [provider]  traZODone (DESYREL) 50 MG tablet Take by mouth. 03/06/20  Yes [provider]  valACYclovir (VALTREX) 1000 MG tablet Take 1 tablet (1,000 mg total) by mouth 3 (three) times daily. 08/09/22  Yes Lamptey, Britta Mccreedy, MD    Family History Family History  Problem Relation Age of Onset   Alcohol abuse Mother    Drug abuse Mother    Diabetes Mother    Alcohol abuse Father    Drug abuse Father    Heart attack Father    Diabetes Father    Diabetes Sister    Early death Sister    High Cholesterol Sister    Breast cancer Maternal Grandmother    Cancer Other    Stroke Other     Social History Social History   Tobacco Use   Smoking status: Former    Types: Cigarettes   Smokeless tobacco: Never   Tobacco comments:    06/19/2013 "smoke ~ 5 cigarettes/month"  Vaping Use   Vaping status: Never Used  Substance Use Topics   Alcohol use: Not Currently    Alcohol/week: 1.0 standard drink of alcohol    Types: 1 Cans of beer per week   Drug use: Not Currently    Types: "Crack" cocaine    Comment: 06/19/2013 "got off crack cocaine almost 1 yr ago"     Allergies   Penicillins   Review of Systems Review of Systems  Constitutional:  Negative for activity change, appetite change, fatigue and fever.  Gastrointestinal:  Negative for abdominal pain, diarrhea, nausea and vomiting.  Genitourinary:  Positive for vaginal discharge and vaginal pain. Negative for dysuria, frequency, pelvic pain, urgency and vaginal bleeding.  Musculoskeletal:  Negative for arthralgias and myalgias.     Physical Exam Triage Vital Signs ED Triage Vitals [11/26/22 1339]  Encounter Vitals Group     BP 108/74     Systolic BP Percentile      Diastolic BP Percentile      Pulse Rate 63     Resp 16     Temp (!) 97.3 F (36.3 C)     Temp Source Oral      SpO2 98 %     Weight      Height      Head Circumference      Peak Flow      Pain Score      Pain Loc      Pain Education      Exclude from Growth Chart    No data found.  Updated Vital Signs BP 108/74 (BP Location: Left Arm)   Pulse 63   Temp (!) 97.3 F (36.3 C) (Oral)   Resp 16  SpO2 98%   Visual Acuity Right Eye Distance:   Left Eye Distance:   Bilateral Distance:    Right Eye Near:   Left Eye Near:    Bilateral Near:     Physical Exam Vitals reviewed. Exam conducted with a chaperone present.  Constitutional:      General: She is awake. She is not in acute distress.    Appearance: Normal appearance. She is well-developed. She is not ill-appearing.     Comments: Very pleasant female appears stated age in no acute distress sitting comfortably in exam room  HENT:     Head: Normocephalic and atraumatic.  Cardiovascular:     Rate and Rhythm: Normal rate and regular rhythm.     Heart sounds: Normal heart sounds, S1 normal and S2 normal. No murmur heard. Pulmonary:     Effort: Pulmonary effort is normal.     Breath sounds: Normal breath sounds. No wheezing, rhonchi or rales.     Comments: Clear to auscultation bilaterally Abdominal:     General: Bowel sounds are normal.     Palpations: Abdomen is soft.     Tenderness: There is no abdominal tenderness. There is no right CVA tenderness, left CVA tenderness, guarding or rebound.     Comments: Benign abdominal exam  Genitourinary:    Labia:        Right: No rash or tenderness.        Left: No rash or tenderness.      Vagina: No foreign body. Vaginal discharge and erythema present.     Cervix: Normal. No cervical motion tenderness.     Uterus: Normal.      Adnexa: Right adnexa normal and left adnexa normal.     Comments: Lurena Joiner Rising, PA present as chaperone during exam.  Significant erythema and irritation of vaginal walls with thick white discharge noted posterior vaginal vault.  Pain with bimanual exam at  introitus but no significant CMT or adnexal tenderness. Psychiatric:        Behavior: Behavior is cooperative.      UC Treatments / Results  Labs (all labs ordered are listed, but only abnormal results are displayed) Labs Reviewed  HIV ANTIBODY (ROUTINE TESTING W REFLEX)  RPR  CERVICOVAGINAL ANCILLARY ONLY    EKG   Radiology No results found.  Procedures Procedures (including critical care time)  Medications Ordered in UC Medications - No data to display  Initial Impression / Assessment and Plan / UC Course  I have reviewed the triage vital signs and the nursing notes.  Pertinent labs & imaging results that were available during my care of the patient were reviewed by me and considered in my medical decision making (see chart for details).     Patient is well-appearing, afebrile, nontoxic, nontachycardic.  Vital signs and physical exam are reassuring with no indication for emergent evaluation or imaging.  Concern for yeast infection given clinical presentation and exam findings.  Low suspicion for PID given she had no significant adnexal tenderness or CMT tenderness and only had pain at the introitus.  Will treat with Diflucan today and a second dose in 1 week.  STI swab was collected and is pending.  We will contact her if anything else is positive will need to arrange treatment.  She was interested in a complete STI panel also HIV and syphilis testing were obtained.  She had negative hepatitis C testing a few years ago and denies any specific concern for exposure.  She was encouraged to  use hypoallergenic soaps and detergents and wear loosefitting cotton underwear.  Discussed that if she has any worsening or changing symptoms including abdominal pain, pelvic pain, fever, nausea, vomiting she needs to be seen immediately.  Strict return precautions given.  All questions answered to patient satisfaction.  Final Clinical Impressions(s) / UC Diagnoses   Final diagnoses:  Acute  vaginitis  Vaginal discharge  Screening examination for STI     Discharge Instructions      We are going to treat you for yeast infection.  Take 1 dose of Diflucan today and a second dose in 1 week.  I will contact you if any of your testing is abnormal.  Wear loosefitting cotton underwear and use hypoallergenic soaps and detergents.  If you develop any pelvic pain, abdominal pain, fever, nausea, vomiting you need to be seen immediately.  Abstain from sex and to receive the results.  Follow-up with any concerning symptoms.     ED Prescriptions     Medication Sig Dispense Auth. Provider   fluconazole (DIFLUCAN) 150 MG tablet Take 1 tablet (150 mg total) by mouth once a week for 2 doses. 2 tablet Analisia Kingsford, Noberto Retort, PA-C      PDMP not reviewed this encounter.   Jeani Hawking, PA-C 11/26/22 1423

## 2022-11-27 ENCOUNTER — Telehealth (HOSPITAL_COMMUNITY): Payer: Self-pay | Admitting: Emergency Medicine

## 2022-11-27 LAB — RPR: RPR Ser Ql: NONREACTIVE

## 2022-11-27 MED ORDER — FLUCONAZOLE 150 MG PO TABS: 150.0000 mg | ORAL_TABLET | Freq: Once | ORAL | 0 refills | Status: DC | PRN

## 2022-11-27 NOTE — Telephone Encounter (Signed)
Patient called stating she picked up her fluconazole but then lost the pills. She is requesting new prescription be sent. I have sent 2 dose fluconazole to the pharmacy on file.

## 2022-11-28 LAB — CERVICOVAGINAL ANCILLARY ONLY
Bacterial Vaginitis (gardnerella): NEGATIVE
Candida Glabrata: NEGATIVE
Candida Vaginitis: NEGATIVE
Chlamydia: NEGATIVE
Comment: NEGATIVE
Comment: NEGATIVE
Comment: NEGATIVE
Comment: NEGATIVE
Comment: NEGATIVE
Comment: NORMAL
Neisseria Gonorrhea: NEGATIVE
Trichomonas: NEGATIVE

## 2022-11-30 ENCOUNTER — Ambulatory Visit (HOSPITAL_COMMUNITY)
Admission: EM | Admit: 2022-11-30 | Discharge: 2022-11-30 | Disposition: A | Discharge status: Home / Self Care | Payer: MEDICAID | Attending: Emergency Medicine | Admitting: Emergency Medicine

## 2022-11-30 ENCOUNTER — Encounter (HOSPITAL_COMMUNITY): Payer: Self-pay

## 2022-11-30 DIAGNOSIS — B009 Herpesviral infection, unspecified: Secondary | ICD-10-CM

## 2022-11-30 MED ORDER — VALACYCLOVIR HCL 1 G PO TABS: 1000.0000 mg | ORAL_TABLET | Freq: Three times a day (TID) | ORAL | 0 refills | Status: DC

## 2022-11-30 NOTE — Discharge Instructions (Signed)
Please take the Valtrex 3 times daily until you are rash is healed or until the medication is gone.  For further refills of your medications, please follow-up with your primary care provider.  Return to clinic for any new or urgent symptoms.

## 2022-11-30 NOTE — ED Triage Notes (Signed)
Pt states that she is here for medication refill.

## 2022-11-30 NOTE — ED Provider Notes (Signed)
[facility]    CSN: [id number] Arrival date & time: 11/30/22  1316      History   Chief Complaint Chief Complaint  Patient presents with   Medication Refill    HPI Meghan Barrett is a 52 y.o. female.   Patient presents to clinic requesting a refill of Valtrex.  She had a painful rash to her right buttock that started Friday and got worse over the weekend.  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   Does have a PCP.     The history is provided by the patient and medical records.  Medication Refill   Past Medical History:  Diagnosis Date   Anxiety    Asthma    Depression    Diabetes mellitus (HCC) dx'd 06/19/2013   Hernia    High cholesterol    Hx of diabetes mellitus 01/27/2020   Schizophrenia [facility])     Patient Active Problem List   Diagnosis Date Noted   Osteoarthritis 08/05/2022   Prediabetes 06/28/2022   Vaginal discomfort 05/13/2022   Hot flushes, perimenopausal 12/16/2021   Healthcare maintenance 09/11/2021   Rash 11/06/2020   Vaginal discharge 05/22/2020   Hx of diabetes mellitus 01/27/2020   Gastroesophageal reflux disease 01/27/2020   Asthma in adult, moderate persistent, uncomplicated 12/13/2019   Hyperlipidemia 08/17/2015   Schizophrenia (HCC) 06/19/2013    Past Surgical History:  Procedure Laterality Date   APPENDECTOMY  08-06-15   CESAREAN SECTION  1991; 1999; 2002   FINGER FRACTURE SURGERY Left 2010   TUBAL LIGATION  2002    OB History   No obstetric history on file.      Home Medications    Prior to Admission medications   Medication Sig Start Date End Date Taking? Authorizing Provider  albuterol (VENTOLIN HFA) 108 (90 Base) MCG/ACT inhaler Inhale 2 puffs into the lungs every 6 (six) hours as needed for wheezing or shortness of breath. 03/24/21  Yes Autry-Lott, Randa Evens, DO  ARIPiprazole (ABILIFY) 10 MG tablet Take by mouth. 03/06/20  Yes [provider]  cetirizine (ZYRTEC) 10 MG tablet Take one  pill daily 07/15/22  Yes Littie Deeds, MD  cyclobenzaprine (FLEXERIL) 10 MG tablet Take 1 tablet (10 mg total) by mouth 2 (two) times daily as needed for muscle spasms. 11/23/22  Yes Carroll Sage, PA-C  diclofenac Sodium (VOLTAREN) 1 % GEL Apply 2 g topically 4 (four) times daily. Rub into affected area of foot 2 to 4 times daily 08/05/22  Yes Alicia Amel, MD  famotidine (PEPCID) 10 MG tablet Take 1 tablet (10 mg total) by mouth 2 (two) times daily. 08/05/22  Yes Alicia Amel, MD  fluconazole (DIFLUCAN) 150 MG tablet Take 1 tablet (150 mg total) by mouth once as needed for up to 2 doses (take one pill on day 1, and the second pill 3 days later). 11/27/22  Yes Rising, Lurena Joiner, PA-C  FLUoxetine (PROZAC) 20 MG capsule Take 20 mg by mouth daily. 12/02/19  Yes [provider]  fluticasone (FLONASE) 50 MCG/ACT nasal spray Place 1 spray into both nostrils daily. 07/15/22  Yes Littie Deeds, MD  methocarbamol (ROBAXIN) 500 MG tablet Take 1 tablet (500 mg total) by mouth 2 (two) times daily as needed for muscle spasms. 07/15/22  Yes Littie Deeds, MD  montelukast (SINGULAIR) 10 MG tablet TAKE 1 TABLET(10 MG) BY MOUTH AT BEDTIME 08/05/22  Yes Alicia Amel, MD  naproxen (NAPROSYN) 500 MG tablet Take 1 tablet (  500 mg total) by mouth 2 (two) times daily. 11/18/22  Yes Durwin Glaze, MD  polyethylene glycol powder (GLYCOLAX/MIRALAX) 17 GM/SCOOP powder Take 17 g by mouth daily. 01/11/21  Yes Autry-Lott, Randa Evens, DO  rosuvastatin (CRESTOR) 10 MG tablet Take 1 tablet (10 mg total) by mouth daily. 06/24/22 06/24/23 Yes Sowell, Apolinar Junes, MD  traZODone (DESYREL) 100 MG tablet Take 100 mg by mouth at bedtime. 11/23/22  Yes [provider]  traZODone (DESYREL) 50 MG tablet Take by mouth. 03/06/20  Yes [provider]  valACYclovir (VALTREX) 1000 MG tablet Take 1 tablet (1,000 mg total) by mouth 3 (three) times daily. 11/30/22  Yes Riniyah Speich, [place] N, FNP    Family History Family History   Problem Relation Age of Onset   Alcohol abuse Mother    Drug abuse Mother    Diabetes Mother    Alcohol abuse Father    Drug abuse Father    Heart attack Father    Diabetes Father    Diabetes Sister    Early death Sister    High Cholesterol Sister    Breast cancer Maternal Grandmother    Cancer Other    Stroke Other     Social History Social History   Tobacco Use   Smoking status: Former    Types: Cigarettes   Smokeless tobacco: Never   Tobacco comments:    06/19/2013 "smoke ~ 5 cigarettes/month"  Vaping Use   Vaping status: Never Used  Substance Use Topics   Alcohol use: Not Currently    Alcohol/week: 1.0 standard drink of alcohol    Types: 1 Cans of beer per week   Drug use: Not Currently    Types: "Crack" cocaine    Comment: 06/19/2013 "got off crack cocaine almost 1 yr ago"     Allergies   Penicillins   Review of Systems Review of Systems  Skin:  Positive for rash.     Physical Exam Triage Vital Signs ED Triage Vitals  Encounter Vitals Group     BP 11/30/22 1429 105/68     Systolic BP Percentile --      Diastolic BP Percentile --      Pulse Rate 11/30/22 1429 69     Resp 11/30/22 1429 18     Temp 11/30/22 1429 97.6 F (36.4 C)     Temp Source 11/30/22 1429 Oral     SpO2 11/30/22 1429 98 %     Weight 11/30/22 1428 145 lb (65.8 kg)     Height 11/30/22 1428 5\' 6"  (1.676 m)     Head Circumference --      Peak Flow --      Pain Score 11/30/22 1427 0     Pain Loc --      Pain Education --      Exclude from Growth Chart --    No data found.  Updated Vital Signs BP 105/68 (BP Location: Right Arm)   Pulse 69   Temp 97.6 F (36.4 C) (Oral)   Resp 18   Ht 5\' 6"  (1.676 m)   Wt 145 lb (65.8 kg)   SpO2 98%   BMI 23.40 kg/m   Visual Acuity Right Eye Distance:   Left Eye Distance:   Bilateral Distance:    Right Eye Near:   Left Eye Near:    Bilateral Near:     Physical Exam Vitals and nursing note reviewed.  Constitutional:       Appearance: Normal appearance.  HENT:  Head: Normocephalic and atraumatic.     Right Ear: External ear normal.     Left Ear: External ear normal.     Nose: Nose normal.     Mouth/Throat:     Mouth: Mucous membranes are moist.  Eyes:     General: No scleral icterus. Cardiovascular:     Rate and Rhythm: Normal rate.  Pulmonary:     Effort: Pulmonary effort is normal. No respiratory distress.  Genitourinary:      Comments: Rash around right rectal area that is vesicular.  Musculoskeletal:        General: Normal range of motion.  Neurological:     General: No focal deficit present.     Mental Status: She is alert and oriented to person, place, and time.  Psychiatric:        Mood and Affect: Mood normal.        Behavior: Behavior normal. Behavior is cooperative.      UC Treatments / Results  Labs (all labs ordered are listed, but only abnormal results are displayed) Labs Reviewed - No data to display  EKG   Radiology No results found.  Procedures Procedures (including critical care time)  Medications Ordered in UC Medications - No data to display  Initial Impression / Assessment and Plan / UC Course  I have reviewed the triage vital signs and the nursing notes.  Pertinent labs & imaging results that were available during my care of the patient were reviewed by me and considered in my medical decision making (see chart for details).  Vitals and triage reviewed, patient is hemodynamically stable.  She does have a rash to her right buttock area that is vesicular in nature.  HSV-2 positive in May, sample was taken from her buttock rash. Responded to Valtrex in the past.  Denies any vaginal sores, blisters or lesions.  Lesions have not spread.  Will refill Valtrex, encouraged to follow-up with primary care provider for further refills if needed.  Plan of care, follow-up care, return precautions given, no questions at this time.     Final Clinical Impressions(s) / UC  Diagnoses   Final diagnoses:  HSV-2 (herpes simplex virus 2) infection     Discharge Instructions      Please take the Valtrex 3 times daily until you are rash is healed or until the medication is gone.  For further refills of your medications, please follow-up with your primary care provider.  Return to clinic for any new or urgent symptoms.     ED Prescriptions     Medication Sig Dispense Auth. Provider   valACYclovir (VALTREX) 1000 MG tablet Take 1 tablet (1,000 mg total) by mouth 3 (three) times daily. 21 tablet Verlean Allport, [place] N, [place]      PDMP not reviewed this encounter.   Taaj Hurlbut, [place] N, [place] 11/30/22 1450

## 2022-12-20 ENCOUNTER — Encounter: Payer: Self-pay | Admitting: Student

## 2022-12-20 ENCOUNTER — Other Ambulatory Visit: Payer: Self-pay

## 2022-12-20 ENCOUNTER — Ambulatory Visit: Payer: MEDICAID | Admitting: Student

## 2022-12-20 VITALS — BP 108/83 | HR 72 | Ht 64.0 in | Wt 144.2 lb

## 2022-12-20 DIAGNOSIS — J4540 Moderate persistent asthma, uncomplicated: Secondary | ICD-10-CM

## 2022-12-20 DIAGNOSIS — M1990 Unspecified osteoarthritis, unspecified site: Secondary | ICD-10-CM

## 2022-12-20 DIAGNOSIS — Z0000 Encounter for general adult medical examination without abnormal findings: Secondary | ICD-10-CM

## 2022-12-20 DIAGNOSIS — Z1211 Encounter for screening for malignant neoplasm of colon: Secondary | ICD-10-CM

## 2022-12-20 DIAGNOSIS — E785 Hyperlipidemia, unspecified: Secondary | ICD-10-CM | POA: Diagnosis not present

## 2022-12-20 DIAGNOSIS — Z8619 Personal history of other infectious and parasitic diseases: Secondary | ICD-10-CM

## 2022-12-20 MED ORDER — FLUTICASONE PROPIONATE 50 MCG/ACT NA SUSP: 1.0000 | Freq: Every day | NASAL | 1 refills | Status: DC

## 2022-12-20 MED ORDER — CETIRIZINE HCL 10 MG PO TABS: ORAL_TABLET | ORAL | 2 refills | Status: DC

## 2022-12-20 MED ORDER — DICLOFENAC SODIUM 1 % EX GEL: 2.0000 g | Freq: Four times a day (QID) | CUTANEOUS | 2 refills | Status: AC

## 2022-12-20 MED ORDER — ROSUVASTATIN CALCIUM 10 MG PO TABS: 10.0000 mg | ORAL_TABLET | Freq: Every day | ORAL | 11 refills | Status: DC

## 2022-12-20 MED ORDER — ALBUTEROL SULFATE HFA 108 (90 BASE) MCG/ACT IN AERS: 2.0000 | INHALATION_SPRAY | Freq: Four times a day (QID) | RESPIRATORY_TRACT | 2 refills | Status: DC | PRN

## 2022-12-20 MED ORDER — MONTELUKAST SODIUM 10 MG PO TABS
ORAL_TABLET | ORAL | 2 refills | Status: AC
Start: 2022-12-20 — End: ?

## 2022-12-20 NOTE — Progress Notes (Unsigned)
    SUBJECTIVE:   CHIEF COMPLAINT / HPI:   Need for Colonoscopy Have previously ordered. Was told she will not be able to make further appointments with [facility] due to frequent no-shows. Still would like to have this done.   History of Trichomonas Treated in April. Has lots of questions because her boyfriend was tested and was negative and therefore did not receive treatment. She's curious if this could have come from "a rag" or if she could be re-infected though she denies any symptoms right now. Offered repeat testing but she declines for now as she is on her period.   Hyperlipidemia Wants her cholesterol checked. Was started on Crestor in February of this year for LDL 155. Was taking her Crestor regularly but stopped because her "arm stopped hurting." (She thought that her arm pain might be a symptom of hyperlipidemia and the resolution in symptoms indicative that she was better.)   PERTINENT  PMH / PSH: Schizophrenia, Asthma, GERD HLD, DM2  OBJECTIVE:   BP 108/83   Pulse 72   Ht 5\' 4"  (1.626 m)   Wt 144 lb 3.2 oz (65.4 kg)   SpO2 99%   BMI 24.75 kg/m   Gen: well-appearing, NAD HENT: neck without LAD Cardio: RRR, no murmur Pulm: Normal WOB on RA, lungs are clear throughout Abd: Non-tender and non-distended Skin: Without rash  ASSESSMENT/PLAN:   Healthcare maintenance Will refer to [facility], she tells me this location is easier for her to get to than [place].  History of trichomoniasis All of her questions answered. Confirmed this is indeed a sexually transmitted infection and that if her husband was not treated, she is at risk of repeat infection. Reassuringly symptom free today. She declines any testing today on account of being on her period. Would be very reasonable to consider repeat testing at follow-up.   Hyperlipidemia Not sure exactly when she stopped her Crestor, but it sounds like it was within the past two weeks?  - Repeat lipid panel today - Educated on  the role of statins      J Dorothyann Gibbs, MD [facility]

## 2022-12-20 NOTE — Patient Instructions (Signed)
Meghan Barrett have a different GI doc reach out for an appointment. Their office is right around the corner from [name] here.  I am re-checking your cholesterol levels today. Be sure you take your rosuvastatin every day!! This is not for arm pain.  I do think you got the trichomonas through sex. I'm glad you're not having any symptoms. With your boyfriend having not been treated, I'm happy to re-test you anytime you'd like in the future.   Eliezer Mccoy, MD

## 2022-12-21 LAB — LIPID PANEL
Chol/HDL Ratio: 4 {ratio} (ref 0.0–4.4)
Cholesterol, Total: 239 mg/dL — ABNORMAL HIGH (ref 100–199)
HDL: 60 mg/dL (ref >39)
LDL Chol Calc (NIH): 162 mg/dL — ABNORMAL HIGH (ref 0–99)
Triglycerides: 96 mg/dL (ref 0–149)
VLDL Cholesterol Cal: 17 mg/dL (ref 5–40)

## 2022-12-22 DIAGNOSIS — Z8619 Personal history of other infectious and parasitic diseases: Secondary | ICD-10-CM | POA: Insufficient documentation

## 2022-12-22 HISTORY — DX: Personal history of other infectious and parasitic diseases: Z86.19

## 2022-12-22 NOTE — Assessment & Plan Note (Signed)
All of her questions answered. Confirmed this is indeed a sexually transmitted infection and that if her husband was not treated, she is at risk of repeat infection. Reassuringly symptom free today. She declines any testing today on account of being on her period. Would be very reasonable to consider repeat testing at follow-up.

## 2022-12-22 NOTE — Assessment & Plan Note (Addendum)
Not sure exactly when she stopped her Crestor, but it sounds like it was within the past two weeks?  - Repeat lipid panel today - Educated on the role of statins

## 2022-12-22 NOTE — Assessment & Plan Note (Signed)
Will refer to [facility], she tells me this location is easier for her to get to than [place].

## 2022-12-23 MED ORDER — ROSUVASTATIN CALCIUM 20 MG PO TABS: 20.0000 mg | ORAL_TABLET | Freq: Every day | ORAL | 3 refills | Status: DC

## 2022-12-23 NOTE — Addendum Note (Signed)
Addended by: Darnelle Spangle B on: 12/23/2022 12:08 PM   Modules accepted: Orders

## 2023-01-02 ENCOUNTER — Other Ambulatory Visit (HOSPITAL_COMMUNITY)
Admission: RE | Admit: 2023-01-02 | Discharge: 2023-01-02 | Disposition: A | Discharge status: Home / Self Care | Payer: MEDICAID | Source: Ambulatory Visit | Attending: Family Medicine | Admitting: Family Medicine

## 2023-01-02 ENCOUNTER — Ambulatory Visit: Payer: MEDICAID | Admitting: Student

## 2023-01-02 ENCOUNTER — Encounter: Payer: Self-pay | Admitting: Student

## 2023-01-02 VITALS — BP 105/62 | HR 60 | Ht 62.0 in | Wt 144.5 lb

## 2023-01-02 DIAGNOSIS — Z113 Encounter for screening for infections with a predominantly sexual mode of transmission: Secondary | ICD-10-CM

## 2023-01-02 LAB — POCT WET PREP (WET MOUNT)
Clue Cells Wet Prep Whiff POC: POSITIVE
Trichomonas Wet Prep HPF POC: ABSENT

## 2023-01-02 NOTE — Progress Notes (Unsigned)
    SUBJECTIVE:   CHIEF COMPLAINT / HPI:   Presenting for pap smear: - pap 2021 NILM with negative HPV - Reports of vaginal tingling but no itching and wants to be checked for yeast  - No UTI symptoms  - Going through menopause  - Denies fever, chills, pain, abdominal pain, discharge that is abnormal -Declines blood testing   PERTINENT  PMH / PSH:  Schizophrenia - Abilify with psychiatry and has counselor  Asthma - Montelukast + Albuterol  GERD - Pepcid  HLD - Crestor 40 mg, UTD on LDL Prediabetes - 04/24 6.1->dietary changes instructed   OBJECTIVE:   BP 105/62   Pulse 60   Ht 5\' 2"  (1.575 m)   Wt 144 lb 8 oz (65.5 kg)   LMP 12/16/2022   BMI 26.43 kg/m   General: Alert and oriented in no apparent distress Lungs: Normal WOB  Abdomen: no abdominal pain Skin: Warm and dry GU exam: Normal genitalia Chaperone present Normal white discharge  Some vaginal atrophy  Normal vulvar exam    ASSESSMENT/PLAN:   Assessment & Plan Routine screening for STI (sexually transmitted infection) Reviewed labs and allergies will check GC/Chlamydia and Trichomonas as well as wet prep and will call patient with results. Pap performed. Patient's questions answered to their satisfaction.      Alfredo Martinez, MD [facility]

## 2023-01-02 NOTE — Patient Instructions (Addendum)
It was great to see you today! Thank you for choosing [facility] for your primary care.  Today we addressed: We will get a pap smear today I will call with lab results    If you haven't already, sign up for My Chart to have easy access to your labs results, and communication with your primary care physician. I recommend that you always bring your medications to each appointment as this makes it easy to ensure you are on the correct medications and helps [name] not miss refills when you need them. Call the clinic at [phone] if your symptoms worsen or you have any concerns. Return in about 3 months (around 04/04/2023), or if symptoms worsen or fail to improve. Please arrive 15 minutes before your appointment to ensure smooth check in process.  We appreciate your efforts in making this happen.  Thank you for allowing me to participate in your care, Alfredo Martinez, MD 01/02/2023, 4:11 PM PGY-3, [facility]

## 2023-01-04 ENCOUNTER — Other Ambulatory Visit: Payer: Self-pay | Admitting: Student

## 2023-01-04 ENCOUNTER — Encounter: Payer: Self-pay | Admitting: Student

## 2023-01-04 DIAGNOSIS — B9689 Other specified bacterial agents as the cause of diseases classified elsewhere: Secondary | ICD-10-CM

## 2023-01-04 MED ORDER — METRONIDAZOLE 500 MG PO TABS
500.0000 mg | ORAL_TABLET | Freq: Two times a day (BID) | ORAL | 0 refills | Status: DC
Start: 2023-01-04 — End: 2023-12-06

## 2023-01-06 LAB — CYTOLOGY - PAP
Adequacy: ABSENT
Chlamydia: NEGATIVE
Comment: NEGATIVE
Comment: NEGATIVE
Comment: NEGATIVE
Comment: NORMAL
Diagnosis: UNDETERMINED — AB
High risk HPV: NEGATIVE
Neisseria Gonorrhea: NEGATIVE
Trichomonas: NEGATIVE

## 2023-01-09 ENCOUNTER — Other Ambulatory Visit (HOSPITAL_COMMUNITY): Payer: Self-pay

## 2023-01-12 ENCOUNTER — Other Ambulatory Visit (HOSPITAL_COMMUNITY): Payer: Self-pay

## 2023-02-13 ENCOUNTER — Encounter: Payer: Self-pay | Admitting: Family Medicine

## 2023-03-23 ENCOUNTER — Other Ambulatory Visit: Payer: Self-pay

## 2023-03-23 MED ORDER — CETIRIZINE HCL 10 MG PO TABS: ORAL_TABLET | ORAL | 2 refills | Status: DC

## 2023-03-23 MED ORDER — VALACYCLOVIR HCL 1 G PO TABS: 1000.0000 mg | ORAL_TABLET | Freq: Three times a day (TID) | ORAL | 0 refills | Status: DC

## 2023-03-23 NOTE — Telephone Encounter (Signed)
 Patient calls nurse line requesting refills on Valtrex and Zyrtec.   She reports she never picked up the prescription for Zyrtec sent on 10/1 therefore they are needing a new prescription.   Advised will forward to PCP.

## 2023-04-25 ENCOUNTER — Encounter (HOSPITAL_COMMUNITY): Payer: Self-pay

## 2023-04-25 ENCOUNTER — Ambulatory Visit (HOSPITAL_COMMUNITY)
Admission: EM | Admit: 2023-04-25 | Discharge: 2023-04-25 | Disposition: A | Discharge status: Home / Self Care | Payer: MEDICAID

## 2023-04-25 DIAGNOSIS — K029 Dental caries, unspecified: Secondary | ICD-10-CM

## 2023-04-25 DIAGNOSIS — B009 Herpesviral infection, unspecified: Secondary | ICD-10-CM | POA: Diagnosis not present

## 2023-04-25 DIAGNOSIS — K047 Periapical abscess without sinus: Secondary | ICD-10-CM | POA: Diagnosis not present

## 2023-04-25 MED ORDER — VALACYCLOVIR HCL 1 G PO TABS: 1000.0000 mg | ORAL_TABLET | Freq: Three times a day (TID) | ORAL | 0 refills | Status: AC

## 2023-04-25 MED ORDER — DOXYCYCLINE HYCLATE 100 MG PO TABS: 100.0000 mg | ORAL_TABLET | Freq: Two times a day (BID) | ORAL | 0 refills | Status: AC

## 2023-04-25 NOTE — ED Provider Notes (Signed)
 [facility]    CSN: [id number] Arrival date & time: 04/25/23  0804      History   Chief Complaint Chief Complaint  Patient presents with   Dental Pain   Headache    HPI Meghan Barrett is a 53 y.o. female.   HPI  She reports she has chills, headache and tooth pain that started 2 days ago  She states she is scheduled to have the tooth examined and potentially extracted later this week, either tomorrow or the day after  She has seen dentist previously about the tooth. She states they have not put her on medication for this yet   Interventions: Aleve  at midnight and another at 4 AM   Patient reports she has had a rash along the left buttocks for about a week  She reports she has been using OTC cream to help with the irritation    Past Medical History:  Diagnosis Date   Anxiety    Asthma    Depression    Diabetes mellitus (HCC) dx'd 06/19/2013   Hernia    High cholesterol    Hx of diabetes mellitus 01/27/2020   Schizophrenia [facility])     Patient Active Problem List   Diagnosis Date Noted   History of trichomoniasis 12/22/2022   Osteoarthritis 08/05/2022   Prediabetes 06/28/2022   Hot flushes, perimenopausal 12/16/2021   Healthcare maintenance 09/11/2021   Hx of diabetes mellitus 01/27/2020   Gastroesophageal reflux disease 01/27/2020   Asthma in adult, moderate persistent, uncomplicated 12/13/2019   Hyperlipidemia 08/17/2015   Schizophrenia (HCC) 06/19/2013    Past Surgical History:  Procedure Laterality Date   APPENDECTOMY  08-06-15   CESAREAN SECTION  1991; 1999; 2002   FINGER FRACTURE SURGERY Left 2010   TUBAL LIGATION  2002    OB History   No obstetric history on file.      Home Medications    Prior to Admission medications   Medication Sig Start Date End Date Taking? Authorizing Provider  doxycycline  (VIBRA -TABS) 100 MG tablet Take 1 tablet (100 mg total) by mouth 2 (two) times daily for 7 days. 04/25/23 05/02/23 Yes Tomas Schamp E, PA-C   omeprazole  (PRILOSEC) 20 MG capsule Take by mouth. 04/06/23 05/06/23 Yes [provider]  valACYclovir  (VALTREX ) 1000 MG tablet Take 1 tablet (1,000 mg total) by mouth 3 (three) times daily for 10 days. 05/03/23 05/13/23 Yes Mckay Brandt E, PA-C  albuterol  (VENTOLIN  HFA) 108 (90 Base) MCG/ACT inhaler Inhale 2 puffs into the lungs every 6 (six) hours as needed for wheezing or shortness of breath. 12/20/22   Marlee Lynwood NOVAK, MD  ARIPiprazole (ABILIFY) 10 MG tablet Take by mouth. 03/06/20  Yes [provider]  cetirizine  (ZYRTEC ) 10 MG tablet Take one pill daily 03/23/23  Yes Sowell, Penne, MD  diclofenac  Sodium (VOLTAREN ) 1 % GEL Apply 2 g topically 4 (four) times daily. Rub into affected area of foot 2 to 4 times daily 12/20/22  Yes Marlee Lynwood NOVAK, MD  famotidine  (PEPCID ) 10 MG tablet Take 1 tablet (10 mg total) by mouth 2 (two) times daily. 08/05/22  Yes Marlee Lynwood NOVAK, MD  FLUoxetine (PROZAC) 20 MG capsule Take 20 mg by mouth daily. 12/02/19  Yes [provider]  fluticasone  (FLONASE ) 50 MCG/ACT nasal spray Place 1 spray into both nostrils daily. 12/20/22  Yes Marlee Lynwood NOVAK, MD  methocarbamol  (ROBAXIN ) 500 MG tablet Take 1 tablet (500 mg total) by mouth 2 (two) times daily as needed for muscle spasms.  07/15/22   Austin Ade, MD  metroNIDAZOLE  (FLAGYL ) 500 MG tablet Take 1 tablet (500 mg total) by mouth 2 (two) times daily. For 7 days. Do not drink alcohol while taking this medication. 01/04/23   Bryan Bianchi, MD  montelukast  (SINGULAIR ) 10 MG tablet TAKE 1 TABLET(10 MG) BY MOUTH AT BEDTIME 12/20/22   Marlee Lynwood NOVAK, MD  naproxen  (NAPROSYN ) 500 MG tablet Take 1 tablet (500 mg total) by mouth 2 (two) times daily. 11/18/22   Ula Prentice SAUNDERS, MD  polyethylene glycol powder (GLYCOLAX /MIRALAX ) 17 GM/SCOOP powder Take 17 g by mouth daily. 01/11/21   Autry-Lott, Rojean, DO  rosuvastatin  (CRESTOR ) 20 MG tablet Take 1 tablet (20 mg total) by mouth daily. 12/23/22   Marlee Lynwood NOVAK, MD   traZODone (DESYREL) 100 MG tablet Take 100 mg by mouth at bedtime. 11/23/22   [provider]  traZODone (DESYREL) 50 MG tablet Take by mouth. 03/06/20   [provider]    Family History Family History  Problem Relation Age of Onset   Alcohol abuse Mother    Drug abuse Mother    Diabetes Mother    Alcohol abuse Father    Drug abuse Father    Heart attack Father    Diabetes Father    Diabetes Sister    Early death Sister    High Cholesterol Sister    Breast cancer Maternal Grandmother    Cancer Other    Stroke Other     Social History Social History   Tobacco Use   Smoking status: Former    Types: Cigarettes   Smokeless tobacco: Never   Tobacco comments:    06/19/2013 smoke ~ 5 cigarettes/month  Vaping Use   Vaping status: Never Used  Substance Use Topics   Alcohol use: Not Currently    Alcohol/week: 1.0 standard drink of alcohol    Types: 1 Cans of beer per week   Drug use: Not Currently    Types: Crack cocaine    Comment: 06/19/2013 got off crack cocaine almost 1 yr ago     Allergies   Penicillins, Other, and Tomato   Review of Systems Review of Systems  Constitutional:  Positive for chills.  HENT:  Positive for dental problem.   Skin:  Positive for rash.  Neurological:  Positive for headaches.     Physical Exam Triage Vital Signs ED Triage Vitals  Encounter Vitals Group     BP 04/25/23 0833 (!) 153/75     Systolic BP Percentile --      Diastolic BP Percentile --      Pulse Rate 04/25/23 0833 89     Resp 04/25/23 0833 18     Temp 04/25/23 0833 98.1 F (36.7 C)     Temp Source 04/25/23 0833 Oral     SpO2 04/25/23 0833 96 %     Weight --      Height --      Head Circumference --      Peak Flow --      Pain Score 04/25/23 0825 9     Pain Loc --      Pain Education --      Exclude from Growth Chart --    No data found.  Updated Vital Signs BP (!) 153/75 (BP Location: Left Arm)   Pulse 89   Temp 98.1 F (36.7 C)  (Oral)   Resp 18   SpO2 96%   Visual Acuity Right Eye Distance:   Left Eye Distance:  Bilateral Distance:    Right Eye Near:   Left Eye Near:    Bilateral Near:     Physical Exam Vitals reviewed.  Constitutional:      General: She is awake.     Appearance: Normal appearance. She is well-developed and well-groomed.  HENT:     Head: Normocephalic and atraumatic.     Mouth/Throat:     Lips: Pink.     Mouth: Mucous membranes are moist.     Dentition: Gingival swelling, dental caries and dental abscesses present.     Pharynx: Oropharynx is clear. Uvula midline. No oropharyngeal exudate, posterior oropharyngeal erythema or postnasal drip.  Pulmonary:     Effort: Pulmonary effort is normal.  Lymphadenopathy:     Head:     Right side of head: No submental, submandibular or preauricular adenopathy.     Left side of head: No submental, submandibular or preauricular adenopathy.     Cervical:     Right cervical: No superficial cervical adenopathy.    Left cervical: No superficial cervical adenopathy.     Upper Body:     Right upper body: No supraclavicular adenopathy.     Left upper body: No supraclavicular adenopathy.  Skin:    General: Skin is warm and dry.     Findings: Bruising and rash present.       Neurological:     Mental Status: She is alert.  Psychiatric:        Behavior: Behavior is cooperative.      UC Treatments / Results  Labs (all labs ordered are listed, but only abnormal results are displayed) Labs Reviewed - No data to display  EKG   Radiology No results found.  Procedures Procedures (including critical care time)  Medications Ordered in UC Medications - No data to display  Initial Impression / Assessment and Plan / UC Course  I have reviewed the triage vital signs and the nursing notes.  Pertinent labs & imaging results that were available during my care of the patient were reviewed by me and considered in my medical decision making (see  chart for details).      Final Clinical Impressions(s) / UC Diagnoses   Final diagnoses:  Dental caries  Dental abscess  Herpes simplex   Acute, new concern Patient reports that she has been having dental pain along the top right molar area for the past 2 days and has since developed chills, headache, increased pain in the area.  Appears consistent with ill-appearing dental abscess.  She reports that she has an appointment at her dentist for evaluation and probable management.  Will start doxycycline  100 mg p.o. twice daily x 7 days to assist with bacterial coverage.  Recommend prompt dental evaluation and management for more definitive resolution.  Rash She reports ongoing rash for the past week along the left buttocks that is not resolving with home measures.  Area appears more consistent with bruising and mild excoriation.  Unsure if this was previous herpes simplex rash but she does have a history of this in her chart.  She is requesting a refill of her Valtrex .  I have sent in a refill for her to fill on 05/03/2023 as we discussed that her rash has been present for too long at this point to benefit from starting Valtrex .       Discharge Instructions      At this time I suspect that you have a dental infection.  I have sent in a medication called  doxycycline  for you to take twice per day for the next 7 days.  Please make sure that you follow-up with your dentist for more definitive resolution of your symptoms.  If you notice increased swelling, increased pain, drainage from the tooth, fevers, chills please go to the emergency room for prompt evaluation.  At this time I recommend taking Tylenol  as needed for pain and fever management.  Taking NSAIDs Reitnauer can lead to increased bleeding risk especially if you are about to have dental surgery     ED Prescriptions     Medication Sig Dispense Auth. Provider   doxycycline  (VIBRA -TABS) 100 MG tablet Take 1 tablet (100 mg total) by  mouth 2 (two) times daily for 7 days. 14 tablet Anjel Pardo E, PA-C   valACYclovir  (VALTREX ) 1000 MG tablet Take 1 tablet (1,000 mg total) by mouth 3 (three) times daily for 10 days. 30 tablet Equan Cogbill E, PA-C      PDMP not reviewed this encounter.   Marylene Rocky BRAVO, PA-C 04/25/23 9142

## 2023-04-25 NOTE — Discharge Instructions (Addendum)
 At this time I suspect that you have a dental infection.  I have sent in a medication called doxycycline  for you to take twice per day for the next 7 days.  Please make sure that you follow-up with your dentist for more definitive resolution of your symptoms.  If you notice increased swelling, increased pain, drainage from the tooth, fevers, chills please go to the emergency room for prompt evaluation.  At this time I recommend taking Tylenol  as needed for pain and fever management.  Taking NSAIDs Reitnauer can lead to increased bleeding risk especially if you are about to have dental surgery

## 2023-04-25 NOTE — ED Triage Notes (Addendum)
 Since 2/3 she has had a headache, sweats, chills, sore throat, and diarrhea.   Since 1/22 she has had an intermittent dental pain. The pain is located at her right upper wisdom teeth. She is having dental surgery tomorrow to remove the tooth causing the dental pain.   She has been previously told she has herpes. She is having the itchy rash breakout again on her right butt cheek.  Home Interventions: Advil , with little relief (Last dose at 04000); Rash cream, helps relieve itching

## 2023-04-26 ENCOUNTER — Emergency Department (HOSPITAL_COMMUNITY)
Admission: EM | Admit: 2023-04-26 | Discharge: 2023-04-26 | Disposition: A | Discharge status: Home / Self Care | Payer: MEDICAID | Attending: Emergency Medicine | Admitting: Emergency Medicine

## 2023-04-26 ENCOUNTER — Encounter (HOSPITAL_COMMUNITY): Payer: Self-pay

## 2023-04-26 ENCOUNTER — Other Ambulatory Visit: Payer: Self-pay

## 2023-04-26 DIAGNOSIS — M5412 Radiculopathy, cervical region: Secondary | ICD-10-CM | POA: Insufficient documentation

## 2023-04-26 DIAGNOSIS — M79602 Pain in left arm: Secondary | ICD-10-CM | POA: Diagnosis present

## 2023-04-26 MED ORDER — KETOROLAC TROMETHAMINE 15 MG/ML IJ SOLN
15.0000 mg | Freq: Once | INTRAMUSCULAR | Status: DC
  Filled 2023-04-26: qty 1

## 2023-04-26 MED ORDER — DICLOFENAC EPOLAMINE 1.3 % EX PTCH: 1.0000 | MEDICATED_PATCH | Freq: Two times a day (BID) | CUTANEOUS | 1 refills | Status: DC

## 2023-04-26 MED ORDER — DICLOFENAC EPOLAMINE 1.3 % EX PTCH
1.0000 | MEDICATED_PATCH | Freq: Two times a day (BID) | CUTANEOUS | Status: DC
  Administered 2023-04-26: 1 via TRANSDERMAL
  Filled 2023-04-26: qty 1

## 2023-04-26 NOTE — Discharge Instructions (Addendum)
 As discussed, your evaluation today has been largely reassuring.  But, it is important that you monitor your condition carefully, and do not hesitate to return to the ED if you develop new, or concerning changes in your condition.  Otherwise, please follow-up with your physician for appropriate ongoing care.  If the medication prescribed is not available a replacement that is similar may be available over-the-counter.  Please ask the pharmacist.
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
## 2023-04-26 NOTE — ED Triage Notes (Signed)
 Patient is here for evaluation of back pain and left arm pain. Patient is variable on her timeline of when back pain started, sometimes she reports Monday and other times it was today at 1530. Patient reports pain starts in middle back and goes up her back and into the left arm. Patient reports being seen yesterday and given doxycycline  for a tooth infection. States she is suppose to have dental surgery tomorrow.

## 2023-05-15 ENCOUNTER — Encounter: Payer: MEDICAID | Admitting: Student

## 2023-05-18 ENCOUNTER — Ambulatory Visit (INDEPENDENT_AMBULATORY_CARE_PROVIDER_SITE_OTHER): Payer: MEDICAID | Admitting: Student

## 2023-05-18 ENCOUNTER — Encounter: Payer: Self-pay | Admitting: Student

## 2023-05-18 ENCOUNTER — Other Ambulatory Visit (HOSPITAL_COMMUNITY)
Admission: RE | Admit: 2023-05-18 | Discharge: 2023-05-18 | Disposition: A | Discharge status: Home / Self Care | Payer: MEDICAID | Source: Ambulatory Visit | Attending: Family Medicine | Admitting: Family Medicine

## 2023-05-18 VITALS — BP 98/69 | HR 76 | Ht 65.0 in | Wt 141.0 lb

## 2023-05-18 DIAGNOSIS — N951 Menopausal and female climacteric states: Secondary | ICD-10-CM

## 2023-05-18 DIAGNOSIS — Z1211 Encounter for screening for malignant neoplasm of colon: Secondary | ICD-10-CM | POA: Diagnosis not present

## 2023-05-18 DIAGNOSIS — N898 Other specified noninflammatory disorders of vagina: Secondary | ICD-10-CM | POA: Diagnosis present

## 2023-05-18 DIAGNOSIS — Z8639 Personal history of other endocrine, nutritional and metabolic disease: Secondary | ICD-10-CM | POA: Diagnosis not present

## 2023-05-18 DIAGNOSIS — E785 Hyperlipidemia, unspecified: Secondary | ICD-10-CM

## 2023-05-18 LAB — POCT WET PREP (WET MOUNT)
Clue Cells Wet Prep Whiff POC: NEGATIVE
Trichomonas Wet Prep HPF POC: ABSENT

## 2023-05-18 LAB — POCT GLYCOSYLATED HEMOGLOBIN (HGB A1C): HbA1c, POC (prediabetic range): 6 % (ref 5.7–6.4)

## 2023-05-18 NOTE — Progress Notes (Signed)
    SUBJECTIVE:   Chief compliant/HPI: annual examination  Meghan Barrett is a 53 y.o. who presents today for an annual exam.   Recent BH admission in [facility]  For bizarre bahvior, psychosis and consuion. Tells me that they ultimately did not adjust her meds, she is back on her Abilify and Trazodone and feeling well.   Vaginal Discharge  Present for 1-2 days. No itching or other symptoms. Thinks toilet water splashed in her vagina and caused these symptoms. Would like full STI testing. Tells me she has had two sexual partners since her last visit.   Perimenopause Mentioned at the end of the visit. Tells me she has had hot flashes and vaginal dryness with mild dyspareunia. Also has not had a period in 2+ months. Previously was quite regular. Has had tubal ligation so no possibility of pregnancy.     History tabs reviewed and updated .      OBJECTIVE:   BP 98/69   Pulse 76   Ht 5\' 5"  (1.651 m)   Wt 141 lb (64 kg)   SpO2 100%   BMI 23.46 kg/m   Physical Exam Vitals reviewed. Exam conducted with a chaperone present.  Constitutional:      General: She is not in acute distress. Cardiovascular:     Rate and Rhythm: Normal rate and regular rhythm.     Heart sounds: No murmur heard. Pulmonary:     Effort: Pulmonary effort is normal. No respiratory distress.     Breath sounds: No wheezing or rales.  Genitourinary:    General: Normal vulva.     Cervix: No discharge or lesion.     Comments: There is vaginal dryness and mild atrophy on my exam     ASSESSMENT/PLAN:   Assessment & Plan Vaginal discharge - Wet prep is WNL - GC/Chlamydia, RPR, HIV, Hep C  Colon cancer screening Referred to GI. Have asked [facility] to assist her in getting plugged in here. She has difficulties navigating health system 2/2 her schizophrenia. History of diabetes mellitus Repeat A1c today is 6.0%. Remains with excellent control off all medications. Congratulated on success. - Referred to  ophthalmology for retinal exam, [facility] to assist with making this connection Perimenopause Mentioned at the end of the visit. Deserves a dedicated discussion.  - Will bring back for a dedicated follow-up visit  - Checking liver function today in anticipation of possibly using Veozah in the future     Annual Examination  See AVS for age appropriate recommendations  PHQ score 0, reviewed and discussed.  BP reviewed and at goal.   Considered the following items based upon USPSTF recommendations: Screening for elevated cholesterol:  known hyperlipidemia, on Crestor. Repeat lipid panel today.  HIV testing: ordered Hepatitis C: ordered Hepatitis B: ordered Syphilis if at high risk: ordered GC/CT at high risk, ordered.  Osteoporosis screening considered based upon risk of fracture from [facility] calculator. Major osteoporotic fracture risk is 3.7% over 10 years. DEXA not ordered.    Cervical cancer screening: prior Pap reviewed, repeat due in 2027 Breast cancer screening:  up to date, gets these annually at the breast center  Colorectal cancer screening:  ordered colonoscopy as above  Lung cancer screening: not indicated   Follow up in 2-4 weeks to discuss menopause.    Eliezer Mccoy, MD [facility]

## 2023-05-18 NOTE — Patient Instructions (Addendum)
 Meghan Barrett, Please call [facility] at [phone] to get set up with your colonoscopy.  You will be due for your mammogram this summer. This will be done at the [facility] of [place], right across the street from our clinic. You will need to call to make this appointment. Their number is [phone].  The address is [address], [place], [place] [address]  I will call you if we need to make any changes based on your labs from today.   I will have our care navigators reach out to give you a hand with navigating the health system.   Please make an appointment on your way out to come back and discuss menopause some more.  Eliezer Mccoy, MD

## 2023-05-19 ENCOUNTER — Encounter: Payer: Self-pay | Admitting: Student

## 2023-05-19 ENCOUNTER — Telehealth: Payer: Self-pay | Admitting: *Deleted

## 2023-05-19 LAB — COMPREHENSIVE METABOLIC PANEL
ALT: 13 [IU]/L (ref 0–32)
AST: 21 [IU]/L (ref 0–40)
Albumin: 4.6 g/dL (ref 3.9–4.9)
Alkaline Phosphatase: 70 [IU]/L (ref 44–121)
BUN/Creatinine Ratio: 16 (ref 9–23)
BUN: 17 mg/dL (ref 6–24)
Bilirubin Total: 0.5 mg/dL (ref 0.0–1.2)
CO2: 25 mmol/L (ref 20–29)
Calcium: 9.6 mg/dL (ref 8.7–10.2)
Chloride: 103 mmol/L (ref 96–106)
Creatinine, Ser: 1.07 mg/dL — ABNORMAL HIGH (ref 0.57–1.00)
Globulin, Total: 2.6 g/dL (ref 1.5–4.5)
Glucose: 87 mg/dL (ref 70–99)
Potassium: 4.5 mmol/L (ref 3.5–5.2)
Sodium: 141 mmol/L (ref 134–144)
Total Protein: 7.2 g/dL (ref 6.0–8.5)
eGFR: 63 mL/min/{1.73_m2} (ref >59)

## 2023-05-19 LAB — HIV ANTIBODY (ROUTINE TESTING W REFLEX): HIV Screen 4th Generation wRfx: NONREACTIVE

## 2023-05-19 LAB — CERVICOVAGINAL ANCILLARY ONLY
Chlamydia: NEGATIVE
Comment: NEGATIVE
Comment: NEGATIVE
Comment: NORMAL
Neisseria Gonorrhea: NEGATIVE
Trichomonas: NEGATIVE

## 2023-05-19 LAB — LIPID PANEL
Chol/HDL Ratio: 2.4 {ratio} (ref 0.0–4.4)
Cholesterol, Total: 193 mg/dL (ref 100–199)
HDL: 80 mg/dL (ref >39)
LDL Chol Calc (NIH): 100 mg/dL — ABNORMAL HIGH (ref 0–99)
Triglycerides: 69 mg/dL (ref 0–149)
VLDL Cholesterol Cal: 13 mg/dL (ref 5–40)

## 2023-05-19 LAB — HCV INTERPRETATION

## 2023-05-19 LAB — RPR: RPR Ser Ql: NONREACTIVE

## 2023-05-19 LAB — HCV AB W REFLEX TO QUANT PCR: HCV Ab: NONREACTIVE

## 2023-05-19 NOTE — Progress Notes (Signed)
 Complex Care Management Note Care Guide Note  05/19/2023 Name: Jayliani Wanner MRN: [id number] DOB: [birth year]   Complex Care Management Outreach Attempts: An unsuccessful telephone outreach was attempted today to offer the patient information about available complex care management services.  Follow Up Plan:  Additional outreach attempts will be made to offer the patient complex care management information and services.   Encounter Outcome:  No Answer  Gwenevere Ghazi  [facility]  [facility] Guide  Direct Dial: [phone]  Fax [phone]

## 2023-05-25 NOTE — Progress Notes (Signed)
 Complex Care Management Note  Care Guide Note 05/25/2023 Name: Meghan Barrett MRN: [id number] DOB: [birth year]  Meghan Barrett is a 53 y.o. year old female who sees Meghan Amel, MD for primary care. I reached out to Meghan Barrett by phone today to offer complex care management services.  Meghan Barrett was given information about Complex Care Management services today including:   The Complex Care Management services include support from the care team which includes your Nurse Care Manager, Clinical Social Worker, or Pharmacist.  The Complex Care Management team is here to help remove barriers to the health concerns and goals most important to you. Complex Care Management services are voluntary, and the patient may decline or stop services at any time by request to their care team member.   Complex Care Management Consent Status: Patient agreed to services and verbal consent obtained.   Follow up plan:  Telephone appointment with complex care management team member scheduled for:  3/12  Encounter Outcome:  Patient Scheduled  Meghan Barrett  [facility]  [facility], [facility] Guide  Direct Dial: [phone]  Fax [phone]

## 2023-05-31 ENCOUNTER — Telehealth: Payer: Self-pay

## 2023-05-31 NOTE — Patient Outreach (Signed)
 Care Coordination   05/31/2023 Name: Meghan Barrett MRN: [id number] DOB: 02/07/1971   Care Coordination Outreach Attempts:  An unsuccessful telephone outreach was attempted today to offer the patient information about available complex care management services.  Follow Up Plan:  Additional outreach attempts will be made to offer the patient complex care management information and services.   Encounter Outcome:  No Answer   Care Coordination Interventions:  No, not indicated    Lonzo Candy, BSN, [facility] [place]  [facility], [facility]  Care Coordinator Phone: [phone]

## 2023-06-02 ENCOUNTER — Telehealth: Payer: Self-pay

## 2023-06-02 NOTE — Patient Outreach (Signed)
 Care Coordination   06/02/2023 Name: Elleanor Guyett MRN: [id number] DOB: [birth year]   Care Coordination Outreach Attempts:  A second unsuccessful outreach was attempted today to offer the patient with information about available complex care management services.  Follow Up Plan:  Additional outreach attempts will be made to offer the patient complex care management information and services.   Encounter Outcome:  No Answer   Care Coordination Interventions:  No, not indicated    Lonzo Candy, BSN, [facility]  [facility], [facility]  Care Coordinator Phone: [phone]

## 2023-06-06 ENCOUNTER — Telehealth: Payer: Self-pay

## 2023-06-06 NOTE — Patient Outreach (Signed)
 Care Coordination   06/06/2023 Name: Meghan Barrett MRN: [id number] DOB: [birth year]   Care Coordination Outreach Attempts:  A third unsuccessful outreach was attempted today to offer the patient with information about available complex care management services.  Follow Up Plan:  No further outreach attempts will be made at this time. We have been unable to contact the patient to offer or enroll patient in complex care management services.  Encounter Outcome:  No Answer   Care Coordination Interventions:  No, not indicated    Lonzo Candy, BSN, [facility]  [facility], [facility]  Care Coordinator Phone: [phone]

## 2023-06-15 ENCOUNTER — Ambulatory Visit: Payer: Self-pay | Admitting: Student

## 2023-10-27 ENCOUNTER — Encounter (HOSPITAL_COMMUNITY): Payer: Self-pay | Admitting: *Deleted

## 2023-10-27 ENCOUNTER — Ambulatory Visit (HOSPITAL_COMMUNITY)
Admission: EM | Admit: 2023-10-27 | Discharge: 2023-10-27 | Disposition: A | Discharge status: Home / Self Care | Payer: MEDICAID

## 2023-10-27 ENCOUNTER — Other Ambulatory Visit: Payer: Self-pay

## 2023-10-27 DIAGNOSIS — R197 Diarrhea, unspecified: Secondary | ICD-10-CM | POA: Diagnosis not present

## 2023-10-27 DIAGNOSIS — R112 Nausea with vomiting, unspecified: Secondary | ICD-10-CM | POA: Diagnosis not present

## 2023-10-27 NOTE — ED Triage Notes (Signed)
 C/O vomiting and diarrhea onset 2 days ago. States vomiting has resolved, but continues with diarrhea. Denies any bloody stool. States was having hot flashes. Denies any pain.

## 2023-10-27 NOTE — ED Provider Notes (Signed)
 [facility]    CSN: [id number] Arrival date & time: 10/27/23  1545      History   Chief Complaint Chief Complaint  Patient presents with   Diarrhea    HPI Meghan Barrett is a 53 y.o. female.   53 year old female who presents urgent care with complaints of nausea, vomiting and diarrhea.  This started on Wednesday after she ate at [employer].  She reports that this morning the nausea and vomiting have resolved but she continues to have some diarrhea.  She is able to eat and drink now.  She denies any fevers, chills, dysuria, hematuria, headaches or sick exposures.  She is having some abdominal cramping but that is improving as well now.   Diarrhea Associated symptoms: vomiting (resolved now)   Associated symptoms: no abdominal pain, no arthralgias, no chills and no fever     Past Medical History:  Diagnosis Date   Anxiety    Asthma    Depression    Diabetes mellitus (HCC) dx'd 06/19/2013   Hernia    High cholesterol    Hx of diabetes mellitus 01/27/2020   Schizophrenia [facility])     Patient Active Problem List   Diagnosis Date Noted   History of trichomoniasis 12/22/2022   Osteoarthritis 08/05/2022   Prediabetes 06/28/2022   Hot flushes, perimenopausal 12/16/2021   Healthcare maintenance 09/11/2021   Hx of diabetes mellitus 01/27/2020   Gastroesophageal reflux disease 01/27/2020   Asthma in adult, moderate persistent, uncomplicated 12/13/2019   Hyperlipidemia 08/17/2015   Schizophrenia (HCC) 06/19/2013    Past Surgical History:  Procedure Laterality Date   APPENDECTOMY  08-06-15   CESAREAN SECTION  1991; 1999; 2002   FINGER FRACTURE SURGERY Left 2010   TUBAL LIGATION  2002    OB History   No obstetric history on file.      Home Medications    Prior to Admission medications   Medication Sig Start Date End Date Taking? Authorizing Provider  ARIPiprazole (ABILIFY) 10 MG tablet Take by mouth. 03/06/20  Yes [provider]  cetirizine  (ZYRTEC )  10 MG tablet Take one pill daily 03/23/23  Yes Sowell, Penne, MD  diclofenac  (FLECTOR ) 1.3 % PTCH Place 1 patch onto the skin 2 (two) times daily. 04/26/23  Yes Garrick Charleston, MD  diclofenac  Sodium (VOLTAREN ) 1 % GEL Apply 2 g topically 4 (four) times daily. Rub into affected area of foot 2 to 4 times daily 12/20/22  Yes Marlee Lynwood NOVAK, MD  famotidine  (PEPCID ) 10 MG tablet Take 1 tablet (10 mg total) by mouth 2 (two) times daily. 08/05/22  Yes Marlee Lynwood NOVAK, MD  FLUoxetine (PROZAC) 20 MG capsule Take 20 mg by mouth daily. 12/02/19  Yes [provider]  fluticasone  (FLONASE ) 50 MCG/ACT nasal spray Place 1 spray into both nostrils daily. 12/20/22  Yes Marlee Lynwood NOVAK, MD  methocarbamol  (ROBAXIN ) 500 MG tablet Take 1 tablet (500 mg total) by mouth 2 (two) times daily as needed for muscle spasms. 07/15/22  Yes Austin Ade, MD  montelukast  (SINGULAIR ) 10 MG tablet TAKE 1 TABLET(10 MG) BY MOUTH AT BEDTIME 12/20/22  Yes Marlee Lynwood NOVAK, MD  rosuvastatin  (CRESTOR ) 20 MG tablet Take 1 tablet (20 mg total) by mouth daily. 12/23/22  Yes Marlee Lynwood NOVAK, MD  traZODone (DESYREL) 100 MG tablet Take 100 mg by mouth at bedtime. 11/23/22  Yes [provider]  traZODone (DESYREL) 50 MG tablet Take by mouth. 03/06/20  Yes [provider]  albuterol  (VENTOLIN  HFA) 108 (90  Base) MCG/ACT inhaler Inhale 2 puffs into the lungs every 6 (six) hours as needed for wheezing or shortness of breath. 12/20/22   Marlee Lynwood NOVAK, MD  metroNIDAZOLE  (FLAGYL ) 500 MG tablet Take 1 tablet (500 mg total) by mouth 2 (two) times daily. For 7 days. Do not drink alcohol while taking this medication. 01/04/23   Bryan Bianchi, MD  naproxen  (NAPROSYN ) 500 MG tablet Take 1 tablet (500 mg total) by mouth 2 (two) times daily. 11/18/22   Ula Prentice SAUNDERS, MD  polyethylene glycol powder (GLYCOLAX /MIRALAX ) 17 GM/SCOOP powder Take 17 g by mouth daily. 01/11/21   Autry-Lott, Rojean, DO    Family History Family History  Problem  Relation Age of Onset   Alcohol abuse Mother    Drug abuse Mother    Diabetes Mother    Alcohol abuse Father    Drug abuse Father    Heart attack Father    Diabetes Father    Diabetes Sister    Early death Sister    High Cholesterol Sister    Breast cancer Maternal Grandmother    Cancer Other    Stroke Other     Social History Social History   Tobacco Use   Smoking status: Former    Types: Cigarettes   Smokeless tobacco: Never   Tobacco comments:    06/19/2013 smoke ~ 5 cigarettes/month  Vaping Use   Vaping status: Never Used  Substance Use Topics   Alcohol use: Not Currently   Drug use: Not Currently    Types: Crack cocaine     Allergies   Penicillins, Other, and Tomato   Review of Systems Review of Systems  Constitutional:  Negative for chills and fever.  HENT:  Negative for ear pain and sore throat.   Eyes:  Negative for pain and visual disturbance.  Respiratory:  Negative for cough and shortness of breath.   Cardiovascular:  Negative for chest pain and palpitations.  Gastrointestinal:  Positive for diarrhea, nausea (resolved now) and vomiting (resolved now). Negative for abdominal pain.  Genitourinary:  Negative for dysuria and hematuria.  Musculoskeletal:  Negative for arthralgias and back pain.  Skin:  Negative for color change and rash.  Neurological:  Negative for seizures and syncope.  All other systems reviewed and are negative.    Physical Exam Triage Vital Signs ED Triage Vitals  Encounter Vitals Group     BP 10/27/23 1605 134/75     Girls Systolic BP Percentile --      Girls Diastolic BP Percentile --      Boys Systolic BP Percentile --      Boys Diastolic BP Percentile --      Pulse Rate 10/27/23 1605 64     Resp 10/27/23 1605 16     Temp 10/27/23 1605 97.6 F (36.4 C)     Temp Source 10/27/23 1605 Oral     SpO2 10/27/23 1605 98 %     Weight --      Height --      Head Circumference --      Peak Flow --      Pain Score 10/27/23  1606 0     Pain Loc --      Pain Education --      Exclude from Growth Chart --    No data found.  Updated Vital Signs BP 134/75   Pulse 64   Temp 97.6 F (36.4 C) (Oral)   Resp 16   SpO2 98%   Visual  Acuity Right Eye Distance:   Left Eye Distance:   Bilateral Distance:    Right Eye Near:   Left Eye Near:    Bilateral Near:     Physical Exam Vitals and nursing note reviewed.  Constitutional:      General: She is not in acute distress.    Appearance: She is well-developed.  HENT:     Head: Normocephalic and atraumatic.  Eyes:     Conjunctiva/sclera: Conjunctivae normal.  Cardiovascular:     Rate and Rhythm: Normal rate and regular rhythm.     Heart sounds: No murmur heard. Pulmonary:     Effort: Pulmonary effort is normal. No respiratory distress.     Breath sounds: Normal breath sounds.  Abdominal:     General: Bowel sounds are normal.     Palpations: Abdomen is soft.     Tenderness: There is generalized abdominal tenderness (very mild). There is no right CVA tenderness, left CVA tenderness, guarding or rebound.     Hernia: A hernia is present. Hernia is present in the ventral area.  Musculoskeletal:        General: No swelling.     Cervical back: Neck supple.  Skin:    General: Skin is warm and dry.     Capillary Refill: Capillary refill takes less than 2 seconds.  Neurological:     Mental Status: She is alert.  Psychiatric:        Mood and Affect: Mood normal.      UC Treatments / Results  Labs (all labs ordered are listed, but only abnormal results are displayed) Labs Reviewed - No data to display  EKG   Radiology No results found.  Procedures Procedures (including critical care time)  Medications Ordered in UC Medications - No data to display  Initial Impression / Assessment and Plan / UC Course  I have reviewed the triage vital signs and the nursing notes.  Pertinent labs & imaging results that were available during my care of the  patient were reviewed by me and considered in my medical decision making (see chart for details).     Nausea vomiting and diarrhea   Diarrhea possibly due to foodborne illness versus viral.  Reassuringly you are able to eat and drink now.  Recommend staying hydrated by drinking plenty of water or drinks containing electrolytes.  Monitor symptoms.  If you feel that things are worsening then recommend returning to urgent care or going to the emergency room for further evaluation  Final Clinical Impressions(s) / UC Diagnoses   Final diagnoses:  Nausea vomiting and diarrhea     Discharge Instructions      Diarrhea possibly due to foodborne illness versus viral.  Reassuringly you are able to eat and drink now.  Recommend staying hydrated by drinking plenty of water or drinks containing electrolytes.  Monitor symptoms.  If you feel that things are worsening then recommend returning to urgent care or going to the emergency room for further evaluation    ED Prescriptions   None    PDMP not reviewed this encounter.   Teresa Almarie LABOR, PA-C 10/27/23 1640

## 2023-10-27 NOTE — Discharge Instructions (Addendum)
 Diarrhea possibly due to foodborne illness versus viral.  Reassuringly you are able to eat and drink now.  Recommend staying hydrated by drinking plenty of water or drinks containing electrolytes.  Monitor symptoms.  If you feel that things are worsening then recommend returning to urgent care or going to the emergency room for further evaluation

## 2023-12-03 ENCOUNTER — Encounter (HOSPITAL_COMMUNITY): Payer: Self-pay | Admitting: *Deleted

## 2023-12-03 ENCOUNTER — Ambulatory Visit (HOSPITAL_COMMUNITY)
Admission: EM | Admit: 2023-12-03 | Discharge: 2023-12-03 | Disposition: A | Discharge status: Home / Self Care | Payer: MEDICAID

## 2023-12-03 ENCOUNTER — Other Ambulatory Visit: Payer: Self-pay

## 2023-12-03 DIAGNOSIS — J4521 Mild intermittent asthma with (acute) exacerbation: Secondary | ICD-10-CM | POA: Diagnosis not present

## 2023-12-03 MED ORDER — ALBUTEROL SULFATE (2.5 MG/3ML) 0.083% IN NEBU: 2.5000 mg | INHALATION_SOLUTION | Freq: Four times a day (QID) | RESPIRATORY_TRACT | 12 refills | Status: AC | PRN

## 2023-12-03 MED ORDER — ALBUTEROL SULFATE HFA 108 (90 BASE) MCG/ACT IN AERS: 1.0000 | INHALATION_SPRAY | Freq: Four times a day (QID) | RESPIRATORY_TRACT | 2 refills | Status: AC | PRN

## 2023-12-03 NOTE — ED Provider Notes (Signed)
 [facility]  Note:  This document was prepared using Conservation officer, historic buildings and may include unintentional dictation errors.  MRN: [id number] DOB: 05/26/1970  Subjective:   Meghan Barrett is a 53 y.o. female presenting for asthma attack that occurred yesterday.  Patient reports that she is out of albuterol  inhaler and albuterol  solution for neb treatment.  Patient reports that she missed work due to her illness and needs a note to return to work tomorrow.  Patient denies any wheezing, cough, chest congestion, body aches, fatigue, fever today.  Patient reports that she had very severe asthma exacerbation yesterday.  No current facility-administered medications for this encounter.  Current Outpatient Medications:    albuterol  (PROVENTIL ) (2.5 MG/3ML) 0.083% nebulizer solution, Take 3 mLs (2.5 mg total) by nebulization every 6 (six) hours as needed for wheezing or shortness of breath., Disp: 75 mL, Rfl: 12   ARIPiprazole (ABILIFY) 10 MG tablet, Take by mouth., Disp: , Rfl:    cetirizine  (ZYRTEC ) 10 MG tablet, Take one pill daily, Disp: 90 tablet, Rfl: 2   diclofenac  Sodium (VOLTAREN ) 1 % GEL, Apply 2 g topically 4 (four) times daily. Rub into affected area of foot 2 to 4 times daily, Disp: 100 g, Rfl: 2   famotidine  (PEPCID ) 10 MG tablet, Take 1 tablet (10 mg total) by mouth 2 (two) times daily., Disp: 90 tablet, Rfl: 1   FLUoxetine (PROZAC) 20 MG capsule, Take 20 mg by mouth daily., Disp: , Rfl:    fluticasone  (FLONASE ) 50 MCG/ACT nasal spray, Place 1 spray into both nostrils daily., Disp: 16 g, Rfl: 1   naproxen  (NAPROSYN ) 500 MG tablet, Take 1 tablet (500 mg total) by mouth 2 (two) times daily., Disp: 30 tablet, Rfl: 0   polyethylene glycol powder (GLYCOLAX /MIRALAX ) 17 GM/SCOOP powder, Take 17 g by mouth daily., Disp: 225 g, Rfl: 1   rosuvastatin  (CRESTOR ) 20 MG tablet, Take 1 tablet (20 mg total) by mouth daily., Disp: 90 tablet, Rfl: 3   traZODone (DESYREL) 100 MG  tablet, Take 100 mg by mouth at bedtime., Disp: , Rfl:    traZODone (DESYREL) 50 MG tablet, Take by mouth., Disp: , Rfl:    albuterol  (VENTOLIN  HFA) 108 (90 Base) MCG/ACT inhaler, Inhale 1-2 puffs into the lungs every 6 (six) hours as needed for wheezing or shortness of breath., Disp: 8 g, Rfl: 2   diclofenac  (FLECTOR ) 1.3 % PTCH, Place 1 patch onto the skin 2 (two) times daily., Disp: 5 patch, Rfl: 1   methocarbamol  (ROBAXIN ) 500 MG tablet, Take 1 tablet (500 mg total) by mouth 2 (two) times daily as needed for muscle spasms., Disp: 20 tablet, Rfl: 0   metroNIDAZOLE  (FLAGYL ) 500 MG tablet, Take 1 tablet (500 mg total) by mouth 2 (two) times daily. For 7 days. Do not drink alcohol while taking this medication., Disp: 14 tablet, Rfl: 0   montelukast  (SINGULAIR ) 10 MG tablet, TAKE 1 TABLET(10 MG) BY MOUTH AT BEDTIME, Disp: 90 tablet, Rfl: 2   Allergies  Allergen Reactions   Penicillins Anaphylaxis   Other Hives and Other (See Comments)    Anything with acid including soda  Other: Blisters    Tomato Hives and Other (See Comments)    Blisters    Past Medical History:  Diagnosis Date   Anxiety    Asthma    Depression    Diabetes mellitus (HCC) dx'd 06/19/2013   Hernia    High cholesterol    Hx of diabetes mellitus 01/27/2020   Schizophrenia (  HCC)      Past Surgical History:  Procedure Laterality Date   APPENDECTOMY  08-06-15   CESAREAN SECTION  1991; 1999; 2002   FINGER FRACTURE SURGERY Left 2010   TUBAL LIGATION  2002    Family History  Problem Relation Age of Onset   Alcohol abuse Mother    Drug abuse Mother    Diabetes Mother    Alcohol abuse Father    Drug abuse Father    Heart attack Father    Diabetes Father    Diabetes Sister    Early death Sister    High Cholesterol Sister    Breast cancer Maternal Grandmother    Cancer Other    Stroke Other     Social History   Tobacco Use   Smoking status: Former    Types: Cigarettes   Smokeless tobacco: Never    Tobacco comments:    06/19/2013 smoke ~ 5 cigarettes/month  Vaping Use   Vaping status: Never Used  Substance Use Topics   Alcohol use: Not Currently   Drug use: Not Currently    Types: Crack cocaine    ROS Refer to HPI for ROS details.  Objective:    Vitals: BP 116/71   Pulse 93   Temp 97.9 F (36.6 C)   Resp 18   LMP 11/02/2023 (Approximate)   SpO2 93%   Physical Exam Vitals and nursing note reviewed.  Constitutional:      General: She is not in acute distress.    Appearance: Normal appearance. She is well-developed. She is not ill-appearing or toxic-appearing.  HENT:     Head: Normocephalic and atraumatic.     Mouth/Throat:     Mouth: Mucous membranes are moist.  Cardiovascular:     Rate and Rhythm: Normal rate and regular rhythm.     Heart sounds: Normal heart sounds. No murmur heard. Pulmonary:     Effort: Pulmonary effort is normal. No respiratory distress.     Breath sounds: Normal breath sounds. No stridor. No wheezing, rhonchi or rales.  Chest:     Chest wall: No tenderness.  Skin:    General: Skin is warm and dry.  Neurological:     General: No focal deficit present.     Mental Status: She is alert and oriented to person, place, and time.  Psychiatric:        Mood and Affect: Mood normal.        Behavior: Behavior normal.     Procedures  No results found for this or any previous visit (from the past 24 hours).  Assessment and Plan :     Discharge Instructions       1. Mild intermittent asthma with exacerbation (Primary) - albuterol  (VENTOLIN  HFA) 108 (90 Base) MCG/ACT inhaler; Inhale 1-2 puffs into the lungs every 6 (six) hours as needed for wheezing or shortness of breath.  Dispense: 8 g; Refill: 2 - albuterol  (PROVENTIL ) (2.5 MG/3ML) 0.083% nebulizer solution; Take 3 mLs (2.5 mg total) by nebulization every 6 (six) hours as needed for wheezing or shortness of breath.  Dispense: 75 mL; Refill: 12 -Continue to monitor symptoms for any  change in severity if there is any escalation of current symptoms or development of new symptoms follow-up in ER for further evaluation and management.      [place] Mullett B Parys Elenbaas   Dimetrius Montfort, [place] B, [place] 12/03/23 1123

## 2023-12-03 NOTE — Discharge Instructions (Signed)
  1. Mild intermittent asthma with exacerbation (Primary) - albuterol  (VENTOLIN  HFA) 108 (90 Base) MCG/ACT inhaler; Inhale 1-2 puffs into the lungs every 6 (six) hours as needed for wheezing or shortness of breath.  Dispense: 8 g; Refill: 2 - albuterol  (PROVENTIL ) (2.5 MG/3ML) 0.083% nebulizer solution; Take 3 mLs (2.5 mg total) by nebulization every 6 (six) hours as needed for wheezing or shortness of breath.  Dispense: 75 mL; Refill: 12 -Continue to monitor symptoms for any change in severity if there is any escalation of current symptoms or development of new symptoms follow-up in ER for further evaluation and management.

## 2023-12-03 NOTE — ED Triage Notes (Signed)
 PT reports she had a asthma attack yesterday and needs a refill on Albuterol  and Pt needs refill for neb machine. Pt also needs a work note.

## 2023-12-06 ENCOUNTER — Ambulatory Visit (INDEPENDENT_AMBULATORY_CARE_PROVIDER_SITE_OTHER): Payer: MEDICAID | Admitting: Family Medicine

## 2023-12-06 ENCOUNTER — Ambulatory Visit: Payer: MEDICAID | Admitting: Family Medicine

## 2023-12-06 ENCOUNTER — Other Ambulatory Visit (HOSPITAL_COMMUNITY)
Admission: RE | Admit: 2023-12-06 | Discharge: 2023-12-06 | Disposition: A | Discharge status: Home / Self Care | Payer: MEDICAID | Source: Ambulatory Visit | Attending: Family Medicine | Admitting: Family Medicine

## 2023-12-06 VITALS — BP 86/62 | HR 73 | Wt 133.8 lb

## 2023-12-06 DIAGNOSIS — M5489 Other dorsalgia: Secondary | ICD-10-CM | POA: Diagnosis not present

## 2023-12-06 DIAGNOSIS — N898 Other specified noninflammatory disorders of vagina: Secondary | ICD-10-CM | POA: Diagnosis present

## 2023-12-06 DIAGNOSIS — G8929 Other chronic pain: Secondary | ICD-10-CM

## 2023-12-06 LAB — POCT WET PREP (WET MOUNT)
Clue Cells Wet Prep Whiff POC: POSITIVE
Trichomonas Wet Prep HPF POC: ABSENT
WBC, Wet Prep HPF POC: NONE SEEN

## 2023-12-06 MED ORDER — FLUTICASONE PROPIONATE 50 MCG/ACT NA SUSP: 1.0000 | Freq: Every day | NASAL | 3 refills | Status: AC

## 2023-12-06 MED ORDER — METRONIDAZOLE 500 MG PO TABS: 500.0000 mg | ORAL_TABLET | Freq: Two times a day (BID) | ORAL | 0 refills | Status: AC

## 2023-12-06 NOTE — Patient Instructions (Addendum)
 Thank you for visiting clinic today and allowing us  to participate in your care!  We referred you to physical therapy and the pain clinic for your back pain. Someone will contact you over the next few weeks to schedule an appointment. In the meantime please continue taking the medications you have and using patches as needed.   We collected swab samples today and will let you know of the results when they return.   Please return on Friday for your labwork:  Future Appointments  Date Time Provider Department Center  12/08/2023  3:30 PM [facility]    Reach out any time with any questions or concerns you may have - we are here for you!  Damien Cassis, MD [facility] [phone]
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
## 2023-12-08 ENCOUNTER — Other Ambulatory Visit: Payer: Self-pay

## 2023-12-08 ENCOUNTER — Ambulatory Visit: Payer: Self-pay | Admitting: Family Medicine

## 2023-12-08 LAB — CERVICOVAGINAL ANCILLARY ONLY
Bacterial Vaginitis (gardnerella): POSITIVE — AB
Chlamydia: NEGATIVE
Comment: NEGATIVE
Comment: NEGATIVE
Comment: NEGATIVE
Comment: NORMAL
Neisseria Gonorrhea: NEGATIVE
Trichomonas: NEGATIVE

## 2023-12-18 ENCOUNTER — Ambulatory Visit (INDEPENDENT_AMBULATORY_CARE_PROVIDER_SITE_OTHER): Payer: MEDICAID | Admitting: Student

## 2023-12-18 ENCOUNTER — Other Ambulatory Visit: Payer: MEDICAID

## 2023-12-18 VITALS — BP 122/73 | Wt 134.8 lb

## 2023-12-18 DIAGNOSIS — N898 Other specified noninflammatory disorders of vagina: Secondary | ICD-10-CM

## 2023-12-18 DIAGNOSIS — M1990 Unspecified osteoarthritis, unspecified site: Secondary | ICD-10-CM | POA: Diagnosis not present

## 2023-12-18 MED ORDER — MELOXICAM 7.5 MG PO TABS: 7.5000 mg | ORAL_TABLET | Freq: Every day | ORAL | 0 refills | Status: AC

## 2023-12-18 NOTE — Assessment & Plan Note (Addendum)
 Hand and knee stiffness more consistent with arthritis.  Encouraged to continue use of Voltaren  gel.  Given patient is taking her naproxen  and will send in prescription for meloxicam to help with inflammation and pain.  Recommend as needed Tylenol  before given the nature of her job.  Can consider steroid injection if pain persist or unbearable.  Work note is provided to patient at end of visit.
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
## 2023-12-18 NOTE — Patient Instructions (Addendum)
 Pleasure to see you today.  Suspect you knee and hand pain are related to your arthritis.  I have sent in prescription for meloxicam which you will take once daily for the next 14 days.  If it improves your symptoms you can complete the 30 days.  I also recommend taking Tylenol  30 minutes before the start of your shift to help with pain control.  Please do not take your naproxen  while you are taking the meloxicam.  You can restart the naproxen  after you have completed the meloxicam dose.  You are currently due for your colonoscopy and shingles vaccine.  Please make sure to have the shingles vaccine completed at your pharmacy.

## 2023-12-22 ENCOUNTER — Other Ambulatory Visit: Payer: Self-pay

## 2023-12-22 DIAGNOSIS — E785 Hyperlipidemia, unspecified: Secondary | ICD-10-CM

## 2023-12-24 ENCOUNTER — Encounter (HOSPITAL_COMMUNITY): Payer: Self-pay | Admitting: Emergency Medicine

## 2023-12-24 ENCOUNTER — Other Ambulatory Visit: Payer: Self-pay

## 2023-12-24 ENCOUNTER — Ambulatory Visit (HOSPITAL_COMMUNITY)
Admission: EM | Admit: 2023-12-24 | Discharge: 2023-12-24 | Disposition: A | Discharge status: Home / Self Care | Payer: MEDICAID | Attending: Physician Assistant | Admitting: Physician Assistant

## 2023-12-24 DIAGNOSIS — R112 Nausea with vomiting, unspecified: Secondary | ICD-10-CM | POA: Diagnosis present

## 2023-12-24 DIAGNOSIS — K529 Noninfective gastroenteritis and colitis, unspecified: Secondary | ICD-10-CM | POA: Diagnosis present

## 2023-12-24 DIAGNOSIS — R197 Diarrhea, unspecified: Secondary | ICD-10-CM | POA: Diagnosis present

## 2023-12-24 LAB — CBC WITH DIFFERENTIAL/PLATELET
Abs Immature Granulocytes: 0.01 K/uL (ref 0.00–0.07)
Basophils Absolute: 0.1 K/uL (ref 0.0–0.1)
Basophils Relative: 1 %
Eosinophils Absolute: 0.1 K/uL (ref 0.0–0.5)
Eosinophils Relative: 2 %
HCT: 37.4 % (ref 36.0–46.0)
Hemoglobin: 12.1 g/dL (ref 12.0–15.0)
Immature Granulocytes: 0 %
Lymphocytes Relative: 45 %
Lymphs Abs: 2.9 K/uL (ref 0.7–4.0)
MCH: 30.3 pg (ref 26.0–34.0)
MCHC: 32.4 g/dL (ref 30.0–36.0)
MCV: 93.5 fL (ref 80.0–100.0)
Monocytes Absolute: 0.8 K/uL (ref 0.1–1.0)
Monocytes Relative: 12 %
Neutro Abs: 2.6 K/uL (ref 1.7–7.7)
Neutrophils Relative %: 40 %
Platelets: 273 K/uL (ref 150–400)
RBC: 4 MIL/uL (ref 3.87–5.11)
RDW: 12 % (ref 11.5–15.5)
WBC: 6.5 K/uL (ref 4.0–10.5)
nRBC: 0 % (ref 0.0–0.2)

## 2023-12-24 LAB — POCT URINALYSIS DIP (MANUAL ENTRY)
Bilirubin, UA: NEGATIVE
Glucose, UA: NEGATIVE mg/dL
Leukocytes, UA: NEGATIVE
Nitrite, UA: NEGATIVE
Protein Ur, POC: NEGATIVE mg/dL
Spec Grav, UA: 1.025 (ref 1.010–1.025)
Urobilinogen, UA: 0.2 U/dL
pH, UA: 5.5 (ref 5.0–8.0)

## 2023-12-24 LAB — COMPREHENSIVE METABOLIC PANEL WITH GFR
ALT: 19 U/L (ref 0–44)
AST: 39 U/L (ref 15–41)
Albumin: 4.5 g/dL (ref 3.5–5.0)
Alkaline Phosphatase: 55 U/L (ref 38–126)
Anion gap: 14 (ref 5–15)
BUN: 20 mg/dL (ref 6–20)
CO2: 25 mmol/L (ref 22–32)
Calcium: 9.5 mg/dL (ref 8.9–10.3)
Chloride: 98 mmol/L (ref 98–111)
Creatinine, Ser: 1.05 mg/dL — ABNORMAL HIGH (ref 0.44–1.00)
GFR, Estimated: >60 mL/min (ref >60)
Glucose, Bld: 66 mg/dL — ABNORMAL LOW (ref 70–99)
Potassium: 4.2 mmol/L (ref 3.5–5.1)
Sodium: 137 mmol/L (ref 135–145)
Total Bilirubin: 1 mg/dL (ref 0.0–1.2)
Total Protein: 7.9 g/dL (ref 6.5–8.1)

## 2023-12-24 LAB — POCT FASTING CBG KUC MANUAL ENTRY: POCT Glucose (KUC): 77 mg/dL (ref 70–99)

## 2023-12-24 LAB — LIPASE, BLOOD: Lipase: 17 U/L (ref 11–51)

## 2023-12-24 LAB — POCT URINE PREGNANCY: Preg Test, Ur: NEGATIVE

## 2023-12-24 MED ORDER — SUCRALFATE 1 G PO TABS
1.0000 g | ORAL_TABLET | Freq: Three times a day (TID) | ORAL | 0 refills | Status: AC
Start: 2023-12-24 — End: ?

## 2023-12-24 MED ORDER — ALUM & MAG HYDROXIDE-SIMETH 200-200-20 MG/5ML PO SUSP
30.0000 mL | Freq: Once | ORAL | Status: AC
  Administered 2023-12-24: 30 mL via ORAL

## 2023-12-24 MED ORDER — ALUM & MAG HYDROXIDE-SIMETH 200-200-20 MG/5ML PO SUSP
ORAL | Status: AC
  Filled 2023-12-24: qty 30

## 2023-12-24 MED ORDER — ROSUVASTATIN CALCIUM 20 MG PO TABS: 20.0000 mg | ORAL_TABLET | Freq: Every day | ORAL | 3 refills | Status: AC

## 2023-12-24 MED ORDER — FAMOTIDINE 20 MG PO TABS
20.0000 mg | ORAL_TABLET | Freq: Every day | ORAL | 0 refills | Status: AC
Start: 2023-12-24 — End: ?

## 2023-12-24 NOTE — ED Triage Notes (Signed)
 Onset Friday of vomiting, diarrhea.  Reports 4 episodes of diarrhea today, 1 episode of vomiting today.  Patient is sipping water.  Patient has intermittent pain on right lower back.,  patient complains of a cough, and coughing makes patient feel like vomiting

## 2023-12-24 NOTE — Discharge Instructions (Signed)
 I am glad that you are feeling better after the medication.  Eat a bland diet and avoid spicy/acidic/fatty foods.  Drink plenty of fluid.  Start famotidine  daily to help with acid in your stomach which could be contributing to your symptoms.  I would also like you to start sucralfate before each meal for the next week.  If your symptoms are not improving within a few days or if anything worsens and you have severe abdominal pain, abdominal pain in 1 part of your abdomen, nausea/vomiting interfere with oral intake, blood in your vomit, blood in your stool, weakness you need to go to the emergency room immediately.  Follow-up with your primary care next week.

## 2023-12-24 NOTE — ED Provider Notes (Signed)
 [facility]    CSN: [id number] Arrival date & time: 12/24/23  1314      History   Chief Complaint No chief complaint on file.   HPI Meghan Barrett is a 53 y.o. female.   Patient presents today with a 2-day history of nausea, vomiting, diarrhea.  She reports that her symptoms were initially very intense but have improved over the past 24 hours.  She has had 4 watery bowel movements since waking up today and 1 episode of emesis.  Denies any associated melena, hematochezia, hematemesis, fever, urinary symptoms, vaginal symptoms.  She has not tried any over-the-counter medication for symptom management.  Denies any suspicious food intake, recent medication changes, antibiotic use, known sick contacts, recent travel.  She reports that abdominal discomfort is rated 6 on a 0-10 pain scale, generalized throughout her abdomen, no alleviating factors notified.  She reports previous appendectomy and has had a C-section as well as tubal ligation but denies additional abdominal surgery.  She denies history of gastrointestinal disorder including ulcerative colitis, Crohn's disease, diverticulitis.    Past Medical History:  Diagnosis Date   Anxiety    Asthma    Depression    Diabetes mellitus (HCC) dx'd 06/19/2013   Hernia    High cholesterol    Hx of diabetes mellitus 01/27/2020   Schizophrenia [facility])     Patient Active Problem List   Diagnosis Date Noted   History of trichomoniasis 12/22/2022   Osteoarthritis 08/05/2022   Prediabetes 06/28/2022   Hot flushes, perimenopausal 12/16/2021   Healthcare maintenance 09/11/2021   Hx of diabetes mellitus 01/27/2020   Gastroesophageal reflux disease 01/27/2020   Asthma in adult, moderate persistent, uncomplicated 12/13/2019   Hyperlipidemia 08/17/2015   Schizophrenia (HCC) 06/19/2013    Past Surgical History:  Procedure Laterality Date   APPENDECTOMY  08-06-15   CESAREAN SECTION  1991; 1999; 2002   FINGER FRACTURE SURGERY Left 2010    TUBAL LIGATION  2002    OB History   No obstetric history on file.      Home Medications    Prior to Admission medications   Medication Sig Start Date End Date Taking? Authorizing Provider  famotidine  (PEPCID ) 20 MG tablet Take 1 tablet (20 mg total) by mouth at bedtime. 12/24/23  Yes Alyric Parkin K, PA-C  sucralfate (CARAFATE) 1 g tablet Take 1 tablet (1 g total) by mouth 3 (three) times daily before meals. 12/24/23  Yes Zlata Alcaide K, PA-C  albuterol  (PROVENTIL ) (2.5 MG/3ML) 0.083% nebulizer solution Take 3 mLs (2.5 mg total) by nebulization every 6 (six) hours as needed for wheezing or shortness of breath. 12/03/23   Reddick, Johnathan B, NP  albuterol  (VENTOLIN  HFA) 108 (90 Base) MCG/ACT inhaler Inhale 1-2 puffs into the lungs every 6 (six) hours as needed for wheezing or shortness of breath. 12/03/23   Reddick, Johnathan B, NP  ARIPiprazole (ABILIFY) 10 MG tablet Take by mouth. 03/06/20   [provider]  cetirizine  (ZYRTEC ) 10 MG tablet Take one pill daily 03/23/23   Sowell, Brandon, MD  diclofenac  Sodium (VOLTAREN ) 1 % GEL Apply 2 g topically 4 (four) times daily. Rub into affected area of foot 2 to 4 times daily 12/20/22   Marlee Lynwood NOVAK, MD  FLUoxetine (PROZAC) 20 MG capsule Take 20 mg by mouth daily. 12/02/19   [provider]  fluticasone  (FLONASE ) 50 MCG/ACT nasal spray Place 1 spray into both nostrils daily. 12/06/23   Diona Perkins, MD  meloxicam (MOBIC) 7.5 MG tablet  Take 1 tablet (7.5 mg total) by mouth daily. 12/18/23   Rosendo Rush, MD  methocarbamol  (ROBAXIN ) 500 MG tablet Take 1 tablet (500 mg total) by mouth 2 (two) times daily as needed for muscle spasms. 07/15/22   Austin Ade, MD  metroNIDAZOLE  (FLAGYL ) 500 MG tablet Take 1 tablet (500 mg total) by mouth 2 (two) times daily. 12/06/23   Diona Perkins, MD  montelukast  (SINGULAIR ) 10 MG tablet TAKE 1 TABLET(10 MG) BY MOUTH AT BEDTIME 12/20/22   Marlee Lynwood NOVAK, MD  polyethylene glycol powder (GLYCOLAX /MIRALAX ) 17  GM/SCOOP powder Take 17 g by mouth daily. 01/11/21   Autry-Lott, Rojean, DO  rosuvastatin  (CRESTOR ) 20 MG tablet Take 1 tablet (20 mg total) by mouth daily. 12/23/22   Marlee Lynwood NOVAK, MD  traZODone (DESYREL) 100 MG tablet Take 100 mg by mouth at bedtime. 11/23/22   [provider]  traZODone (DESYREL) 50 MG tablet Take by mouth. 03/06/20   [provider]    Family History Family History  Problem Relation Age of Onset   Alcohol abuse Mother    Drug abuse Mother    Diabetes Mother    Alcohol abuse Father    Drug abuse Father    Heart attack Father    Diabetes Father    Diabetes Sister    Early death Sister    High Cholesterol Sister    Breast cancer Maternal Grandmother    Cancer Other    Stroke Other     Social History Social History   Tobacco Use   Smoking status: Former    Types: Cigarettes   Smokeless tobacco: Never   Tobacco comments:    06/19/2013 smoke ~ 5 cigarettes/month  Vaping Use   Vaping status: Never Used  Substance Use Topics   Alcohol use: Not Currently   Drug use: Not Currently    Types: Crack cocaine     Allergies   Penicillins, Other, and Tomato   Review of Systems Review of Systems  Constitutional:  Positive for activity change. Negative for appetite change, fatigue and fever.  HENT:  Negative for congestion and sore throat.   Respiratory:  Negative for shortness of breath.   Cardiovascular:  Negative for chest pain.  Gastrointestinal:  Positive for abdominal pain, diarrhea, nausea and vomiting. Negative for blood in stool, constipation and rectal pain.  Genitourinary:  Negative for dysuria, frequency, pelvic pain, urgency, vaginal bleeding, vaginal discharge and vaginal pain.     Physical Exam Triage Vital Signs ED Triage Vitals  Encounter Vitals Group     BP 12/24/23 1514 110/65     Girls Systolic BP Percentile --      Girls Diastolic BP Percentile --      Boys Systolic BP Percentile --      Boys Diastolic BP  Percentile --      Pulse Rate 12/24/23 1514 85     Resp 12/24/23 1514 18     Temp 12/24/23 1514 98.8 F (37.1 C)     Temp Source 12/24/23 1514 Oral     SpO2 12/24/23 1514 96 %     Weight --      Height --      Head Circumference --      Peak Flow --      Pain Score 12/24/23 1510 7     Pain Loc --      Pain Education --      Exclude from Growth Chart --    No data found.  Updated Vital Signs BP 110/65 (BP Location: Right Arm)   Pulse 85   Temp 98.8 F (37.1 C) (Oral)   Resp 18   LMP 11/02/2023 (Approximate)   SpO2 96%   Visual Acuity Right Eye Distance:   Left Eye Distance:   Bilateral Distance:    Right Eye Near:   Left Eye Near:    Bilateral Near:     Physical Exam Vitals reviewed.  Constitutional:      General: She is awake. She is not in acute distress.    Appearance: Normal appearance. She is well-developed. She is not ill-appearing.     Comments: Very pleasant female appears stated age in no acute distress sitting comfortably in exam room  HENT:     Head: Normocephalic and atraumatic.     Mouth/Throat:     Pharynx: Uvula midline. No oropharyngeal exudate or posterior oropharyngeal erythema.  Cardiovascular:     Rate and Rhythm: Normal rate and regular rhythm.     Heart sounds: Normal heart sounds, S1 normal and S2 normal. No murmur heard. Pulmonary:     Effort: Pulmonary effort is normal.     Breath sounds: Normal breath sounds. No wheezing, rhonchi or rales.     Comments: Clear to auscultation bilaterally Abdominal:     General: Bowel sounds are normal.     Palpations: Abdomen is soft.     Tenderness: There is generalized abdominal tenderness. There is no right CVA tenderness, left CVA tenderness, guarding or rebound.     Comments: Mild tenderness palpation throughout abdomen.  No evidence of acute abdomen on physical exam.  Psychiatric:        Behavior: Behavior is cooperative.      UC Treatments / Results  Labs (all labs ordered are listed,  but only abnormal results are displayed) Labs Reviewed  POCT URINALYSIS DIP (MANUAL ENTRY) - Abnormal; Notable for the following components:      Result Value   Ketones, POC UA trace (5) (*)    Blood, UA trace-lysed (*)    All other components within normal limits  POCT URINE PREGNANCY - Normal  CBC WITH DIFFERENTIAL/PLATELET  COMPREHENSIVE METABOLIC PANEL WITH GFR  LIPASE, BLOOD  POCT FASTING CBG KUC MANUAL ENTRY    EKG   Radiology No results found.  Procedures Procedures (including critical care time)  Medications Ordered in UC Medications  alum & mag hydroxide-simeth (MAALOX/MYLANTA) 200-200-20 MG/5ML suspension 30 mL (30 mLs Oral Given 12/24/23 1543)    Initial Impression / Assessment and Plan / UC Course  I have reviewed the triage vital signs and the nursing notes.  Pertinent labs & imaging results that were available during my care of the patient were reviewed by me and considered in my medical decision making (see chart for details).     Patient is well-appearing, afebrile, nontoxic, nontachycardic.  Vital signs and physical exam are reassuring with no indication for emergent evaluation or imaging.  Patient was given Maalox in clinic with improvement of symptoms.  I suspect that she has gastroenteritis combined with GERD contributing to her symptoms.  She has had some improvement of symptoms and so we will treat conservatively and she was given Pepcid  as well as sucralfate to help manage her symptoms.  Recommended dietary lifestyle modification for additional symptom relief.  Notification for dose adjustment of medication based on metabolic panel from 05/18/2023 with creatinine of 1.07 calculated creatinine clearance of 60 mL/min.  Her UA did not show any evidence of dehydration.  Random blood sugar was normal.  Basic blood work including CBC, CMP, lipase was obtained and is pending.  We will contact her if this is abnormal and changes her treatment plan.  We discussed that  if her symptoms do not continue to improve or if anything worsens and she has focal abdominal pain, nausea/vomiting interfering with oral intake, melena, hematochezia, hematemesis she needs to be seen immediately.  Strict return precautions given.  Excuse note provided.  Final Clinical Impressions(s) / UC Diagnoses   Final diagnoses:  Nausea vomiting and diarrhea  Gastroenteritis     Discharge Instructions      I am glad that you are feeling better after the medication.  Eat a bland diet and avoid spicy/acidic/fatty foods.  Drink plenty of fluid.  Start famotidine  daily to help with acid in your stomach which could be contributing to your symptoms.  I would also like you to start sucralfate before each meal for the next week.  If your symptoms are not improving within a few days or if anything worsens and you have severe abdominal pain, abdominal pain in 1 part of your abdomen, nausea/vomiting interfere with oral intake, blood in your vomit, blood in your stool, weakness you need to go to the emergency room immediately.  Follow-up with your primary care next week.     ED Prescriptions     Medication Sig Dispense Auth. Provider   famotidine  (PEPCID ) 20 MG tablet Take 1 tablet (20 mg total) by mouth at bedtime. 30 tablet Barnell Shieh K, PA-C   sucralfate (CARAFATE) 1 g tablet Take 1 tablet (1 g total) by mouth 3 (three) times daily before meals. 21 tablet Luvenia Cranford K, PA-C      PDMP not reviewed this encounter.   Sherrell Rocky POUR, PA-C 12/24/23 1648

## 2023-12-25 ENCOUNTER — Ambulatory Visit (HOSPITAL_COMMUNITY): Payer: Self-pay

## 2024-01-15 ENCOUNTER — Telehealth: Payer: Self-pay

## 2024-01-15 NOTE — Telephone Encounter (Signed)
 Patient calls nurse line requesting a refill on Valtrex .   She reports she has current active outbreak.  Will forward to PCP to send in.

## 2024-01-17 ENCOUNTER — Encounter (HOSPITAL_COMMUNITY): Payer: Self-pay

## 2024-01-17 ENCOUNTER — Ambulatory Visit (HOSPITAL_COMMUNITY)
Admission: EM | Admit: 2024-01-17 | Discharge: 2024-01-17 | Disposition: A | Discharge status: Home / Self Care | Payer: MEDICAID | Attending: Nurse Practitioner | Admitting: Nurse Practitioner

## 2024-01-17 DIAGNOSIS — B009 Herpesviral infection, unspecified: Secondary | ICD-10-CM

## 2024-01-17 MED ORDER — VALACYCLOVIR HCL 500 MG PO TABS: 500.0000 mg | ORAL_TABLET | Freq: Two times a day (BID) | ORAL | 0 refills | Status: AC

## 2024-01-17 NOTE — Discharge Instructions (Signed)
 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 Your exam showed small fluid-filled blisters consistent with a recurrent herpes flare. You were prescribed valacyclovir  to help shorten the duration of the outbreak, reduce discomfort, and decrease the chance of spreading the virus. Take this medication exactly as prescribed and finish the full course even if symptoms start to improve.  To help relieve symptoms, keep the affected area clean and dry, and avoid touching or scratching the lesions. You may gently wash the area with mild soap and water and pat dry with a clean towel. Wear loose-fitting clothing and breathable fabrics to minimize irritation. Avoid using creams, ointments, or home remedies that were not prescribed, as these may worsen irritation. Always wash your hands thoroughly after touching the area to prevent spreading the virus to other parts of your body or to others.  Do not have any sexual contact until all sores have completely healed and the skin looks normal again. Even when no sores are visible, the virus can still occasionally spread, so using condoms consistently and discussing your diagnosis with partners is important for prevention.  Go to the emergency department right away if you develop severe pain, spreading rash, inability to urinate, or new neurological symptoms such as weakness or numbness.

## 2024-01-17 NOTE — ED Triage Notes (Signed)
 Patient presents with rash on her buttock x 3 days. Report pain and itching. OTC-hydrocortisone cream has been used.

## 2024-01-17 NOTE — ED Provider Notes (Signed)
 [facility]    CSN: [id number] Arrival date & time: 01/17/24  1533      History   Chief Complaint Chief Complaint  Patient presents with   Rash    HPI Meghan Barrett is a 53 y.o. female.   Discussed the use of AI scribe software for clinical note transcription with the patient, who gave verbal consent to proceed.   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 The current episode began approximately three days ago as a small, pimple-like lesion near the gluteal cleft and has progressively worsened. The patient now reports significant itching and burning sensations.   The patient has a known history of HSV, first diagnosed in May of last year after developing similar symptoms in the same location. At that time, a lesion swab confirmed HSV, and treatment with oral antiviral successfully resolved the outbreak. Since the initial diagnosis, the patient has not had any additional outbreaks until now.   When this outbreak began, the patient contacted their primary care provider but was told it could take up to 72 hours for a response. Their primary care office was closed today, and although they have an appointment scheduled for tomorrow, worsening symptoms prompted today's urgent care visit for treatment. The patient has been using over-the-counter hydrocortisone cream for itching with minimal relief.  The following sections of the patient's history were reviewed and updated as appropriate: allergies, current medications, past family history, past medical history, past social history, past surgical history, and problem list.     Past Medical History:  Diagnosis Date   Anxiety    Asthma    Depression    Diabetes mellitus (HCC) dx'd 06/19/2013   Hernia    High cholesterol    Hx of diabetes mellitus 01/27/2020   Schizophrenia (HCC)     Patient Active Problem List   Diagnosis Date Noted    History of trichomoniasis 12/22/2022   Osteoarthritis 08/05/2022   Prediabetes 06/28/2022   Hot flushes, perimenopausal 12/16/2021   Healthcare maintenance 09/11/2021   Hx of diabetes mellitus 01/27/2020   Gastroesophageal reflux disease 01/27/2020   Asthma in adult, moderate persistent, uncomplicated 12/13/2019   Hyperlipidemia 08/17/2015   Schizophrenia (HCC) 06/19/2013    Past Surgical History:  Procedure Laterality Date   APPENDECTOMY  08-06-15   CESAREAN SECTION  1991; 1999; 2002   FINGER FRACTURE SURGERY Left 2010   TUBAL LIGATION  2002    OB History   No obstetric history on file.      Home Medications    Prior to Admission medications   Medication Sig Start Date End Date Taking? Authorizing Provider  valACYclovir  (VALTREX ) 500 MG tablet Take 1 tablet (500 mg total) by mouth 2 (two) times daily for 3 days. 01/17/24 01/20/24 Yes Iola Lukes, FNP  albuterol  (PROVENTIL ) (2.5 MG/3ML) 0.083% nebulizer solution Take 3 mLs (2.5 mg total) by nebulization every 6 (six) hours as needed for wheezing or shortness of breath. 12/03/23   Reddick, Johnathan B, NP  albuterol  (VENTOLIN  HFA) 108 (90 Base) MCG/ACT inhaler Inhale 1-2 puffs into the lungs every 6 (six) hours as needed for wheezing or shortness of breath. 12/03/23   Reddick, Johnathan B, NP  ARIPiprazole (ABILIFY) 10 MG tablet Take by mouth. 03/06/20   [provider]  cetirizine  (ZYRTEC ) 10 MG tablet Take one pill daily 03/23/23   Sowell, Brandon, MD  diclofenac  Sodium (VOLTAREN ) 1 % GEL Apply 2 g topically  4 (four) times daily. Rub into affected area of foot 2 to 4 times daily 12/20/22   Marlee Lynwood NOVAK, MD  famotidine  (PEPCID ) 20 MG tablet Take 1 tablet (20 mg total) by mouth at bedtime. 12/24/23   Raspet, Erin K, PA-C  FLUoxetine (PROZAC) 20 MG capsule Take 20 mg by mouth daily. 12/02/19   [provider]  fluticasone  (FLONASE ) 50 MCG/ACT nasal spray Place 1 spray into both nostrils daily. 12/06/23   Diona Perkins, MD  meloxicam (MOBIC) 7.5 MG tablet Take 1 tablet (7.5 mg total) by mouth daily. 12/18/23   Rosendo Rush, MD  methocarbamol  (ROBAXIN ) 500 MG tablet Take 1 tablet (500 mg total) by mouth 2 (two) times daily as needed for muscle spasms. 07/15/22   Austin Ade, MD  metroNIDAZOLE  (FLAGYL ) 500 MG tablet Take 1 tablet (500 mg total) by mouth 2 (two) times daily. 12/06/23   Diona Perkins, MD  montelukast  (SINGULAIR ) 10 MG tablet TAKE 1 TABLET(10 MG) BY MOUTH AT BEDTIME 12/20/22   Marlee Lynwood NOVAK, MD  polyethylene glycol powder (GLYCOLAX /MIRALAX ) 17 GM/SCOOP powder Take 17 g by mouth daily. 01/11/21   Autry-Lott, Rojean, DO  rosuvastatin  (CRESTOR ) 20 MG tablet Take 1 tablet (20 mg total) by mouth daily. 12/24/23   Bhagat, Virali, DO  sucralfate (CARAFATE) 1 g tablet Take 1 tablet (1 g total) by mouth 3 (three) times daily before meals. 12/24/23   Raspet, Erin K, PA-C  traZODone (DESYREL) 100 MG tablet Take 100 mg by mouth at bedtime. 11/23/22   [provider]  traZODone (DESYREL) 50 MG tablet Take by mouth. 03/06/20   [provider]    Family History Family History  Problem Relation Age of Onset   Alcohol abuse Mother    Drug abuse Mother    Diabetes Mother    Alcohol abuse Father    Drug abuse Father    Heart attack Father    Diabetes Father    Diabetes Sister    Early death Sister    High Cholesterol Sister    Breast cancer Maternal Grandmother    Cancer Other    Stroke Other     Social History Social History   Tobacco Use   Smoking status: Former    Types: Cigarettes   Smokeless tobacco: Never   Tobacco comments:    06/19/2013 smoke ~ 5 cigarettes/month  Vaping Use   Vaping status: Never Used  Substance Use Topics   Alcohol use: Not Currently   Drug use: Not Currently    Types: Crack cocaine     Allergies   Penicillins, Other, and Tomato   Review of Systems Review of Systems  Skin:  Positive for wound.  All other systems reviewed and are  negative.    Physical Exam Triage Vital Signs ED Triage Vitals [01/17/24 1653]  Encounter Vitals Group     BP (!) 93/58     Girls Systolic BP Percentile      Girls Diastolic BP Percentile      Boys Systolic BP Percentile      Boys Diastolic BP Percentile      Pulse Rate 69     Resp 18     Temp (!) 97.4 F (36.3 C)     Temp Source Oral     SpO2 99 %     Weight      Height      Head Circumference      Peak Flow      Pain Score  Pain Loc      Pain Education      Exclude from Growth Chart    No data found.  Updated Vital Signs BP (!) 93/58 (BP Location: Left Arm)   Pulse 69   Temp (!) 97.4 F (36.3 C) (Oral)   Resp 18   SpO2 99%   Visual Acuity Right Eye Distance:   Left Eye Distance:   Bilateral Distance:    Right Eye Near:   Left Eye Near:    Bilateral Near:     Physical Exam Vitals reviewed. Exam conducted with a chaperone present Beacher Molly, CMA).  Constitutional:      General: She is awake. She is not in acute distress.    Appearance: Normal appearance. She is well-developed. She is not ill-appearing, toxic-appearing or diaphoretic.  HENT:     Head: Normocephalic.     Right Ear: Hearing normal.     Left Ear: Hearing normal.     Nose: Nose normal.     Mouth/Throat:     Mouth: Mucous membranes are moist.  Eyes:     General: Vision grossly intact.     Conjunctiva/sclera: Conjunctivae normal.  Cardiovascular:     Rate and Rhythm: Normal rate and regular rhythm.     Heart sounds: Normal heart sounds.  Pulmonary:     Effort: Pulmonary effort is normal.     Breath sounds: Normal breath sounds and air entry.  Musculoskeletal:        General: Normal range of motion.     Cervical back: Normal range of motion and neck supple.  Skin:    General: Skin is warm and dry.     Findings: Rash present. Rash is vesicular.         Comments: Vesicular lesion noted on the right buttock, lateral to the gluteal cleft. Lesion appears intact without surrounding  erythema, swelling, warmth, or underlying induration. No drainage or signs of secondary infection observed.  Neurological:     General: No focal deficit present.     Mental Status: She is alert and oriented to person, place, and time.  Psychiatric:        Speech: Speech normal.        Behavior: Behavior is cooperative.      UC Treatments / Results  Labs (all labs ordered are listed, but only abnormal results are displayed) Labs Reviewed - No data to display  EKG   Radiology No results found.  Procedures Procedures (including critical care time)  Medications Ordered in UC Medications - No data to display  Initial Impression / Assessment and Plan / UC Course  I have reviewed the triage vital signs and the nursing notes.  Pertinent labs & imaging results that were available during my care of the patient were reviewed by me and considered in my medical decision making (see chart for details).    The patient presents with herpes simplex virus (HSV) outbreak. Physical examination reveals vesicular lesions consistent with HSV. Given the history of prior outbreaks and typical presentation, findings are consistent with a recurrent HSV flare. Valacyclovir  was prescribed for antiviral treatment to reduce symptom duration and viral shedding. Supportive care discussed, including keeping the area clean and dry, avoiding scratching, and washing hands thoroughly after touching the affected area. The patient was advised to abstain from sexual activity until lesions are completely healed and to follow up with their primary care provider for ongoing management and suppressive therapy discussion if outbreaks become more frequent.  Today's  evaluation has revealed no signs of a dangerous process. Discussed diagnosis with patient and/or guardian. Patient and/or guardian aware of their diagnosis, possible red flag symptoms to watch out for and need for close follow up. Patient and/or guardian understands  verbal and written discharge instructions. Patient and/or guardian comfortable with plan and disposition.  Patient and/or guardian has a clear mental status at this time, good insight into illness (after discussion and teaching) and has clear judgment to make decisions regarding their care  Documentation was completed with the aid of voice recognition software. Transcription may contain typographical errors.   Final Clinical Impressions(s) / UC Diagnoses   Final diagnoses:  HSV (herpes simplex virus) infection     Discharge Instructions      You were seen today for a herpes simplex virus (HSV) outbreak. Your exam showed small fluid-filled blisters consistent with a recurrent herpes flare. You were prescribed valacyclovir  to help shorten the duration of the outbreak, reduce discomfort, and decrease the chance of spreading the virus. Take this medication exactly as prescribed and finish the full course even if symptoms start to improve.  To help relieve symptoms, keep the affected area clean and dry, and avoid touching or scratching the lesions. You may gently wash the area with mild soap and water and pat dry with a clean towel. Wear loose-fitting clothing and breathable fabrics to minimize irritation. Avoid using creams, ointments, or home remedies that were not prescribed, as these may worsen irritation. Always wash your hands thoroughly after touching the area to prevent spreading the virus to other parts of your body or to others.  Do not have any sexual contact until all sores have completely healed and the skin looks normal again. Even when no sores are visible, the virus can still occasionally spread, so using condoms consistently and discussing your diagnosis with partners is important for prevention.  Go to the emergency department right away if you develop severe pain, spreading rash, inability to urinate, or new neurological symptoms such as weakness or numbness.     ED  Prescriptions     Medication Sig Dispense Auth. Provider   valACYclovir  (VALTREX ) 500 MG tablet Take 1 tablet (500 mg total) by mouth 2 (two) times daily for 3 days. 6 tablet Iola Lukes, FNP      PDMP not reviewed this encounter.   Iola Lukes, [place] 01/17/24 [phone]

## 2024-01-18 ENCOUNTER — Other Ambulatory Visit (HOSPITAL_COMMUNITY)
Admission: RE | Admit: 2024-01-18 | Discharge: 2024-01-18 | Disposition: A | Discharge status: Home / Self Care | Payer: MEDICAID | Source: Ambulatory Visit | Attending: Family Medicine | Admitting: Family Medicine

## 2024-01-18 ENCOUNTER — Ambulatory Visit (INDEPENDENT_AMBULATORY_CARE_PROVIDER_SITE_OTHER): Payer: MEDICAID

## 2024-01-18 VITALS — BP 92/58 | HR 74 | Wt 132.0 lb

## 2024-01-18 DIAGNOSIS — N898 Other specified noninflammatory disorders of vagina: Secondary | ICD-10-CM | POA: Diagnosis not present

## 2024-01-18 DIAGNOSIS — Z8639 Personal history of other endocrine, nutritional and metabolic disease: Secondary | ICD-10-CM | POA: Diagnosis not present

## 2024-01-18 DIAGNOSIS — B009 Herpesviral infection, unspecified: Secondary | ICD-10-CM | POA: Diagnosis not present

## 2024-01-18 LAB — POCT WET PREP (WET MOUNT)
Clue Cells Wet Prep Whiff POC: NEGATIVE
Trichomonas Wet Prep HPF POC: ABSENT

## 2024-01-18 LAB — POCT GLYCOSYLATED HEMOGLOBIN (HGB A1C): HbA1c, POC (prediabetic range): 5.8 % (ref 5.7–6.4)

## 2024-01-18 MED ORDER — VALACYCLOVIR HCL 500 MG PO TABS: 500.0000 mg | ORAL_TABLET | Freq: Two times a day (BID) | ORAL | 0 refills | Status: AC

## 2024-01-18 NOTE — Patient Instructions (Addendum)
 Thank you for visiting the clinic today, it was good to see you!  Our plans for today: - Your hemoglobin A1c is 5.8 which is great! - I sent a prescription for valtrex  to your pharmacy - I will update you via Mychart or phone once your results are back  We will discuss your arthritis at your next appointment on 02/01/24 at 4:00pm  Please arrive 15 minutes PRIOR to your next scheduled appointment time! If you do not, this affects OTHER patients' care.  For any questions, please call the office at [phone] or send me a message in MyChart.  It was a pleasure to take care of you today. Have a great day!  Ladarrious Kirksey, DO [facility] Resident, PGY-1  -------------------------------------------------------------------------------  Do you need your medications delivered to your home?   We'll send your prescription to the [facility] for delivery.          Address: [address] [place], [place], [place] [address]          Phone: [phone]  Please call the [facility] to speak with a pharmacist and set up your home medication delivery. If you have any questions, feel free to contact us  -- we're happy to help!  Other [place] [facility]acies that offer affordable prices on both prescriptions and over-the-counter items, as well as convenient services like vaccinations, are  [facility], at [facility]         Address:  [address], [place], [place] [address]         Phone: [phone]  [facility], located in the [facility]        Address: [address], [place], [place] [address]        Phone: [phone]  [facility], at [facility]       Address: [address], [place], [place] [address]       Phone: [phone]  [facility], at [facility]       Address: [address], [place], [place]  [address]       Phone: [phone]

## 2024-01-18 NOTE — Progress Notes (Signed)
    SUBJECTIVE:   CHIEF COMPLAINT / HPI:   Meghan Barrett is a 53 y.o.female who presents for HgbA1c check. Not currently taking medication for treatment of T2DM. Previously took medication in 2019/2020. Stopped once HgbA1c normalized.   Patient with current HSV outbreak. States she recently went to urgent care for valtrex  and is requesting a refill for future outbreaks. Has an outbreak every 6 mos to a year.  Also c/o of vaginal itching and white discharge x 2 days. Denies odor, dysuria or hematuria. No new sexual partners. Last sexual intercourse 3 wks ago.   PERTINENT  PMH / PSH: HSV, HLD, T2DM  OBJECTIVE:   BP (!) 92/58   Pulse 74   Wt 132 lb (59.9 kg)   SpO2 97%   BMI 21.97 kg/m    General: Alert, well-appearing female in NAD.  Cardiovascular: RRR, no m/r/g appreciated. Pulmonary: Normal WOB. CTAB with no w/c/r present.  Abdomen: Soft, non-tender, non-distended. Extremities: Moves all four extremities appropriately  Pelvic: Normal-appearing female external genitalia. Vagina is pink and rugated. Cervix with normal contour, no lesions. Normal discharge. Chaperoned exam, CMA Damien Hamilton.    ASSESSMENT/PLAN:   Assessment & Plan Hx of diabetes mellitus HgA1c 5.8 today which was discussed with patient. This is stable and will continue to watch. Vaginal itching and discharge - Wet prep negative which was discussed with patient - GC/chlamydia collected - Will treat if appropriate pending results HSV (herpes simplex virus) infection Reports current outbreak which she is currently taking valtrex  for. - Refilled Valtrex  500mg  to take BID in the event of outbreaks     Darren Jernigan, DO [facility]

## 2024-01-21 DIAGNOSIS — B009 Herpesviral infection, unspecified: Secondary | ICD-10-CM | POA: Insufficient documentation

## 2024-01-21 NOTE — Assessment & Plan Note (Signed)
 Reports current outbreak which she is currently taking valtrex  for. - Refilled Valtrex  500mg  to take BID in the event of outbreaks

## 2024-01-21 NOTE — Assessment & Plan Note (Signed)
 HgA1c 5.8 today which was discussed with patient. This is stable and will continue to watch.

## 2024-01-22 LAB — CERVICOVAGINAL ANCILLARY ONLY
Chlamydia: NEGATIVE
Comment: NEGATIVE
Comment: NORMAL
Neisseria Gonorrhea: NEGATIVE

## 2024-01-23 ENCOUNTER — Ambulatory Visit: Payer: Self-pay

## 2024-01-23 NOTE — Telephone Encounter (Signed)
 Letter sent to patient discussing results from recent office visit.

## 2024-02-01 ENCOUNTER — Ambulatory Visit: Payer: Self-pay

## 2024-03-25 ENCOUNTER — Other Ambulatory Visit: Payer: Self-pay

## 2024-03-25 MED ORDER — CETIRIZINE HCL 10 MG PO TABS: ORAL_TABLET | ORAL | 2 refills | Status: AC

## 2024-03-25 NOTE — Progress Notes (Deleted)
" ° ° °  SUBJECTIVE:   CHIEF COMPLAINT / HPI:   Vaginal Issues ***  PERTINENT  PMH / PSH: Depression, Diabetes, HLD, Anxiety, Asthma  OBJECTIVE:   There were no vitals taken for this visit.  General: Awake and Alert in NAD HEENT: NCAT. Sclera anicteric. No rhinorrhea. Cardiovascular: RRR. No M/R/G Respiratory: CTAB, normal WOB on RA. No wheezing, crackles, rhonchi, or diminished breath sounds. Abdomen: Soft, non-tender, non-distended. Bowel sounds normoactive/hypoactive/hyperactive. *** Extremities: Able to move all extremities. No BLE edema, no deformities or significant joint findings. Skin: Warm and dry. No abrasions or rashes noted. Neuro: A&Ox***. No focal neurological deficits.  ASSESSMENT/PLAN:   Assessment & Plan      Kathrine Melena, DO [facility] "

## 2024-03-26 ENCOUNTER — Ambulatory Visit: Payer: Self-pay | Admitting: Family Medicine

## 2024-04-10 ENCOUNTER — Ambulatory Visit: Payer: MEDICAID | Admitting: Family Medicine
